# Patient Record
Sex: Female | Born: 1945
Health system: Southern US, Community
[De-identification: ages and names within clinical notes are randomized; demographics above are authoritative.]

## PROBLEM LIST (undated history)

## (undated) DIAGNOSIS — E119 Type 2 diabetes mellitus without complications: Secondary | ICD-10-CM

## (undated) DIAGNOSIS — R32 Unspecified urinary incontinence: Secondary | ICD-10-CM

## (undated) DIAGNOSIS — F79 Unspecified intellectual disabilities: Secondary | ICD-10-CM

## (undated) DIAGNOSIS — K5900 Constipation, unspecified: Secondary | ICD-10-CM

## (undated) DIAGNOSIS — R296 Repeated falls: Secondary | ICD-10-CM

## (undated) DIAGNOSIS — M8580 Other specified disorders of bone density and structure, unspecified site: Secondary | ICD-10-CM

## (undated) DIAGNOSIS — R159 Full incontinence of feces: Secondary | ICD-10-CM

## (undated) DIAGNOSIS — R569 Unspecified convulsions: Secondary | ICD-10-CM

## (undated) DIAGNOSIS — R269 Unspecified abnormalities of gait and mobility: Secondary | ICD-10-CM

## (undated) DIAGNOSIS — M1990 Unspecified osteoarthritis, unspecified site: Secondary | ICD-10-CM

## (undated) DIAGNOSIS — C801 Malignant (primary) neoplasm, unspecified: Secondary | ICD-10-CM

## (undated) HISTORY — DX: Unspecified convulsions: R56.9

## (undated) HISTORY — PX: ABDOMINAL HYSTERECTOMY: SHX81

## (undated) HISTORY — DX: Unspecified osteoarthritis, unspecified site: M19.90

## (undated) HISTORY — DX: Type 2 diabetes mellitus without complications: E11.9

## (undated) HISTORY — DX: Repeated falls: R29.6

## (undated) HISTORY — DX: Malignant (primary) neoplasm, unspecified: C80.1

---

## 1997-07-06 ENCOUNTER — Ambulatory Visit (HOSPITAL_BASED_OUTPATIENT_CLINIC_OR_DEPARTMENT_OTHER): Admission: RE | Admit: 1997-07-06 | Discharge: 1997-07-06 | Payer: Self-pay | Admitting: General Surgery

## 1999-07-22 ENCOUNTER — Encounter: Admission: RE | Admit: 1999-07-22 | Discharge: 1999-07-22 | Payer: Self-pay | Admitting: Gynecology

## 1999-08-27 ENCOUNTER — Encounter: Payer: Self-pay | Admitting: Family Medicine

## 1999-08-27 ENCOUNTER — Encounter: Admission: RE | Admit: 1999-08-27 | Discharge: 1999-08-27 | Payer: Self-pay | Admitting: Family Medicine

## 2012-04-16 ENCOUNTER — Other Ambulatory Visit: Payer: Self-pay | Admitting: Family Medicine

## 2012-04-16 DIAGNOSIS — Z1231 Encounter for screening mammogram for malignant neoplasm of breast: Secondary | ICD-10-CM

## 2012-05-21 ENCOUNTER — Ambulatory Visit: Payer: Self-pay

## 2012-06-21 ENCOUNTER — Ambulatory Visit: Payer: Self-pay

## 2012-07-12 ENCOUNTER — Ambulatory Visit: Payer: Self-pay

## 2013-01-07 ENCOUNTER — Encounter (INDEPENDENT_AMBULATORY_CARE_PROVIDER_SITE_OTHER): Payer: Self-pay

## 2013-01-07 ENCOUNTER — Encounter (INDEPENDENT_AMBULATORY_CARE_PROVIDER_SITE_OTHER): Payer: Self-pay | Admitting: General Surgery

## 2013-01-07 ENCOUNTER — Ambulatory Visit (INDEPENDENT_AMBULATORY_CARE_PROVIDER_SITE_OTHER): Payer: Medicare Other | Admitting: General Surgery

## 2013-01-07 VITALS — BP 108/77 | Temp 98.7°F | Ht <= 58 in | Wt 113.6 lb

## 2013-01-07 DIAGNOSIS — N6320 Unspecified lump in the left breast, unspecified quadrant: Secondary | ICD-10-CM

## 2013-01-07 DIAGNOSIS — N63 Unspecified lump in unspecified breast: Secondary | ICD-10-CM

## 2013-01-07 NOTE — Patient Instructions (Signed)
Will need to talk to the guardian and decide how to proceed further.

## 2013-01-07 NOTE — Progress Notes (Addendum)
Patient ID: Paula GUIRGUIS, female   DOB: 1945-10-27, 67 y.o.   MRN: [id number]  Chief Complaint  Patient presents with  . New Evaluation    eval lt br    HPI Paula Cantu is a 67 y.o. female.   HPI   She is referred by Dr. Katina Dung for further evaluation of a new left breast mass. She has severe profound mental retardation and lives in [facility]. She is here with 2 aides. A report that she had a physical in August and this mass was not noted. She was getting a bath and this was noticed at that time. They report that she will not lie down for an examination. She would not allow herself to be stuck within the needles. She will not be able to have a mammogram. She has a guardian who lives in [place]. She has a history of endometrial cancer. She has been on tamoxifen and Megace in the past but is is not clear to me why.  Given her medical condition, and no other history is obtainable.  Past Medical History  Diagnosis Date  . Arthritis   . Diabetes mellitus without complication   . Cancer     endometrial  . Seizures      Intermittent explosive disorder   Severe/profound mental retardation   Chronic constipation   Osteoarthritis  Past Surgical History  Procedure Laterality Date  . Abdominal hysterectomy      History reviewed. No pertinent family history.  Social History History  Substance Use Topics  . Smoking status: Never Smoker   . Smokeless tobacco: Never Used  . Alcohol Use: No    Not on File  Current Outpatient Prescriptions  Medication Sig Dispense Refill  . calcium carbonate (OS-CAL - DOSED IN MG OF ELEMENTAL CALCIUM) 1250 MG tablet Take 1 tablet by mouth.      . loratadine (CLARITIN) 10 MG tablet Take 10 mg by mouth daily.      . metFORMIN (GLUCOPHAGE) 850 MG tablet Take 850 mg by mouth daily with breakfast.      . senna (SENEXON) 8.6 MG tablet Take 1 tablet by mouth daily.       No current facility-administered medications for this visit.     Review of Systems Review of Systems  Unable to perform ROS   Blood pressure 108/77, temperature 98.7 F (37.1 C), temperature source Temporal, height 4' 8.5" (1.435 m), weight 113 lb 9.6 oz (51.529 kg).  Physical Exam Physical Exam  Constitutional: She appears well-developed and well-nourished.  Very agitated but pleasant.  HENT:  Head: Normocephalic and atraumatic.  Neck: Neck supple.  Pulmonary/Chest:  Left breast demonstrates a 5 cm x 5 cm firm mass in the superior aspect. It is relatively fixed. No erythema over it. A limited ultrasound suggest a solid mass. No nipple discharge.  Right breast is without masses.  Musculoskeletal:  No supraclavicular or axillary adenopathy.  Lymphadenopathy:    She has no cervical adenopathy.    Data Reviewed One note from a physician in March of this year.  Assessment    New onset large fixed left breast mass clinically suspicious for malignancy. The patient will not allow blood draws, would not allow a needle biopsy, and we'll not allow a mammogram. This is due to her mental status. This lead to a very difficult situation. Ideally, she would have a mammogram with ultrasound image guided biopsy. Further treatment will be based on that.     Plan  I am going to speak with her guardian in [place] and discussed the situation with her. We will try to obtain records from her oncologist in [place]. Also try to have a discussion with the person as well. Further evaluation and treatment based on above discussions.        Justeen Hehr J 01/07/2013, 4:32 PM

## 2013-01-20 ENCOUNTER — Encounter (INDEPENDENT_AMBULATORY_CARE_PROVIDER_SITE_OTHER): Payer: Self-pay | Admitting: General Surgery

## 2013-01-20 NOTE — Progress Notes (Signed)
Patient ID: Paula Cantu, female   DOB: 26-Oct-1945, 67 y.o.   MRN: [id number] Either I. Nor the step of the care center have been able to hold of the guardian of Ms. Newville. A certified letter has been sent to her address but no response has been received as of yet. If we cannot find the guardian, I will speak with the social worker of [place] to see if we need to get a guardian locally so that we can proceed with treatment.

## 2013-01-25 ENCOUNTER — Encounter (INDEPENDENT_AMBULATORY_CARE_PROVIDER_SITE_OTHER): Payer: Self-pay | Admitting: General Surgery

## 2013-01-25 ENCOUNTER — Telehealth (INDEPENDENT_AMBULATORY_CARE_PROVIDER_SITE_OTHER): Payer: Self-pay

## 2013-01-25 NOTE — Progress Notes (Signed)
Patient ID: Paula Cantu, female   DOB: 04/12/1945, 67 y.o.   MRN: [id number] I was able to get a hold of Evangeline Dakin, her guardian. I discussed the situation with her in detail. She gave me some background on [name]. She feels that she will not tolerate [place] doing a whole lot to her. I recommended  a lumpectomy and then possibly endocrine therapy if this was breast cancer that was ER PR positive. I discussed the procedure and the risks with her. Risks include but limited to bleeding, infection, wound healing problems from anesthesia, possibility of positive margins. She lives in  [place] and is taking care of her son who has lung cancer. She has not seen [name] in 2 years. I told her I let her think about it and then I will call her back to see if she elects to proceed. If so we will need to arrange for a phone/verbal consent to be done.

## 2013-01-25 NOTE — Telephone Encounter (Signed)
Rhonda at care facility called to give updated guardian info to Dr Abbey Chatters. Pt's guardian is Evangeline Dakin and can be reached at [phone]. She states Ms Dan Humphreys is aware MD will be calling her. I advised Bjorn Loser this info will be sent to Dr Abbey Chatters and his assistant.

## 2013-01-27 ENCOUNTER — Telehealth (INDEPENDENT_AMBULATORY_CARE_PROVIDER_SITE_OTHER): Payer: Self-pay

## 2013-01-27 NOTE — Telephone Encounter (Signed)
Per Warren Lacy, Risk Management at [facility], verbal consent can be obtained from pt's guardian in [place] by telephone for pt's surgery.  Dr. Abbey Chatters and one witness listening in is required.  Telephone call to be documented in Epic.

## 2013-01-28 ENCOUNTER — Telehealth (INDEPENDENT_AMBULATORY_CARE_PROVIDER_SITE_OTHER): Payer: Self-pay

## 2013-01-28 NOTE — Telephone Encounter (Signed)
Made pt's guardian, Evangeline Dakin, aware of pt's appt 02/16/13 with Dr. Abbey Chatters.  She will be available by phone from 11:00am until 12:00pm our time [place]) to give verbal consent for pt's breast surgery.  All her questions will be answered at that time.

## 2013-02-16 ENCOUNTER — Encounter (INDEPENDENT_AMBULATORY_CARE_PROVIDER_SITE_OTHER): Payer: Self-pay | Admitting: General Surgery

## 2013-02-16 ENCOUNTER — Ambulatory Visit (INDEPENDENT_AMBULATORY_CARE_PROVIDER_SITE_OTHER): Payer: Medicare Other | Admitting: General Surgery

## 2013-02-16 ENCOUNTER — Telehealth (INDEPENDENT_AMBULATORY_CARE_PROVIDER_SITE_OTHER): Payer: Self-pay | Admitting: General Surgery

## 2013-02-16 ENCOUNTER — Encounter (INDEPENDENT_AMBULATORY_CARE_PROVIDER_SITE_OTHER): Payer: Self-pay

## 2013-02-16 VITALS — HR 60 | Temp 97.8°F | Resp 18 | Ht 59.0 in | Wt 116.2 lb

## 2013-02-16 DIAGNOSIS — N63 Unspecified lump in unspecified breast: Secondary | ICD-10-CM | POA: Insufficient documentation

## 2013-02-16 NOTE — Patient Instructions (Signed)
We will call to schedule the operation.

## 2013-02-16 NOTE — Progress Notes (Signed)
Patient ID: Paula Cantu, female   DOB: [birth year], 67 y.o.   MRN: [id number]  Chief Complaint  Patient presents with  . Breast Problem    HPI Paula Cantu is a 67 y.o. female.   HPI  She is here for followup of her left breast mass. She is with a caretaker from the [facility] facility.  Past Medical History  Diagnosis Date  . Arthritis   . Diabetes mellitus without complication   . Cancer     endometrial  . Seizures     Past Surgical History  Procedure Laterality Date  . Abdominal hysterectomy      History reviewed. No pertinent family history.  Social History History  Substance Use Topics  . Smoking status: Never Smoker   . Smokeless tobacco: Never Used  . Alcohol Use: No    Allergies  Allergen Reactions  . Bacillus   . Lactose Intolerance (Gi)   . Ppd [Tuberculin Purified Protein Derivative]     Current Outpatient Prescriptions  Medication Sig Dispense Refill  . calcium carbonate (OS-CAL - DOSED IN MG OF ELEMENTAL CALCIUM) 1250 MG tablet Take 1 tablet by mouth.      . loratadine (CLARITIN) 10 MG tablet Take 10 mg by mouth daily.      . metFORMIN (GLUCOPHAGE) 850 MG tablet Take 850 mg by mouth daily with breakfast.      . senna (SENEXON) 8.6 MG tablet Take 1 tablet by mouth daily.       No current facility-administered medications for this visit.    Review of Systems Review of Systems  Unable to perform ROS   Pulse 60, temperature 97.8 F (36.6 C), temperature source Temporal, resp. rate 18, height 4\' 11"  (1.499 m), weight 116 lb 3.2 oz (52.708 kg).  Physical Exam Physical Exam  Constitutional:  She appears very agitated.  Cardiovascular: Regular rhythm.   Increased rate  Pulmonary/Chest:  5 cm slightly mobile superior left breast mass.  Musculoskeletal:  No left axillary adenopathy, no left supraclavicular adenopathy    Data Reviewed Old  Assessment    Left breast mass. I've recommended removal of this.  I spoke with her guardian,  Evangeline Dakin, while we were in the room and the presence of the caretaker and Britt Bottom, [place] student.  We discussed the procedure and risks once again and she gave her consent over the phone.  This was witnessed by the other 2 parties in the room.    Plan    Schedule outpatient left breast lumpectomy. This may be a challenge from an anesthesia standpoint.        Agapito Hanway J 02/16/2013, 12:43 PM

## 2013-02-16 NOTE — Telephone Encounter (Signed)
Received call from Judeen Hammans, former case Production designer, theatre/television/film and nurse for this pt.  She has researched pt's records for surgery report from her hysterectomy in 2006.  This nurse accompanied her pt to the preop holding area, because of the pt's degree of anxiety.  The record shows she was given chloral hydrate then, and soon later 10 mg Valium, followed finally by a "ketamine cocktail."  Pt finally became totally sedated and surgery with endotrachial general anesthesia proceeded.  Bjorn Loser will try to scan and email this report, but the condition of it is too poor to FAX.

## 2013-02-17 ENCOUNTER — Encounter (HOSPITAL_COMMUNITY): Payer: Self-pay | Admitting: Pharmacy Technician

## 2013-02-24 ENCOUNTER — Encounter (HOSPITAL_COMMUNITY): Payer: Self-pay | Admitting: Vascular Surgery

## 2013-02-24 NOTE — Progress Notes (Signed)
Anesthesia Chart Review:  Patient is a 67 year old female scheduled for left breast lumpectomy on 03/04/13 by Dr. Abbey Chatters.  Breast lump is worrisome for cancer, although it has yet to be proven.  Patient is scheduled to be a same day work-up.  Her history includes severe intellectual disability, non-verbal, DM2, endometrial cancer s/p hysterectomy with BSO 2008 [facility]), arthritis, remote history of seizure (possible childhood febrile seizure; not requiring long-term anti-seizure medication). BMI is listed as 23.46, 52.7 kg as of 02/16/13. Patient is fearful and uncooperative of procedures.  Notes indicate that she was not willing to lie down for an exam, undergo a mammogram, and is particularly uncooperative with needle sticks.  She is a resident at [facility] in [place].  PCP is Dr. Katina Dung.  Her legal guardian is Evangeline Dakin [phone]) who is a cousin that lives in [place].  (Copy of [place] guardianship letter of appointment received from [facility].) Today I spoke with one of patient's nurses Bjorn Loser, who has been involved in her care for the past ten years (first as a CSW and now as a Engineer, civil (consulting)).  Bjorn Loser reports that patient's glucose is checked twice weekly and typically runs 100-150.  There is no known cardiac issues.  No seizures since she has known patient.    I am requesting her 2008 anesthesia record, but according to the operative note, she was given chloral hydrate then Valium 10 mg followed by a "ketamine cocktail." Once she was sedated enough an IV was started, appropriate labs drawn, and CXR, and EKG were done. She then was taken to the operating room for procedure under general anesthesia.    Anesthesiologist Dr. Jacklynn Bue spoke with Evangeline Dakin over the phone this afternoon for anesthesia consent.  Two nurses were also able to verify surgical consent.  Evangeline Dakin stated to please feel free to contact her at any time for questions.  Velna Ochs [facility]/Anesthesiology Phone [phone] 02/24/2013 3:31 PM

## 2013-03-03 ENCOUNTER — Encounter (HOSPITAL_COMMUNITY): Payer: Self-pay | Admitting: *Deleted

## 2013-03-03 MED ORDER — CEFAZOLIN SODIUM-DEXTROSE 2-3 GM-% IV SOLR
2.0000 g | INTRAVENOUS | Status: AC
  Administered 2013-03-04: 2 g via INTRAVENOUS
  Filled 2013-03-03: qty 50

## 2013-03-03 NOTE — Progress Notes (Signed)
Spoke with Leandro Reasoner, nurse for Ms. Capetillo. Pt is mentally challenged and unable to speak for herself. Roxanne verified health hx, allergies and meds. She states that pt is usually not cooperative with medical procedures and the group home will be sending several people to help with her. OR consent has been signed previously by guardian, it is on pt's chart. Informed Roxanne that pt needed to be here at 5:30 am tomorrow (12/19) and that she is to be NPO after midnight tonight, but may have her Claritin with a little sip of water in the am before surgery. Pt not to take any other medications in the am.

## 2013-03-04 ENCOUNTER — Encounter (HOSPITAL_COMMUNITY): Payer: Self-pay | Admitting: Anesthesiology

## 2013-03-04 ENCOUNTER — Encounter (HOSPITAL_COMMUNITY)
Admission: RE | Disposition: A | Discharge status: Home / Self Care | Payer: Self-pay | Source: Ambulatory Visit | Attending: General Surgery

## 2013-03-04 ENCOUNTER — Ambulatory Visit (HOSPITAL_COMMUNITY): Payer: Medicare Other | Admitting: Certified Registered Nurse Anesthetist

## 2013-03-04 ENCOUNTER — Ambulatory Visit (HOSPITAL_COMMUNITY)
Admission: RE | Admit: 2013-03-04 | Discharge: 2013-03-04 | Disposition: A | Discharge status: Home / Self Care | Payer: Medicare Other | Source: Ambulatory Visit | Attending: General Surgery | Admitting: General Surgery

## 2013-03-04 ENCOUNTER — Encounter (HOSPITAL_COMMUNITY): Payer: Medicare Other | Admitting: Certified Registered Nurse Anesthetist

## 2013-03-04 DIAGNOSIS — C50919 Malignant neoplasm of unspecified site of unspecified female breast: Secondary | ICD-10-CM

## 2013-03-04 DIAGNOSIS — E119 Type 2 diabetes mellitus without complications: Secondary | ICD-10-CM | POA: Insufficient documentation

## 2013-03-04 DIAGNOSIS — Z8542 Personal history of malignant neoplasm of other parts of uterus: Secondary | ICD-10-CM | POA: Insufficient documentation

## 2013-03-04 DIAGNOSIS — Z9079 Acquired absence of other genital organ(s): Secondary | ICD-10-CM | POA: Insufficient documentation

## 2013-03-04 DIAGNOSIS — F72 Severe intellectual disabilities: Secondary | ICD-10-CM | POA: Insufficient documentation

## 2013-03-04 HISTORY — PX: BREAST LUMPECTOMY: SHX2

## 2013-03-04 HISTORY — DX: Unspecified intellectual disabilities: F79

## 2013-03-04 HISTORY — DX: Other specified disorders of bone density and structure, unspecified site: M85.80

## 2013-03-04 LAB — GLUCOSE, CAPILLARY: Glucose-Capillary: 115 mg/dL — ABNORMAL HIGH (ref 70–99)

## 2013-03-04 SURGERY — BREAST LUMPECTOMY
Anesthesia: General | Laterality: Left

## 2013-03-04 MED ORDER — 0.9 % SODIUM CHLORIDE (POUR BTL) OPTIME
TOPICAL | Status: DC | PRN
  Administered 2013-03-04: 1000 mL

## 2013-03-04 MED ORDER — HYDROCODONE-ACETAMINOPHEN 5-325 MG PO TABS: 1.0000 | ORAL_TABLET | ORAL | Status: DC | PRN

## 2013-03-04 MED ORDER — LIDOCAINE HCL (CARDIAC) 20 MG/ML IV SOLN
INTRAVENOUS | Status: DC | PRN
  Administered 2013-03-04: 70 mg via INTRAVENOUS

## 2013-03-04 MED ORDER — KETAMINE HCL 100 MG/ML IJ SOLN
INTRAMUSCULAR | Status: AC
  Filled 2013-03-04: qty 1

## 2013-03-04 MED ORDER — HYDROMORPHONE HCL PF 1 MG/ML IJ SOLN: 0.2500 mg | INTRAMUSCULAR | Status: DC | PRN

## 2013-03-04 MED ORDER — PHENYLEPHRINE HCL 10 MG/ML IJ SOLN
INTRAMUSCULAR | Status: DC | PRN
  Administered 2013-03-04: 120 ug via INTRAVENOUS

## 2013-03-04 MED ORDER — BUPIVACAINE HCL (PF) 0.25 % IJ SOLN
INTRAMUSCULAR | Status: AC
  Filled 2013-03-04: qty 30

## 2013-03-04 MED ORDER — ONDANSETRON HCL 4 MG/2ML IJ SOLN
INTRAMUSCULAR | Status: DC | PRN
  Administered 2013-03-04: 4 mg via INTRAVENOUS

## 2013-03-04 MED ORDER — ONDANSETRON HCL 4 MG/2ML IJ SOLN
4.0000 mg | Freq: Once | INTRAMUSCULAR | Status: DC | PRN
Start: 2013-03-04 — End: 2013-03-04

## 2013-03-04 MED ORDER — OXYCODONE HCL 5 MG/5ML PO SOLN: 5.0000 mg | Freq: Once | ORAL | Status: DC | PRN

## 2013-03-04 MED ORDER — OXYCODONE HCL 5 MG PO TABS: 5.0000 mg | ORAL_TABLET | Freq: Once | ORAL | Status: DC | PRN

## 2013-03-04 MED ORDER — PROPOFOL 10 MG/ML IV BOLUS
INTRAVENOUS | Status: DC | PRN
  Administered 2013-03-04: 130 mg via INTRAVENOUS

## 2013-03-04 MED ORDER — FENTANYL CITRATE 0.05 MG/ML IJ SOLN
INTRAMUSCULAR | Status: DC | PRN
  Administered 2013-03-04: 100 ug via INTRAVENOUS

## 2013-03-04 MED ORDER — BUPIVACAINE HCL (PF) 0.25 % IJ SOLN
INTRAMUSCULAR | Status: DC | PRN
  Administered 2013-03-04: 20 mL

## 2013-03-04 MED ORDER — KETAMINE HCL 100 MG/ML IJ SOLN
INTRAMUSCULAR | Status: DC | PRN
  Administered 2013-03-04: 250 mg via INTRAMUSCULAR

## 2013-03-04 MED ORDER — HYDROMORPHONE HCL PF 1 MG/ML IJ SOLN
INTRAMUSCULAR | Status: AC
  Filled 2013-03-04: qty 1

## 2013-03-04 MED ORDER — SUCCINYLCHOLINE CHLORIDE 20 MG/ML IJ SOLN
INTRAMUSCULAR | Status: DC | PRN
  Administered 2013-03-04: 120 mg via INTRAVENOUS

## 2013-03-04 MED ORDER — LACTATED RINGERS IV SOLN
INTRAVENOUS | Status: DC | PRN
  Administered 2013-03-04 (×2): via INTRAVENOUS

## 2013-03-04 MED ORDER — ONDANSETRON HCL 4 MG/2ML IJ SOLN: 4.0000 mg | Freq: Once | INTRAMUSCULAR | Status: DC | PRN

## 2013-03-04 SURGICAL SUPPLY — 44 items
BENZOIN TINCTURE PRP APPL 2/3 (GAUZE/BANDAGES/DRESSINGS) ×2 IMPLANT
BINDER BREAST LRG (GAUZE/BANDAGES/DRESSINGS) IMPLANT
BINDER BREAST XLRG (GAUZE/BANDAGES/DRESSINGS) IMPLANT
BLADE SURG 10 STRL SS (BLADE) ×2 IMPLANT
BLADE SURG 15 STRL LF DISP TIS (BLADE) ×1 IMPLANT
BLADE SURG 15 STRL SS (BLADE) ×1
CANISTER SUCTION 2500CC (MISCELLANEOUS) ×2 IMPLANT
CHLORAPREP W/TINT 26ML (MISCELLANEOUS) ×2 IMPLANT
CONT SPEC 4OZ CLIKSEAL STRL BL (MISCELLANEOUS) ×2 IMPLANT
COVER SURGICAL LIGHT HANDLE (MISCELLANEOUS) ×2 IMPLANT
DECANTER SPIKE VIAL GLASS SM (MISCELLANEOUS) ×2 IMPLANT
DRAPE PED LAPAROTOMY (DRAPES) ×2 IMPLANT
DRAPE UTILITY 15X26 W/TAPE STR (DRAPE) ×4 IMPLANT
DRSG OPSITE 4X5.5 SM (GAUZE/BANDAGES/DRESSINGS) ×2 IMPLANT
ELECT CAUTERY BLADE 6.4 (BLADE) ×2 IMPLANT
ELECT REM PT RETURN 9FT ADLT (ELECTROSURGICAL) ×2
ELECTRODE REM PT RTRN 9FT ADLT (ELECTROSURGICAL) ×1 IMPLANT
GLOVE BIOGEL PI IND STRL 8 (GLOVE) ×1 IMPLANT
GLOVE BIOGEL PI INDICATOR 8 (GLOVE) ×1
GLOVE ECLIPSE 8.0 STRL XLNG CF (GLOVE) ×2 IMPLANT
GLOVE SURG SS PI 7.0 STRL IVOR (GLOVE) ×4 IMPLANT
GOWN STRL NON-REIN LRG LVL3 (GOWN DISPOSABLE) ×4 IMPLANT
KIT BASIN OR (CUSTOM PROCEDURE TRAY) ×2 IMPLANT
KIT ROOM TURNOVER OR (KITS) ×2 IMPLANT
NEEDLE 18GX1X1/2 (RX/OR ONLY) (NEEDLE) IMPLANT
NEEDLE HYPO 25GX1X1/2 BEV (NEEDLE) ×2 IMPLANT
NS IRRIG 1000ML POUR BTL (IV SOLUTION) ×2 IMPLANT
PACK SURGICAL SETUP 50X90 (CUSTOM PROCEDURE TRAY) ×2 IMPLANT
PAD ARMBOARD 7.5X6 YLW CONV (MISCELLANEOUS) ×4 IMPLANT
PENCIL BUTTON HOLSTER BLD 10FT (ELECTRODE) ×2 IMPLANT
SPONGE GAUZE 4X4 12PLY (GAUZE/BANDAGES/DRESSINGS) ×2 IMPLANT
SPONGE LAP 18X18 X RAY DECT (DISPOSABLE) ×2 IMPLANT
SPONGE LAP 4X18 X RAY DECT (DISPOSABLE) ×2 IMPLANT
STRIP CLOSURE SKIN 1/2X4 (GAUZE/BANDAGES/DRESSINGS) ×2 IMPLANT
SUT MNCRL AB 4-0 PS2 18 (SUTURE) ×2 IMPLANT
SUT SILK 3 0 SH 30 (SUTURE) ×2 IMPLANT
SUT VIC AB 3-0 SH 18 (SUTURE) ×2 IMPLANT
SYR CONTROL 10ML LL (SYRINGE) ×2 IMPLANT
TAPE CLOTH SURG 4X10 WHT LF (GAUZE/BANDAGES/DRESSINGS) ×2 IMPLANT
TOWEL OR 17X24 6PK STRL BLUE (TOWEL DISPOSABLE) ×2 IMPLANT
TOWEL OR 17X26 10 PK STRL BLUE (TOWEL DISPOSABLE) ×2 IMPLANT
TUBE CONNECTING 12X1/4 (SUCTIONS) ×2 IMPLANT
WATER STERILE IRR 1000ML POUR (IV SOLUTION) IMPLANT
YANKAUER SUCT BULB TIP NO VENT (SUCTIONS) ×2 IMPLANT

## 2013-03-04 NOTE — H&P (View-Only) (Signed)
Patient ID: Paula Cantu, female   DOB: 11/16/1945, 67 y.o.   MRN: [id number]  Chief Complaint  Patient presents with  . Breast Problem    HPI Paula Cantu is a 67 y.o. female.   HPI  She is here for followup of her left breast mass. She is with a caretaker from the [facility] facility.  Past Medical History  Diagnosis Date  . Arthritis   . Diabetes mellitus without complication   . Cancer     endometrial  . Seizures     Past Surgical History  Procedure Laterality Date  . Abdominal hysterectomy      History reviewed. No pertinent family history.  Social History History  Substance Use Topics  . Smoking status: Never Smoker   . Smokeless tobacco: Never Used  . Alcohol Use: No    Allergies  Allergen Reactions  . Bacillus   . Lactose Intolerance (Gi)   . Ppd [Tuberculin Purified Protein Derivative]     Current Outpatient Prescriptions  Medication Sig Dispense Refill  . calcium carbonate (OS-CAL - DOSED IN MG OF ELEMENTAL CALCIUM) 1250 MG tablet Take 1 tablet by mouth.      . loratadine (CLARITIN) 10 MG tablet Take 10 mg by mouth daily.      . metFORMIN (GLUCOPHAGE) 850 MG tablet Take 850 mg by mouth daily with breakfast.      . senna (SENEXON) 8.6 MG tablet Take 1 tablet by mouth daily.       No current facility-administered medications for this visit.    Review of Systems Review of Systems  Unable to perform ROS   Pulse 60, temperature 97.8 F (36.6 C), temperature source Temporal, resp. rate 18, height 4' 11" (1.499 m), weight 116 lb 3.2 oz (52.708 kg).  Physical Exam Physical Exam  Constitutional:  She appears very agitated.  Cardiovascular: Regular rhythm.   Increased rate  Pulmonary/Chest:  5 cm slightly mobile superior left breast mass.  Musculoskeletal:  No left axillary adenopathy, no left supraclavicular adenopathy    Data Reviewed Old  Assessment    Left breast mass. I've recommended removal of this.  I spoke with her guardian,  Sandra Walker, while we were in the room and the presence of the caretaker and Jamie Seals, [employer] student.  We discussed the procedure and risks once again and she gave her consent over the phone.  This was witnessed by the other 2 parties in the room.    Plan    Schedule outpatient left breast lumpectomy. This may be a challenge from an anesthesia standpoint.        Remonia Otte J 02/16/2013, 12:43 PM    

## 2013-03-04 NOTE — Interval H&P Note (Signed)
History and Physical Interval Note:  03/04/2013 7:34 AM  Paula Cantu  has presented today for surgery, with the diagnosis of left breast mass  The various methods of treatment have been discussed with the patient and family. After consideration of risks, benefits and other options for treatment, the patient has consented to  Procedure(s): LEFT BREAST LUMPECTOMY (Left) as a surgical intervention .  The patient's history has been reviewed, patient examined, no change in status, stable for surgery.  I have reviewed the patient's chart and labs.  Questions were answered to the patient's satisfaction.     Darik Massing Shela Commons

## 2013-03-04 NOTE — Anesthesia Procedure Notes (Signed)
Procedure Name: Intubation Date/Time: 03/04/2013 7:53 AM Performed by: Whitman Hero Pre-anesthesia Checklist: Patient identified, Emergency Drugs available, Suction available and Patient being monitored Patient Re-evaluated:Patient Re-evaluated prior to inductionOxygen Delivery Method: Circle system utilized Preoxygenation: Pre-oxygenation with 100% oxygen Intubation Type: IV induction, Inhalational induction and Rapid sequence Ventilation: Mask ventilation without difficulty Laryngoscope Size: Mac and 3 Grade View: Grade I Tube type: Oral Tube size: 7.5 mm Number of attempts: 1 Airway Equipment and Method: Stylet Placement Confirmation: ETT inserted through vocal cords under direct vision,  breath sounds checked- equal and bilateral and positive ETCO2 Secured at: 20 cm Tube secured with: Tape Dental Injury: Teeth and Oropharynx as per pre-operative assessment  Future Recommendations: Recommend- induction with short-acting agent, and alternative techniques readily available

## 2013-03-04 NOTE — Anesthesia Postprocedure Evaluation (Signed)
  Anesthesia Post-op Note  Patient: Paula Cantu  Procedure(s) Performed: Procedure(s): LEFT BREAST LUMPECTOMY (Left)  Patient Location: PACU  Anesthesia Type:General  Level of Consciousness: awake, alert , patient cooperative and confused  Airway and Oxygen Therapy: Patient Spontanous Breathing and Patient connected to face mask oxygen  Post-op Pain: mild  Post-op Assessment: Post-op Vital signs reviewed  Post-op Vital Signs: Reviewed  Complications: No apparent anesthesia complications

## 2013-03-04 NOTE — Op Note (Signed)
Operative Note  Paula Cantu female 67 y.o. 03/04/2013  PREOPERATIVE DX:  Left breast mass  POSTOPERATIVE DX:  Same  PROCEDURE:  Excision of left breast mass         Surgeon: Adolph Pollack   Assistants: None  Anesthesia: General endotracheal anesthesia  Indications: This is a 67 year old female with severe mental retardation who was referred to me because of a large left breast mass noted on a physical examination. Because of her mental condition, imaging studies and needle biopsies are not able to be performed. The palpable mass is concerning for malignancy. After discussion with her guardian and caretakers we decided to proceed with the above operation.    Procedure Detail:  She was seen in the holding area in the left breast marked with my initial. She was brought to the operating room after being sedated, placed supine on the operating table, and a general anesthetic was given. The left breast was widely sterilely prepped and draped. A timeout was performed.  In the superior aspect of the left breast where the lesion was in incision was made in a transverse fashion. Subcutaneous flaps were raised all around the mass. The mass was very close to the skin thus an ellipse of skin was removed which would be the final anterior margin. I then used electrocautery to excise the mass and grossly get negative margins. This went down to the chest wall. Once the mass was removed it was oriented with silk sutures with a single suture being superior and a double suture being medial. The final anterior margin was sent for pathology. An ink mark was placed on the anterior tissue margin. The specimen was then sent for pathology.  Bleeding was controlled with electrocautery. The wound is then anesthetized with the Marcaine for local anesthetic effect. Irrigation was performed.  Second inspection of the wound demonstrated adequate hemostasis. The subcutaneous tissues and approximated with  interrupted 3-0 Vicryl sutures. The skin was closed with a running 4-0 Monocryl subcuticular stitch. Steri-Strips and sterile dressings were applied.  She tolerated the procedure well without any apparent complications and was taken to the recovery room in satisfactory condition.  Estimated Blood Loss:  100 ml         Drains: none  Blood Given: none          Specimens: Left breast tissue and skin for margin.        Complications:  * No complications entered in OR log *         Disposition: PACU - hemodynamically stable.         Condition: stable

## 2013-03-04 NOTE — Preoperative (Signed)
Beta Blockers   Reason not to administer Beta Blockers:Not Applicable 

## 2013-03-04 NOTE — Transfer of Care (Signed)
Immediate Anesthesia Transfer of Care Note  Patient: Paula Cantu  Procedure(s) Performed: Procedure(s): LEFT BREAST LUMPECTOMY (Left)  Patient Location: PACU  Anesthesia Type:General  Level of Consciousness: sedated and patient cooperative  Airway & Oxygen Therapy: Patient Spontanous Breathing  Post-op Assessment: Report given to PACU RN and Post -op Vital signs reviewed and stable  Post vital signs: Reviewed and stable  Complications: No apparent anesthesia complications

## 2013-03-04 NOTE — Anesthesia Preprocedure Evaluation (Signed)
Anesthesia Evaluation  Patient identified by MRN, date of birth, ID band Patient awake and Patient confused    Reviewed: Allergy & Precautions, H&P , NPO status , Patient's Chart, lab work & pertinent test results  Airway       Dental   Pulmonary          Cardiovascular     Neuro/Psych Seizures -,     GI/Hepatic   Endo/Other  diabetes, Type 2, Oral Hypoglycemic Agents  Renal/GU      Musculoskeletal   Abdominal   Peds  Hematology   Anesthesia Other Findings Severe mental retardation.  ? Hx of MR  Cannot examine pt due to her inability to cooperate.  Reproductive/Obstetrics                           Anesthesia Physical Anesthesia Plan  ASA: IV  Anesthesia Plan: General   Post-op Pain Management:    Induction: Intravenous and Inhalational  Airway Management Planned: LMA  Additional Equipment:   Intra-op Plan:   Post-operative Plan: Extubation in OR  Informed Consent: I have reviewed the patients History and Physical, chart, labs and discussed the procedure including the risks, benefits and alternatives for the proposed anesthesia with the patient or authorized representative who has indicated his/her understanding and acceptance.   Dental advisory given and Consent reviewed with POA  Plan Discussed with: CRNA, Anesthesiologist and Surgeon  Anesthesia Plan Comments: (Dr. Jacklynn Bue previously called for phone consent.)        Anesthesia Quick Evaluation

## 2013-03-07 ENCOUNTER — Telehealth (INDEPENDENT_AMBULATORY_CARE_PROVIDER_SITE_OTHER): Payer: Self-pay | Admitting: General Surgery

## 2013-03-07 NOTE — Telephone Encounter (Signed)
Spoke with her about Paula Cantu's operation.

## 2013-03-08 ENCOUNTER — Telehealth (INDEPENDENT_AMBULATORY_CARE_PROVIDER_SITE_OTHER): Payer: Self-pay

## 2013-03-08 ENCOUNTER — Encounter (HOSPITAL_COMMUNITY): Payer: Self-pay | Admitting: General Surgery

## 2013-03-08 NOTE — Telephone Encounter (Signed)
Patient Niece Dois Davenport) returning Dr. Arne Cleveland phone call regarding pathology results.  Dois Davenport was made aware that patients margins are clear.

## 2013-03-14 ENCOUNTER — Telehealth (INDEPENDENT_AMBULATORY_CARE_PROVIDER_SITE_OTHER): Payer: Self-pay | Admitting: General Surgery

## 2013-03-14 NOTE — Telephone Encounter (Signed)
Joyce Gross, at [employer], calling for this pt's pathology report.  Stated that Dr. Abbey Chatters told them he would be on vacation this week, but the report would be back for review.  She asked for the report to be FAXd to Attn:  Dr. Dayna Barker at [phone]; done.

## 2013-03-24 ENCOUNTER — Encounter (INDEPENDENT_AMBULATORY_CARE_PROVIDER_SITE_OTHER): Payer: Self-pay | Admitting: General Surgery

## 2013-03-24 ENCOUNTER — Ambulatory Visit (INDEPENDENT_AMBULATORY_CARE_PROVIDER_SITE_OTHER): Payer: Medicare Other | Admitting: General Surgery

## 2013-03-24 ENCOUNTER — Other Ambulatory Visit (INDEPENDENT_AMBULATORY_CARE_PROVIDER_SITE_OTHER): Payer: Self-pay

## 2013-03-24 VITALS — Wt 111.6 lb

## 2013-03-24 DIAGNOSIS — C50912 Malignant neoplasm of unspecified site of left female breast: Secondary | ICD-10-CM

## 2013-03-24 DIAGNOSIS — C50919 Malignant neoplasm of unspecified site of unspecified female breast: Secondary | ICD-10-CM

## 2013-03-24 NOTE — Patient Instructions (Signed)
Follow up visit in 3 months. 

## 2013-03-24 NOTE — Progress Notes (Signed)
Procedure:  Left partial mastectomy  Date:  03/04/2013  Pathology:  Invasive breast cancer, margins clear, ER/PR positive, proliferation rate 16%  History:  She is here for her first postoperative visit. I tried to contact her guardian, Ms. Walker, to give her the final results. I have not been able to personally speak with her.  Exam: General- Is in NAD. Left breast-incision is clean and intact. Steri-Strips are present there  Assessment:  Invasive left breast cancer. Given her other situations I do not think she needs any further surgery. I do feel she could benefit from a medical oncology consult and discussion of hormonal therapy.  Plan:  Referral to medical oncology. Return visit 3 months.

## 2013-03-25 ENCOUNTER — Encounter: Payer: Self-pay | Admitting: *Deleted

## 2013-03-25 NOTE — Progress Notes (Signed)
Received referral in EPIC for pt to see Med Onc.  Took paperwork to Dr. Jana Hakim for an appt.  Emailed Arts development officer at [employer] to make her aware.

## 2013-03-28 ENCOUNTER — Telehealth: Payer: Self-pay | Admitting: *Deleted

## 2013-03-28 NOTE — Telephone Encounter (Signed)
Received date and time from Dr. Jana Hakim.  Called and left a message for pt or caregiver to call me back so I can get her scheduled.

## 2013-03-29 ENCOUNTER — Other Ambulatory Visit: Payer: Self-pay | Admitting: Oncology

## 2013-03-29 ENCOUNTER — Telehealth: Payer: Self-pay | Admitting: *Deleted

## 2013-03-29 NOTE — Telephone Encounter (Signed)
Spoke with Zigmund Daniel pts caregiver that the group home pts lives at and confirmed 04/13/13 appt.  Unable to schedule the appt from Dr. Jana Hakim - emailed Audie Clear to enter it for me.  Zigmund Daniel made me aware that this pt will require some special assistance during this visit and she will have 2 nurses with her.  Pt is not going to be able to have her labs drawn here, so requested for me to fax an order to their facility and they will take care of the labs and bring results the day of the visit.  Pt has White Coat Syndrome and cannot be in an exam room as well.  I have placed a note in EPIC to make registration or FA aware of situation and will make everyone in [facility] aware as well.  I will request to see if Dr. Jana Hakim will be willing to see the patient in our family room and bring her back to it as soon as she gets checked in.  I am faxing the before appt letter, welcome packet, intake form to nurse at group home.  I will get with Dr. Jana Hakim for the lab order and then fax it.

## 2013-03-30 ENCOUNTER — Encounter: Payer: Self-pay | Admitting: *Deleted

## 2013-03-30 NOTE — Progress Notes (Signed)
Emailed Michelle at [employer] to make her aware of the appt.  Got paperwork ready to fax.  Gave paperwork to Dr. Jana Hakim for an order for the labs and I will fax all paperwork to the nurses when I get that back.  Taking other paperwork to [facility] for chart.

## 2013-03-31 ENCOUNTER — Encounter: Payer: Self-pay | Admitting: *Deleted

## 2013-03-31 NOTE — Progress Notes (Signed)
Faxed Lab order, before appt letter, welcome packet & intake form to nursing for pt.  Took paperwork to [facility] for chart.

## 2013-04-02 ENCOUNTER — Encounter (HOSPITAL_COMMUNITY): Payer: Self-pay | Admitting: Emergency Medicine

## 2013-04-02 ENCOUNTER — Emergency Department (HOSPITAL_COMMUNITY)
Admission: EM | Admit: 2013-04-02 | Discharge: 2013-04-02 | Disposition: A | Discharge status: Home Health | Payer: Medicare Other | Attending: Emergency Medicine | Admitting: Emergency Medicine

## 2013-04-02 DIAGNOSIS — T819XXA Unspecified complication of procedure, initial encounter: Secondary | ICD-10-CM

## 2013-04-02 DIAGNOSIS — F79 Unspecified intellectual disabilities: Secondary | ICD-10-CM | POA: Insufficient documentation

## 2013-04-02 DIAGNOSIS — E119 Type 2 diabetes mellitus without complications: Secondary | ICD-10-CM | POA: Insufficient documentation

## 2013-04-02 DIAGNOSIS — M129 Arthropathy, unspecified: Secondary | ICD-10-CM | POA: Insufficient documentation

## 2013-04-02 DIAGNOSIS — M899 Disorder of bone, unspecified: Secondary | ICD-10-CM | POA: Insufficient documentation

## 2013-04-02 DIAGNOSIS — R569 Unspecified convulsions: Secondary | ICD-10-CM | POA: Insufficient documentation

## 2013-04-02 DIAGNOSIS — Z79899 Other long term (current) drug therapy: Secondary | ICD-10-CM | POA: Insufficient documentation

## 2013-04-02 DIAGNOSIS — M949 Disorder of cartilage, unspecified: Secondary | ICD-10-CM

## 2013-04-02 DIAGNOSIS — S2000XA Contusion of breast, unspecified breast, initial encounter: Secondary | ICD-10-CM | POA: Insufficient documentation

## 2013-04-02 DIAGNOSIS — C801 Malignant (primary) neoplasm, unspecified: Secondary | ICD-10-CM | POA: Insufficient documentation

## 2013-04-02 DIAGNOSIS — Y838 Other surgical procedures as the cause of abnormal reaction of the patient, or of later complication, without mention of misadventure at the time of the procedure: Secondary | ICD-10-CM | POA: Insufficient documentation

## 2013-04-02 NOTE — ED Notes (Signed)
Unable to obtain pt temp. Pt hitting and screaming when atttempted. Pt skin warm and dry. Care giver also atttempted to obtain temp without success

## 2013-04-02 NOTE — ED Notes (Signed)
Very agitated now, hx mental retardation, companion sitter at bedside with her. Pt will not allow staff to touch her, refused putting on her clothing.she will allow staff to take her vital sign. Noted left breat discoloration and edema.  Will allow staff to assess her.

## 2013-04-02 NOTE — ED Notes (Signed)
Pt would not cooperate long enough to get vital signs

## 2013-04-02 NOTE — ED Notes (Signed)
Caregiver states pt is acting normally. Understands to bring pt back if worsening

## 2013-04-02 NOTE — ED Notes (Signed)
Provider at bedside.  Pt difficult to assess. Sitter at bedside. Pt has steri strips to left breast that are peeling. Bruising noted to left lower breast, no drainage noted.  Incision line slightly red, does not show s/s of tenderness when palpated

## 2013-04-02 NOTE — ED Notes (Signed)
Unable to obtain accurate vitals due to non-cooperative patient.

## 2013-04-02 NOTE — ED Provider Notes (Signed)
CSN: [id number]     Arrival date & time 04/02/13  1251 History   First MD Initiated Contact with Patient 04/02/13 1503     Chief Complaint  Patient presents with  . Breast Problem   (Consider location/radiation/quality/duration/timing/severity/associated sxs/prior Treatment) HPI Comments: Patient with history of MR, recent excision of breast mass 03/02/13 by Dr. Zella Richer -- presents from her facility with acute change in surgical site. Level V caveat due to MR. Area noted to be discolored and more firm today. No drainage, fever, N/V per caregiver.    The history is provided by a caregiver and medical records.    Past Medical History  Diagnosis Date  . Arthritis   . Cancer     endometrial  . Intellectual disability     severe intellectual disability, non-verbal  . Seizures     remote history; possible febrile childhood seizure  . Diabetes mellitus without complication     takes metformin  . Osteopenia    Past Surgical History  Procedure Laterality Date  . Abdominal hysterectomy    . Breast lumpectomy Left 03/04/2013    Procedure: LEFT BREAST LUMPECTOMY;  Surgeon: [name], MD;  Location: [facility];  Service: General;  Laterality: Left;   No family history on file. History  Substance Use Topics  . Smoking status: Never Smoker   . Smokeless tobacco: Never Used  . Alcohol Use: No   OB History   Grav Para Term Preterm Abortions TAB SAB Ect Mult Living                 Review of Systems  Unable to perform ROS: Patient nonverbal    Allergies  Bacillus; Lactose intolerance (gi); and Ppd  Home Medications   Current Outpatient Rx  Name  Route  Sig  Dispense  Refill  . calcium-vitamin D (OSCAL 500/200 D-3) 500-200 MG-UNIT per tablet   Oral   Take 1 tablet by mouth 2 (two) times daily.         Marland Kitchen HYDROcodone-acetaminophen (NORCO/VICODIN) 5-325 MG per tablet   Oral   Take 1-2 tablets by mouth every 4 (four) hours as needed.   30 tablet   0   . loratadine  (CLARITIN) 10 MG tablet   Oral   Take 10 mg by mouth daily.         . metFORMIN (GLUCOPHAGE) 850 MG tablet   Oral   Take 850 mg by mouth daily with breakfast.         . sennosides-docusate sodium (SENOKOT-S) 8.6-50 MG tablet   Oral   Take 2 tablets by mouth daily.          BP 120/74  Pulse 84  Resp 18  SpO2 99% Physical Exam  Nursing note and vitals reviewed. Constitutional: She appears well-developed and well-nourished.  She does not feel warm or febrile. Patient is not cooperative with exam.   HENT:  Head: Normocephalic and atraumatic.  Eyes: Conjunctivae are normal.  Neck: Normal range of motion. Neck supple.  Cardiovascular: Normal rate.   HR approx 100 on exam.   Pulmonary/Chest: No respiratory distress. She exhibits no tenderness.  Surgical site L upper breast with steristrips intact, no active drainage, erythema or warmth. Area is firm to touch surrounding the site. Does not appear infected on exam.   Neurological: She is alert.  Skin: Skin is warm and dry.  Psychiatric: She has a normal mood and affect.    ED Course  Procedures (including critical care time)  Labs Review Labs Reviewed - No data to display Imaging Review No results found.  EKG Interpretation   None      3:04 PM Patient seen and examined. Work-up initiated. Medications ordered.   Vital signs unable to be performed because patient swings her arms and bats away anyone who tries to examine her.   Patient discussed with Dr. Dorna Mai. Will call surgery for reccs.   3:52 PM I spoke with Dr. Redmond Pulling of general surgery. Explained that site does not appear erythematous, is not warm, does not exhibit and drainage. Also patient shows no systemic symptoms of illness including feeling feverish. Asked if he wants to see patient in ED versus close follow-up in clinic next week. He states patient can follow-up in clinic.   Plan: patient to f/u in surgery clinic Monday. Caregiver asked to call office Monday  morning.   Caregiver urged to return with worsening pain, worsening swelling, expanding area of redness or streaking up extremity, fever, or any other concerns. Caregiver verbalizes understanding and agrees with plan.    MDM   1. Post-operative complication    Patient with firmness and discoloration from postop site (surgery on 03/02/13). Discoloration does not appear erythematous or feel warm. It appears more like ecchymosis. Do not suspect cellulitis. No definite abscess felt. No purulent or serous drainage currently coming from the site. Patient does not have any systemic symptoms of illness. I've spoken with surgery who recommends followup early next week. Caregiver agrees with this plan.    Carlisle Cater, PA-C 04/02/13 [phone]

## 2013-04-02 NOTE — ED Notes (Signed)
House manager reports that pt had breast surgery in December. Today when pt woke up noticed left breast with redness and hardness. Surgical area noted with redness and hardened area. Steri strips in place. Unable to obtain BP due to pt fighting.

## 2013-04-02 NOTE — Discharge Instructions (Signed)
Please read and follow all provided instructions.  Your diagnoses today include:  1. Post-operative complication     Tests performed today include:  Vital signs. See below for your results today.   Medications prescribed:   None  Take any prescribed medications only as directed.   Home care instructions:  Wash area gently twice a day with warm soapy water. Do not apply alcohol or hydrogen peroxide.   Follow-up instructions: See your surgeon for follow-up in 48 hours for a recheck. I spoke with Dr. Redmond Pulling on the phone today during your ED visit.   Please follow-up with your primary care provider in the next 1 week for further evaluation of your symptoms. If you do not have a primary care doctor -- see below for referral information.   Return instructions:  Return to the Emergency Department if you have:  Fever  Worsening symptoms  Worsening pain  Worsening swelling  Redness of the skin that moves away from the affected area, especially if it streaks away from the affected area   Any other emergent concerns  Your vital signs today were: BP 120/74   Pulse 84   Resp 18   SpO2 99% If your blood pressure (BP) was elevated above 135/85 this visit, please have this repeated by your doctor within one month. --------------

## 2013-04-03 NOTE — ED Provider Notes (Signed)
Medical screening examination/treatment/procedure(s) were performed by non-physician practitioner and as supervising physician I was immediately available for consultation/collaboration.  EKG Interpretation   None         Saddie Benders. Dorna Mai, MD 04/03/13 5883

## 2013-04-04 ENCOUNTER — Telehealth (INDEPENDENT_AMBULATORY_CARE_PROVIDER_SITE_OTHER): Payer: Self-pay

## 2013-04-04 NOTE — Telephone Encounter (Signed)
The nurse Suanne Marker called to report that the pt had to go to the ER this weekend to have her lumpectomy incision checked.  It was pink and red, hard and swollen.  The ER provider told her it is not infected but it is a complication from surgery.  Their note is in the computer and she called to schedule an appointment.  I spoke to [facility] and she let me put the appointment in for tomorrow on Dr Bertrum Sol urgent clinic.

## 2013-04-05 ENCOUNTER — Encounter (INDEPENDENT_AMBULATORY_CARE_PROVIDER_SITE_OTHER): Payer: Self-pay | Admitting: General Surgery

## 2013-04-05 ENCOUNTER — Ambulatory Visit (INDEPENDENT_AMBULATORY_CARE_PROVIDER_SITE_OTHER): Payer: Medicare Other | Admitting: General Surgery

## 2013-04-05 VITALS — BP 134/82 | HR 76 | Temp 97.6°F | Resp 18 | Ht <= 58 in | Wt 113.2 lb

## 2013-04-05 DIAGNOSIS — Z4889 Encounter for other specified surgical aftercare: Secondary | ICD-10-CM

## 2013-04-05 NOTE — Progress Notes (Signed)
Procedure:  Left partial mastectomy  Date:  03/04/2013  Pathology:  Invasive breast cancer, margins clear, ER/PR positive, proliferation rate 16%  History:  She comes in today because she's had increased swelling and some discoloration of the left breast. She was seen in the emergency department over the weekend. No evidence of infection was seen at that time.  Exam: General- Is in NAD. Left breast-incision is clean and intact.  There is significant swelling and some ecchymosis noted. No erythema or drainage.  Assessment:  Invasive left breast cancer Status post lumpectomy. She has findings consistent with a seroma.  Plan:  Will let the seruma resolve on its own. Return visit 2-3 months.

## 2013-04-05 NOTE — Patient Instructions (Signed)
Wear a support bra as much as possible.

## 2013-04-07 ENCOUNTER — Telehealth: Payer: Self-pay | Admitting: *Deleted

## 2013-04-07 ENCOUNTER — Encounter: Payer: Self-pay | Admitting: *Deleted

## 2013-04-07 NOTE — Telephone Encounter (Signed)
Called and left message for caregiver to call me back about the location of patients reports. (mammo, [place], mri, etc)

## 2013-04-07 NOTE — Telephone Encounter (Signed)
The pt's nurse Zigmund Daniel) called me back and stated that the pt is not cooperative and no mammo &/or [place] has been completed.  She also stated that the pt's guardian gives the consent for pt to have oral pill for therapy and Dr. Jana Hakim would just need to call so she could give him the consent [place].  I told her that I would let Dr. Jana Hakim know.  Placed a note for Dr. Jana Hakim about the consent and about no radiology reports in the chart.

## 2013-04-11 ENCOUNTER — Encounter: Payer: Self-pay | Admitting: *Deleted

## 2013-04-11 NOTE — Progress Notes (Signed)
Finished completing chart.  Added to the spreadsheet.  Placed chart in Dr. Virgie Dad box.

## 2013-04-13 ENCOUNTER — Telehealth: Payer: Self-pay | Admitting: *Deleted

## 2013-04-13 ENCOUNTER — Ambulatory Visit: Payer: Medicare Other

## 2013-04-13 ENCOUNTER — Ambulatory Visit (HOSPITAL_BASED_OUTPATIENT_CLINIC_OR_DEPARTMENT_OTHER): Payer: Medicare Other | Admitting: Oncology

## 2013-04-13 VITALS — Ht <= 58 in | Wt 118.6 lb

## 2013-04-13 DIAGNOSIS — M949 Disorder of cartilage, unspecified: Secondary | ICD-10-CM

## 2013-04-13 DIAGNOSIS — F73 Profound intellectual disabilities: Secondary | ICD-10-CM | POA: Insufficient documentation

## 2013-04-13 DIAGNOSIS — F79 Unspecified intellectual disabilities: Secondary | ICD-10-CM

## 2013-04-13 DIAGNOSIS — M899 Disorder of bone, unspecified: Secondary | ICD-10-CM

## 2013-04-13 DIAGNOSIS — Z170 Estrogen receptor positive status [ER+]: Secondary | ICD-10-CM

## 2013-04-13 DIAGNOSIS — F489 Nonpsychotic mental disorder, unspecified: Secondary | ICD-10-CM

## 2013-04-13 DIAGNOSIS — C50919 Malignant neoplasm of unspecified site of unspecified female breast: Secondary | ICD-10-CM

## 2013-04-13 NOTE — Telephone Encounter (Signed)
appts made and printed...td 

## 2013-04-13 NOTE — Progress Notes (Signed)
[facility]  [phone] [phone]     ID: Paula Cantu OB: 1946-01-18  MR#: [id number]  XYB#:[id number]  PCP: Paula Cantu GYN:   SU: Paula Cantu OTHER MD: Paula Cantu  CHIEF COMPLAINT: Patient is nonverbal  HISTORY OF PRESENT ILLNESS: According to nodes in the staff, a left breast mass was noted during a mass and this was brought to her primary physician's attention, who referred the patient to Dr. Lynwood Cantu. He saw the patient's on 01/07/2013 and noted a 5 cm mass in the superior aspect of the left breast, which seemed relatively fixed. There was no erythema. Very limited ultrasound was obtained and showed this to be solid. There was no evidence of supraclavicular adenopathy.  Needle biopsy was suggested but was not allowed by the patient. Mammography could not be obtained. Dr. Clarise Cantu spoke with the patient's guardian, Paula Cantu, who lives in [place], and discuss the situation with her. Approval was obtained for surgery and this was performed 03/04/2013. The final pathology ([id number]) showed an invasive ductal carcinoma, grade 3, measuring 4 cm, with negative margins (closest 2 mm) estrogen receptor positive at 91%, progesterone receptor positive at 12%, both with strong staining intensity, and a proliferation marker of 19%.  There is a question whether the patient may have received tamoxifen and/or Megace in the past, but I do not have any record regarding that.  The patient's subsequent history is as detailed below  INTERVAL HISTORY: Paula Cantu was seen in the breast clinic on 04/13/2013 accompanied by her caregiver Paula Cantu and her transportation/ caregiver, Paula Cantu.  REVIEW OF SYSTEMS: Unable to obtain from the patient. According to the staff, the patient apparently tolerated the surgery without major complications. However on 04/02/2013, the patient was evaluated in the emergency room because of a change in her  surgical site. According to the record his exam, the site was not erythematous warm, or exhibited any drainage. There were also no systemic symptoms. This was felt possibly to be due to ecchymosis.  PAST MEDICAL HISTORY: Past Medical History  Diagnosis Date  . Arthritis   . Cancer     endometrial  . Intellectual disability     severe intellectual disability, non-verbal  . Seizures     remote history; possible febrile childhood seizure  . Diabetes mellitus without complication     takes metformin  . Osteopenia     PAST SURGICAL HISTORY: Past Surgical History  Procedure Laterality Date  . Abdominal hysterectomy    . Breast lumpectomy Left 03/04/2013    Procedure: LEFT BREAST LUMPECTOMY;  Surgeon: Paula Hollingshead, MD;  Location: [facility];  Service: General;  Laterality: Left;    FAMILY HISTORY No family history on file.  GYNECOLOGIC HISTORY:  Status post hysterectomy  SOCIAL HISTORY:  The patient lives in a group home. Her caregiver, Paula Cantu, lives in [place]. She can be reached at [phone]. She has agreed to oral treatment for this patient.    ADVANCED DIRECTIVES:    HEALTH MAINTENANCE: History  Substance Use Topics  . Smoking status: Never Smoker   . Smokeless tobacco: Never Used  . Alcohol Use: No     Colonoscopy:  PAP:  Bone density:  Lipid panel:  Allergies  Allergen Reactions  . Bacillus Other (See Comments)    unknown  . Lactose Intolerance (Gi) Other (See Comments)    On nursing home records  . Ppd [Tuberculin Purified Protein Derivative] Other (See Comments)  On nursing home records    Current Outpatient Prescriptions  Medication Sig Dispense Refill  . calcium-vitamin D (OSCAL 500/200 D-3) 500-200 MG-UNIT per tablet Take 1 tablet by mouth 2 (two) times daily.      Marland Kitchen HYDROcodone-acetaminophen (NORCO/VICODIN) 5-325 MG per tablet Take 1-2 tablets by mouth every 4 (four) hours as needed.  30 tablet  0  . loratadine (CLARITIN) 10 MG tablet  Take 10 mg by mouth daily.      . metFORMIN (GLUCOPHAGE) 850 MG tablet Take 850 mg by mouth daily with breakfast.      . sennosides-docusate sodium (SENOKOT-S) 8.6-50 MG tablet Take 2 tablets by mouth daily.       No current facility-administered medications for this visit.    OBJECTIVE: Middle-aged white woman examined standing up Filed Vitals:     Body mass index is 24.79 kg/(m^2).    ECOG FS:0 - Asymptomatic  Physical exam was limited by patient's cooperation, but she did allow me to feel her neck and supraclavicular area. She lifted her shirt so that I could examine her breast. The left breast is status post lumpectomy. The incision is healing well. There is no dehiscence, erythema, or unusual swelling or tenderness. Mental status: The patient is nonverbal. She was very pleasant and  I felt was cooperating to the best of her understanding.   LAB RESULTS:  CMP  No results found for this basename: na,  k,  cl,  co2,  glucose,  bun,  creatinine,  calcium,  prot,  albumin,  ast,  alt,  alkphos,  bilitot,  gfrnonaa,  gfraa    I No results found for this basename: SPEP,  UPEP,   kappa and lambda light chains    No results found for this basename: WBC,  NEUTROABS,  HGB,  HCT,  MCV,  PLT      Chemistry   No results found for this basename: NA,  K,  CL,  CO2,  BUN,  CREATININE,  GLU   No results found for this basename: CALCIUM,  ALKPHOS,  AST,  ALT,  BILITOT       No results found for this basename: LABCA2    No components found with this basename: LABCA125    No results found for this basename: INR,  in the last 168 hours  Urinalysis No results found for this basename: colorurine,  appearanceur,  labspec,  phurine,  glucoseu,  hgbur,  bilirubinur,  ketonesur,  proteinur,  urobilinogen,  nitrite,  leukocytesur    STUDIES: No results available do to patient's mental status  ASSESSMENT: 68 y.o[facility] residents status post left lumpectomy with no sentinel lymph  node sampling 03/04/2013 for a pT2 pNX, stage II invasive ductal carcinoma, grade 3, estrogen receptor 91% positive, progesterone receptor 12% positive, with an MIB-1 of 19%.  (1) tamoxifen started 04/13/2013  PLAN: I spent approximately 45 minutes with the patient and her caregivers going over the situation. He is is at risk of both local and systemic recurrence. Given the overall situation, chemotherapy and HER-2 therapy would not be an option (which is the reason HER-2 was not obtained). However her caregiver has agreed to oral treatment and given the fact that the patient has osteopenia and is status post hysterectomy, I think tamoxifen would be a better choice for her than an aromatase inhibitor.  I did write down some of the possible toxicities, side effects and complications of tamoxifen. I suggested they call us if any problems develop.  We will see if again in 4 months, since it takes that long to have attain a steady blood level of tamoxifen. If she tolerates the tamoxifen well the plan will be to follow her every 6 months with physical exam alone.  I am hopeful was a simple intervention [place] will be spared any difficulties from her breast cancer diagnosis. I asked the staff to call us with any questions or concerns that may develop before the next visit.    Chauncey Cruel, MD   04/13/2013 4:51 PM

## 2013-06-17 ENCOUNTER — Telehealth: Payer: Self-pay | Admitting: *Deleted

## 2013-06-17 NOTE — Telephone Encounter (Signed)
Mailed after appt letter to pt. 

## 2013-07-29 ENCOUNTER — Encounter (INDEPENDENT_AMBULATORY_CARE_PROVIDER_SITE_OTHER): Payer: Self-pay | Admitting: General Surgery

## 2013-07-29 ENCOUNTER — Ambulatory Visit (INDEPENDENT_AMBULATORY_CARE_PROVIDER_SITE_OTHER): Payer: Medicare Other | Admitting: General Surgery

## 2013-07-29 VITALS — Ht <= 58 in | Wt 114.0 lb

## 2013-07-29 DIAGNOSIS — C50919 Malignant neoplasm of unspecified site of unspecified female breast: Secondary | ICD-10-CM

## 2013-07-29 NOTE — Progress Notes (Signed)
Procedure:  Left partial mastectomy  Date:  03/04/2013  Pathology:  Invasive breast cancer, margins clear, ER/PR positive, proliferation rate 16%  History:  She is here today for followup of her left breast cancer. She is taking tamoxifen. Her assistance are here with her.  Exam: General- Is in NAD. Left breast-Firm upper outer quadrant scars present. No new masses.  Right breast demonstrates no new masses. Assessment:  Invasive left breast cancer Status post lumpectomy-No clinical evidence of recurrence  Plan: Return visit 6 months.

## 2013-07-29 NOTE — Patient Instructions (Signed)
Call if you find any new lumps in your breasts or chest wall. 

## 2013-08-11 ENCOUNTER — Ambulatory Visit (HOSPITAL_BASED_OUTPATIENT_CLINIC_OR_DEPARTMENT_OTHER): Payer: Medicare Other | Admitting: Oncology

## 2013-08-11 VITALS — Ht <= 58 in | Wt 114.8 lb

## 2013-08-11 DIAGNOSIS — F79 Unspecified intellectual disabilities: Secondary | ICD-10-CM

## 2013-08-11 DIAGNOSIS — C50919 Malignant neoplasm of unspecified site of unspecified female breast: Secondary | ICD-10-CM

## 2013-08-11 NOTE — Progress Notes (Signed)
[place]  [phone] Fax:([phone]     ID: Paula Cantu OB: [birth year]  MR#: [id number]  HWE#:[id number]  PCP: Paula Cantu [phone]) GYN:   SU: Paula Cantu OTHER MD: Paula Cantu  CHIEF COMPLAINT:   BREAST CANCER HISTORY: From the original intake note:  Patient is nonverbal. According to nodes in the staff, a left breast mass was noted during a mass and this was brought to her primary physician's attention, who referred the patient to Dr. Lynwood Cantu. He saw the patient's on 01/07/2013 and noted a 5 cm mass in the superior aspect of the left breast, which seemed relatively fixed. There was no erythema. Very limited ultrasound was obtained and showed this to be solid. There was no evidence of supraclavicular adenopathy.  Needle biopsy was suggested but was not allowed by the patient. Mammography could not be obtained. Dr. Clarise Cantu spoke with the patient's guardian, Paula Cantu, who lives in [place], and discuss the situation with her. Approval was obtained for surgery and this was performed 03/04/2013. The final pathology ([id number]) showed an invasive ductal carcinoma, grade 3, measuring 4 cm, with negative margins (closest 2 mm) estrogen receptor positive at 91%, progesterone receptor positive at 12%, both with strong staining intensity, and a proliferation marker of 19%.  There is a question whether the patient may have received tamoxifen and/or Megace in the past, but I do not have any record regarding that.  The patient's subsequent history is as detailed below  INTERVAL HISTORY: Paula Cantu returns today for followup of 4 breast cancer accompanied by a caregiver and her transportation/ caregiver, Paula Cantu.  REVIEW OF SYSTEMS: [name] appears comfortable with me today. According to the staff present she is tolerating the tamoxifen without hot flashes, vaginal stickiness, or other apparent side effects or complications. A  detailed review of systems today was negative as far as the staff is aware.  PAST MEDICAL HISTORY: Past Medical History  Diagnosis Date  . Arthritis   . Cancer     endometrial  . Intellectual disability     severe intellectual disability, non-verbal  . Seizures     remote history; possible febrile childhood seizure  . Diabetes mellitus without complication     takes metformin  . Osteopenia     PAST SURGICAL HISTORY: Past Surgical History  Procedure Laterality Date  . Abdominal hysterectomy    . Breast lumpectomy Left 03/04/2013    Procedure: LEFT BREAST LUMPECTOMY;  Surgeon: Paula Hollingshead, MD;  Location: [facility];  Service: General;  Laterality: Left;    FAMILY HISTORY No family history on file.  GYNECOLOGIC HISTORY:  Status post hysterectomy  SOCIAL HISTORY:  The patient lives in a group home. Her caregiver, Paula Cantu, lives in [place]. She can be reached at [phone]. She has agreed to oral  antiestrogen treatment for this patient.    ADVANCED DIRECTIVES:    HEALTH MAINTENANCE: History  Substance Use Topics  . Smoking status: Never Smoker   . Smokeless tobacco: Never Used  . Alcohol Use: No     Colonoscopy:  RWE:RXVQMG post hysterectomy  Bone density:  Lipid panel:  Allergies  Allergen Reactions  . Bacillus Other (See Comments)    unknown  . Lactose Intolerance (Gi) Other (See Comments)    On nursing home records  . Ppd [Tuberculin Purified Protein Derivative] Other (See Comments)    On nursing home records    Current Outpatient Prescriptions  Medication Sig Dispense Refill  .  calcium-vitamin D (OSCAL 500/200 D-3) 500-200 MG-UNIT per tablet Take 1 tablet by mouth 2 (two) times daily.      . cholecalciferol (VITAMIN D) 1000 UNITS tablet Take 2,000 Units by mouth daily.      Marland Kitchen loratadine (CLARITIN) 10 MG tablet Take 10 mg by mouth daily.      . metFORMIN (GLUCOPHAGE) 850 MG tablet Take 850 mg by mouth daily with breakfast.      .  sennosides-docusate sodium (SENOKOT-S) 8.6-50 MG tablet Take 2 tablets by mouth daily.      . tamoxifen (NOLVADEX) 20 MG tablet Take 20 mg by mouth daily.       No current facility-administered medications for this visit.    OBJECTIVE: Middle-aged white woman examined standing up Filed Vitals:     Body mass index is 25.29 kg/(m^2).    ECOG FS:0 - Asymptomatic  Patient would not allow vitals signs checked, but did allow me to examine her. I do not palpate any cervical supraclavicular or axillary adenopathy. Lungs were clear to auscultation and percussion. Heart showed a regular rate and rhythm. The right breast was unremarkable. The left breast status post lumpectomy. There is no evidence of local recurrence.  LAB RESULTS:  CMP  No results found for this basename: na,  k,  cl,  co2,  glucose,  bun,  creatinine,  calcium,  prot,  albumin,  ast,  alt,  alkphos,  bilitot,  gfrnonaa,  gfraa    I No results found for this basename: SPEP,  UPEP,   kappa and lambda light chains    No results found for this basename: WBC,  NEUTROABS,  HGB,  HCT,  MCV,  PLT      Chemistry   No results found for this basename: NA,  K,  CL,  CO2,  BUN,  CREATININE,  GLU   No results found for this basename: CALCIUM,  ALKPHOS,  AST,  ALT,  BILITOT       No results found for this basename: LABCA2    No components found with this basename: LABCA125    No results found for this basename: INR,  in the last 168 hours  Urinalysis No results found for this basename: colorurine,  appearanceur,  labspec,  phurine,  glucoseu,  hgbur,  bilirubinur,  ketonesur,  proteinur,  urobilinogen,  nitrite,  leukocytesur    STUDIES: No results found.  ASSESSMENT: 68 y.o. [facility] resident status post left lumpectomy with no sentinel lymph node sampling 03/04/2013 for a pT2 pNX, stage II invasive ductal carcinoma, grade 3, estrogen receptor 91% positive, progesterone receptor 12% positive, with an MIB-1 of  19%.  (1) tamoxifen started 04/13/2013  PLAN: To the best of our ability to tell, [name] is tolerating the tamoxifen without any obvious side effects or complications. The plan is going to be to continue this drug for 10 years.  The patient does not tolerate mammograms. We are going to follow her by physical exam and we will do that on a yearly basis.  I am making no changes in her other medications at this point.  The patient's caregiver has a good understanding of the overall plan. Of course I will be glad to see Rinoa at any point before her next scheduled visit if any issues related to her breast cancer were to develop.    Chauncey Cruel, MD   08/13/2013 8:08 AM

## 2013-08-15 ENCOUNTER — Telehealth: Payer: Self-pay | Admitting: Oncology

## 2013-08-15 NOTE — Telephone Encounter (Signed)
Mailed the pt her June 2016 appt calendar

## 2014-01-25 ENCOUNTER — Encounter (INDEPENDENT_AMBULATORY_CARE_PROVIDER_SITE_OTHER): Payer: Self-pay | Admitting: General Surgery

## 2014-01-25 NOTE — Progress Notes (Signed)
Patient ID: Paula Cantu, female   DOB: [birth year], 68 y.o.   MRN: [id number]  Paula Cantu 01/25/2014 10:06 AM Location: [facility] Patient #: [id number] DOB: Nov 26, 1945 Single / Language: Cleophus Molt / Race: White Female History of Present Illness [name] MD; 01/25/2014 10:44 AM) Patient words: l breast.  The patient is a 68 year old female    Note:Procedure: Left partial mastectomy  Date: 03/04/2013  Pathology: Invasive breast cancer, margins clear, ER/PR positive, proliferation rate 16%  History: She is here today for followup of her left breast cancer. She is taking tamoxifen. Her assistant is here with her.  Exam:  Right breast demonstrates no new masses.  Other Problems Erasmo Leventhal, RN, BSN; 01/25/2014 10:06 AM) Breast Cancer  Past Surgical History Erasmo Leventhal, RN, BSN; 01/25/2014 10:06 AM) Breast Mass; Local Excision Left.  Medication History (Erasmo Leventhal, RN, BSN; 01/25/2014 10:09 AM) Claritin (10MG  Tablet, Oral as needed) Active. MetFORMIN HCl (850MG  Tablet, Oral daily) Active. Oscal 500/200 D-3 (500-200MG -UNIT Tablet, Oral daily) Active. Cholecalciferol (daily) Active. Senokot S (8.6-50MG  Tablet, Oral daily) Active. Tamoxifen Citrate (20MG  Tablet, Oral daily) Active. Medications Reconciled  Social History Multimedia programmer, Therapist, sports, BSN; 01/25/2014 10:06 AM) No alcohol use No caffeine use No drug use Tobacco use Never smoker.     Review of Systems Occupational hygienist, BSN; 01/25/2014 10:06 AM) General Not Present- Appetite Loss, Chills, Fatigue, Fever, Night Sweats, Weight Gain and Weight Loss. Skin Not Present- Change in Wart/Mole, Dryness, Hives, Jaundice, New Lesions, Non-Healing Wounds, Rash and Ulcer. HEENT Not Present- Earache, Hearing Loss, Hoarseness, Nose Bleed, Oral Ulcers, Ringing in the Ears, Seasonal Allergies, Sinus Pain, Sore Throat, Visual Disturbances, Wears glasses/contact lenses and  Yellow Eyes. Respiratory Not Present- Bloody sputum, Chronic Cough, Difficulty Breathing, Snoring and Wheezing. Breast Not Present- Breast Mass, Breast Pain, Nipple Discharge and Skin Changes. Cardiovascular Not Present- Chest Pain, Difficulty Breathing Lying Down, Leg Cramps, Palpitations, Rapid Heart Rate, Shortness of Breath and Swelling of Extremities. Gastrointestinal Not Present- Abdominal Pain, Bloating, Bloody Stool, Change in Bowel Habits, Chronic diarrhea, Constipation, Difficulty Swallowing, Excessive gas, Gets full quickly at meals, Hemorrhoids, Indigestion, Nausea, Rectal Pain and Vomiting. Female Genitourinary Not Present- Frequency, Nocturia, Painful Urination, Pelvic Pain and Urgency. Musculoskeletal Not Present- Back Pain, Joint Pain, Joint Stiffness, Muscle Pain, Muscle Weakness and Swelling of Extremities. Neurological Not Present- Decreased Memory, Fainting, Headaches, Numbness, Seizures, Tingling, Tremor, Trouble walking and Weakness. Psychiatric Not Present- Anxiety, Bipolar, Change in Sleep Pattern, Depression, Fearful and Frequent crying. Endocrine Not Present- Cold Intolerance, Excessive Hunger, Hair Changes, Heat Intolerance, Hot flashes and New Diabetes. Hematology Not Present- Easy Bruising, Excessive bleeding, Gland problems, HIV and Persistent Infections.   Physical Exam [name] MD; 01/25/2014 10:44 AM)  The physical exam findings are as follows: Note:General- Is in NAD. Exam done in standing position as she will not lie down  Left breast-soft upper outer quadrant scars present. No new masses. No axillary adenopathy.  Right breast-no palpable masses.    Assessment & Plan [name] MD; 01/25/2014 10:44 AM)  HISTORY OF BREAST CANCER (V10.3  Z85.3) Impression: Assessment: Invasive left breast cancer Status post lumpectomy-No clinical evidence of recurrence  Plan: Return visit 6 months.

## 2014-02-24 ENCOUNTER — Inpatient Hospital Stay: Admission: RE | Admit: 2014-02-24 | Payer: Medicare Other | Source: Ambulatory Visit

## 2014-02-24 ENCOUNTER — Other Ambulatory Visit: Payer: Self-pay | Admitting: Family Medicine

## 2014-02-24 DIAGNOSIS — R062 Wheezing: Secondary | ICD-10-CM

## 2014-02-24 DIAGNOSIS — R05 Cough: Secondary | ICD-10-CM

## 2014-02-24 DIAGNOSIS — R059 Cough, unspecified: Secondary | ICD-10-CM

## 2014-08-07 ENCOUNTER — Encounter: Payer: Self-pay | Admitting: *Deleted

## 2014-08-17 ENCOUNTER — Ambulatory Visit: Payer: Medicare Other | Admitting: Oncology

## 2014-09-05 ENCOUNTER — Ambulatory Visit: Payer: Medicare Other | Admitting: Oncology

## 2014-09-05 NOTE — Progress Notes (Signed)
no show

## 2014-09-06 ENCOUNTER — Telehealth: Payer: Self-pay | Admitting: Oncology

## 2014-09-06 NOTE — Telephone Encounter (Signed)
s.w. pt and advised on Aug appt....ok and aware °

## 2014-10-13 ENCOUNTER — Telehealth: Payer: Self-pay | Admitting: Oncology

## 2014-10-13 NOTE — Telephone Encounter (Signed)
returned call at [phone]. i could not make out the per that left the message

## 2014-10-18 ENCOUNTER — Telehealth: Payer: Self-pay | Admitting: Oncology

## 2014-10-18 ENCOUNTER — Ambulatory Visit (HOSPITAL_BASED_OUTPATIENT_CLINIC_OR_DEPARTMENT_OTHER): Payer: Medicare Other | Admitting: Oncology

## 2014-10-18 DIAGNOSIS — Z853 Personal history of malignant neoplasm of breast: Secondary | ICD-10-CM

## 2014-10-18 DIAGNOSIS — C50912 Malignant neoplasm of unspecified site of left female breast: Secondary | ICD-10-CM

## 2014-10-18 DIAGNOSIS — Z79810 Long term (current) use of selective estrogen receptor modulators (SERMs): Secondary | ICD-10-CM | POA: Diagnosis not present

## 2014-10-18 NOTE — Telephone Encounter (Signed)
Appointments made and avs printed for patient °

## 2014-10-18 NOTE — Progress Notes (Signed)
[place]  Telephone:(336) [phone] Fax:(336) [phone]     ID: Paula Cantu OB: [birth year]  MR#: [id number]  MPN#:[id number]  PCP: Trinidad Curet [phone]) GYN:   SU: Paula Cantu OTHER MD: Cephas Darby  CHIEF COMPLAINT: Estrogen receptor positive breast cancer  CURRENT TREATMENT: Tamoxifen   BREAST CANCER HISTORY: From the original intake note:  Patient is nonverbal. According to nodes in the staff, a left breast mass was noted during a mass and this was brought to her primary physician's attention, who referred the patient to Dr. Lynwood Dawley. He saw the patient's on 01/07/2013 and noted a 5 cm mass in the superior aspect of the left breast, which seemed relatively fixed. There was no erythema. Very limited ultrasound was obtained and showed this to be solid. There was no evidence of supraclavicular adenopathy.  Needle biopsy was suggested but was not allowed by the patient. Mammography could not be obtained. Dr. [name] spoke with the patient's guardian, [name], who lives in [place], and discuss the situation with her. Approval was obtained for surgery and this was performed 03/04/2013. The final pathology ([id number] [id number]) showed an invasive ductal carcinoma, grade 3, measuring 4 cm, with negative margins (closest 2 mm) estrogen receptor positive at 91%, progesterone receptor positive at 12%, both with strong staining intensity, and a proliferation marker of 19%.  There is a question whether the patient may have received tamoxifen and/or Megace in the past, but I do not have any record regarding that.  The patient's subsequent history is as detailed below  INTERVAL HISTORY: Paula Cantu returns today for follow-up of her left-sided breast cancer, accompanied by a caregiver and a transportation caregiver. They were unable to give me any details on the patient's current status and the patient herself is nonverbal.  REVIEW OF SYSTEMS: Unable to  obtain. The patient had urinated in her close and we had to provide them with some paper closing so she could return to the group home appropriately.  PAST MEDICAL HISTORY: Past Medical History  Diagnosis Date  . Arthritis   . Cancer     endometrial  . Intellectual disability     severe intellectual disability, non-verbal  . Seizures     remote history; possible febrile childhood seizure  . Diabetes mellitus without complication     takes metformin  . Osteopenia     PAST SURGICAL HISTORY: Past Surgical History  Procedure Laterality Date  . Abdominal hysterectomy    . Breast lumpectomy Left 03/04/2013    Procedure: LEFT BREAST LUMPECTOMY;  Surgeon: [name], MD;  Location: [facility];  Service: General;  Laterality: Left;    FAMILY HISTORY No family history on file.  GYNECOLOGIC HISTORY:  Status post hysterectomy  SOCIAL HISTORY:  The patient lives in a group home. Her caregiver, [name], lives in [place]. She can be reached at [phone]. She has agreed to oral  antiestrogen treatment for this patient.  Thank you(  ADVANCED DIRECTIVES:    HEALTH MAINTENANCE: History  Substance Use Topics  . Smoking status: Never Smoker   . Smokeless tobacco: Never Used  . Alcohol Use: No       Allergies  Allergen Reactions  . Bacillus Other (See Comments)    unknown  . Lactose Intolerance (Gi) Other (See Comments)    On nursing home records  . Ppd [Tuberculin Purified Protein Derivative] Other (See Comments)    On nursing home records    Current Outpatient Prescriptions  Medication Sig Dispense Refill  . calcium-vitamin D (OSCAL 500/200 D-3) 500-200 MG-UNIT per tablet Take 1 tablet by mouth 2 (two) times daily.    . cholecalciferol (VITAMIN D) 1000 UNITS tablet Take 2,000 Units by mouth daily.    Marland Kitchen loratadine (CLARITIN) 10 MG tablet Take 10 mg by mouth daily.    . metFORMIN (GLUCOPHAGE) 850 MG tablet Take 850 mg by mouth daily with breakfast.    .  sennosides-docusate sodium (SENOKOT-S) 8.6-50 MG tablet Take 2 tablets by mouth daily.    . tamoxifen (NOLVADEX) 20 MG tablet Take 20 mg by mouth daily.    . vitamin C (ASCORBIC ACID) 500 MG tablet Take 500 mg by mouth daily.     No current facility-administered medications for this visit.    OBJECTIVE: Middle-aged white woman examined standing up There were no vitals filed for this visit.   There is no weight on file to calculate BMI.     The patient was examined standing up. The left breast shows scarring from the prior surgery and this appears to be stable to the best of my ability to recall. I went ahead and photographed the breast for future reference. There is certainly no evidence of active disease at present in either breast.  Photo 10/18/2014    LAB RESULTS:  CMP  No results found for: NA  I No results found for: SPEP  No results found for: WBC    Chemistry   No results found for: NA No results found for: CALCIUM     No results found for: LABCA2  No components found for: [id number]  No results for input(s): INR in the last 168 hours.  Urinalysis No results found for: COLORURINE  STUDIES: No results found.  ASSESSMENT: 69 y.o. [facility] resident status post left lumpectomy with no sentinel lymph node sampling 03/04/2013 for a pT2 pNX, stage II invasive ductal carcinoma, grade 3, estrogen receptor 91% positive, progesterone receptor 12% positive, with an MIB-1 of 19%.  (1) tamoxifen started 04/13/2013  PLAN: Paula Cantu is now a year and a half out from her definitive surgery with no evidence of obvious disease recurrence. The plan is to continue tamoxifen indefinitely. I photographed her left breast today for future reference. She will see [place] again in one year.  We will be glad to see Paula Cantu at any point if and when the need arises before the next scheduled visit.    Chauncey Cruel, MD   10/18/2014 3:49 PM

## 2014-10-20 ENCOUNTER — Telehealth: Payer: Self-pay | Admitting: *Deleted

## 2014-10-20 NOTE — Telephone Encounter (Signed)
Called to follow up concerning pt's appt on 11/01/2014. I was able to be connected via VM to her caregiver at the group home where the pt lives, left a detailed message for caregiver to call this nurse back at [phone]. Message to be fwd to Gentry Fitz, NP.

## 2014-10-23 ENCOUNTER — Telehealth: Payer: Self-pay | Admitting: *Deleted

## 2014-10-23 NOTE — Telephone Encounter (Signed)
Called this am and spoke to pt's nurse, Lonia Farber, at [facility] about upcoming appt. Pt was seen here on Aug 3rd and will see [place] again in one year. Nurse told me that the appt on the 17th was a mistake on their end , the person who used to take care of their scheduling no longer works there and it had been a mix-up in the schedule but they know that the pt should come back in a year and not the 17th of Aug. Message to be fwd to Gentry Fitz, NP.

## 2014-11-01 ENCOUNTER — Ambulatory Visit: Payer: Medicare Other | Admitting: Nurse Practitioner

## 2014-11-01 ENCOUNTER — Telehealth: Payer: Self-pay | Admitting: Nurse Practitioner

## 2014-11-01 NOTE — Telephone Encounter (Signed)
Per heather b this patient is not coming today for her appointment,please cancel,done

## 2015-07-24 ENCOUNTER — Other Ambulatory Visit: Payer: Self-pay | Admitting: Nurse Practitioner

## 2015-10-18 ENCOUNTER — Ambulatory Visit (HOSPITAL_BASED_OUTPATIENT_CLINIC_OR_DEPARTMENT_OTHER): Payer: Medicare Other | Admitting: Adult Health

## 2015-10-18 DIAGNOSIS — Z853 Personal history of malignant neoplasm of breast: Secondary | ICD-10-CM | POA: Diagnosis present

## 2015-10-18 DIAGNOSIS — C50912 Malignant neoplasm of unspecified site of left female breast: Secondary | ICD-10-CM

## 2015-10-19 NOTE — Progress Notes (Signed)
CLINIC:  Survivorship   REASON FOR VISIT:  Routine follow-up for history of breast cancer.   BRIEF ONCOLOGIC HISTORY:    Breast cancer, left breast (Paula Cantu)   03/04/2013 Surgery    (L) lumpectomy ([facility]): IDC, grade 3, spanning 4 cm, LVI (+), margins negative. No lymph node sampling.   ER+ (91%), PR+ (12%), Ki67  19%. HER2 neg (ratio 1.43).          pT2, pNX     03/24/2013 Initial Diagnosis    Breast cancer, left breast (Paula Cantu)     04/10/2013 -  Anti-estrogen oral therapy    Tamoxifen 20 mg daily.       INTERVAL HISTORY:  Paula Cantu presents to the Survivorship Clinic today with her group home caregiver, Paula Cantu, for routine follow-up for her history of breast cancer.  The patient herself is non-verbal.  Per Paula Cantu, Paula Cantu has not exhibited any concerning signs for pain (grimacing or other non-verbal cues of discomfort). Her appetite is good; her bowels are regular.  She takes the Tamoxifen every day.       REVIEW OF SYSTEMS:  Unable to complete fully given the patient's mental state.     PAST MEDICAL/SURGICAL HISTORY:  Past Medical History:  Diagnosis Date  . Arthritis   . Cancer    endometrial  . Diabetes mellitus without complication    takes metformin  . Intellectual disability    severe intellectual disability, non-verbal  . Osteopenia   . Seizures    remote history; possible febrile childhood seizure   Past Surgical History:  Procedure Laterality Date  . ABDOMINAL HYSTERECTOMY    . BREAST LUMPECTOMY Left 03/04/2013   Procedure: LEFT BREAST LUMPECTOMY;  Surgeon: Paula Hollingshead, MD;  Location: [facility];  Service: General;  Laterality: Left;     ALLERGIES:  Allergies  Allergen Reactions  . Bacillus Other (See Comments)    unknown  . Lactose Intolerance (Gi) Other (See Comments)    On nursing home records  . Ppd [Tuberculin Purified Protein Derivative] Other (See Comments)    On nursing home records     CURRENT MEDICATIONS:  Outpatient Encounter  Prescriptions as of 10/18/2015  Medication Sig  . calcium-vitamin D (OSCAL 500/200 D-3) 500-200 MG-UNIT per tablet Take 1 tablet by mouth 2 (two) times daily.  . cholecalciferol (VITAMIN D) 1000 UNITS tablet Take 2,000 Units by mouth daily.  Marland Kitchen loratadine (CLARITIN) 10 MG tablet Take 10 mg by mouth daily.  . metFORMIN (GLUCOPHAGE) 850 MG tablet Take 850 mg by mouth daily with breakfast.  . sennosides-docusate sodium (SENOKOT-S) 8.6-50 MG tablet Take 2 tablets by mouth daily.  . tamoxifen (NOLVADEX) 20 MG tablet Take 20 mg by mouth daily.  . vitamin C (ASCORBIC ACID) 500 MG tablet Take 500 mg by mouth daily.   No facility-administered encounter medications on file as of 10/18/2015.      ONCOLOGIC FAMILY HISTORY:  No family history on file.    SOCIAL HISTORY:  Paula Cantu lives in a group home in [place], [place].  Her caregiver, Paula Cantu, is here with her today.  She really likes to color/draw. She also really likes coffee; this is helpful when trying to examine the patient during clinic visits.    PHYSICAL EXAMINATION:  Vital Signs: Unable to get vital signs or weight today given patient's inability to cooperate.  She allow me to briefly examine her today.   General: Well-appearing female in mild distress due to severe mental disability; patient is  non-verbal.  She is accompanied by her caregiver, Paula Cantu, today.  HEENT: Head is normocephalic.  Conjunctivae clear without exudate.  Sclerae anicteric.   Cardiovascular: Regular rate and rhythm.Marland Kitchen Respiratory: Clear to auscultation bilaterally. Chest expansion symmetric; breathing non-labored.  Breast Exam: (breast exam done with patient seated in chair and bra on; pt unable to get undressed due to inability for patient to cooperate) -Right breast: No appreciable masses on palpation.  -Left breast: No appreciable masses on palpation. Fibrosis at lumpectomy scar.  -Axilla: Unable to perform axillary exam due to inability of patient to  cooperate.  GI: Abdomen soft and round; non-tender. Bowel sounds normoactive.  GU: Deferred.  Neuro:   Extremities: No edema. Skin: Warm and dry.  LABORATORY DATA:  None for this visit.   DIAGNOSTIC IMAGING:  Most recent mammogram: Patient unable to tolerate mammograms.    ASSESSMENT AND PLAN:  Ms.. Cantu is a pleasant 70 y.o. female with history of Stage II left breast invasive ductal carcinoma, ER+/PR+/HER2-, diagnosed in 02/2013, treated with lumpectomy and anti-estrogen therapy with tamoxifen beginning in 03/2013.  She presents to the Survivorship Clinic for surveillance and routine follow-up.   1. History of breast cancer:  Paula Cantu is currently clinically without evidence of disease or recurrence of breast cancer. The patient is unable to tolerate mammography.  She will follow-up with her medical oncologist, Dr. Jana Cantu in 1 year (10/2016). She will continue her endocrine therapy with Tamoxifen indefinitely, as prescribed by Dr. Jana Cantu.     Dispo:  -Return to cancer center to see Dr. Jana Cantu in 10/2016.    A total of 20 minutes of face-to-face time was spent with this patient with greater than 50% of that time in counseling and care-coordination with the patient's caregiver given the patient's limited mental capacity/being non-verbal.    Mike Craze, NP [facility] [place] [phone]   Note: Paula Cantu [phone]

## 2016-04-07 DIAGNOSIS — R6889 Other general symptoms and signs: Secondary | ICD-10-CM | POA: Diagnosis not present

## 2016-04-08 DIAGNOSIS — R2689 Other abnormalities of gait and mobility: Secondary | ICD-10-CM | POA: Diagnosis not present

## 2016-04-08 DIAGNOSIS — G40919 Epilepsy, unspecified, intractable, without status epilepticus: Secondary | ICD-10-CM | POA: Diagnosis not present

## 2016-04-08 DIAGNOSIS — E789 Disorder of lipoprotein metabolism, unspecified: Secondary | ICD-10-CM | POA: Diagnosis not present

## 2016-04-08 DIAGNOSIS — R7309 Other abnormal glucose: Secondary | ICD-10-CM | POA: Diagnosis not present

## 2016-04-08 DIAGNOSIS — E119 Type 2 diabetes mellitus without complications: Secondary | ICD-10-CM | POA: Diagnosis not present

## 2016-04-08 DIAGNOSIS — M47899 Other spondylosis, site unspecified: Secondary | ICD-10-CM | POA: Diagnosis not present

## 2016-04-10 DIAGNOSIS — R2689 Other abnormalities of gait and mobility: Secondary | ICD-10-CM | POA: Diagnosis not present

## 2016-04-10 DIAGNOSIS — R5383 Other fatigue: Secondary | ICD-10-CM | POA: Diagnosis not present

## 2016-04-22 DIAGNOSIS — N390 Urinary tract infection, site not specified: Secondary | ICD-10-CM | POA: Diagnosis not present

## 2016-04-23 ENCOUNTER — Telehealth: Payer: Self-pay

## 2016-04-23 NOTE — Telephone Encounter (Signed)
LMOVM - returning call to Sonia Baller at [facility] re: concern for progression.  Sonia Baller to return call - provided with direct number.

## 2016-04-29 ENCOUNTER — Other Ambulatory Visit: Payer: Self-pay

## 2016-04-29 DIAGNOSIS — C50912 Malignant neoplasm of unspecified site of left female breast: Secondary | ICD-10-CM

## 2016-04-29 DIAGNOSIS — Z170 Estrogen receptor positive status [ER+]: Principal | ICD-10-CM

## 2016-05-06 ENCOUNTER — Encounter: Payer: Self-pay | Admitting: Oncology

## 2016-05-06 ENCOUNTER — Ambulatory Visit: Payer: Medicare Other | Admitting: Oncology

## 2016-05-14 ENCOUNTER — Other Ambulatory Visit: Payer: Self-pay | Admitting: Emergency Medicine

## 2016-05-14 DIAGNOSIS — C50912 Malignant neoplasm of unspecified site of left female breast: Secondary | ICD-10-CM

## 2016-05-20 ENCOUNTER — Ambulatory Visit (HOSPITAL_COMMUNITY): Admission: RE | Admit: 2016-05-20 | Payer: Medicare Other | Source: Ambulatory Visit

## 2016-05-20 ENCOUNTER — Other Ambulatory Visit: Payer: Medicare Other

## 2016-06-20 ENCOUNTER — Encounter (HOSPITAL_COMMUNITY): Payer: Self-pay

## 2016-06-20 ENCOUNTER — Emergency Department (HOSPITAL_COMMUNITY)
Admission: EM | Admit: 2016-06-20 | Discharge: 2016-06-20 | Disposition: A | Discharge status: Home / Self Care | Payer: Medicare Other | Attending: Emergency Medicine | Admitting: Emergency Medicine

## 2016-06-20 DIAGNOSIS — Z79899 Other long term (current) drug therapy: Secondary | ICD-10-CM | POA: Insufficient documentation

## 2016-06-20 DIAGNOSIS — Y929 Unspecified place or not applicable: Secondary | ICD-10-CM | POA: Insufficient documentation

## 2016-06-20 DIAGNOSIS — W01198A Fall on same level from slipping, tripping and stumbling with subsequent striking against other object, initial encounter: Secondary | ICD-10-CM | POA: Diagnosis not present

## 2016-06-20 DIAGNOSIS — S0083XA Contusion of other part of head, initial encounter: Secondary | ICD-10-CM | POA: Diagnosis not present

## 2016-06-20 DIAGNOSIS — Y999 Unspecified external cause status: Secondary | ICD-10-CM | POA: Insufficient documentation

## 2016-06-20 DIAGNOSIS — S0990XA Unspecified injury of head, initial encounter: Secondary | ICD-10-CM | POA: Diagnosis present

## 2016-06-20 DIAGNOSIS — Z8542 Personal history of malignant neoplasm of other parts of uterus: Secondary | ICD-10-CM | POA: Insufficient documentation

## 2016-06-20 DIAGNOSIS — Z853 Personal history of malignant neoplasm of breast: Secondary | ICD-10-CM | POA: Diagnosis not present

## 2016-06-20 DIAGNOSIS — E119 Type 2 diabetes mellitus without complications: Secondary | ICD-10-CM | POA: Diagnosis not present

## 2016-06-20 DIAGNOSIS — Y939 Activity, unspecified: Secondary | ICD-10-CM | POA: Insufficient documentation

## 2016-06-20 DIAGNOSIS — Z7984 Long term (current) use of oral hypoglycemic drugs: Secondary | ICD-10-CM | POA: Insufficient documentation

## 2016-06-20 MED ORDER — ACETAMINOPHEN 500 MG PO TABS
1000.0000 mg | ORAL_TABLET | Freq: Once | ORAL | Status: AC
  Administered 2016-06-20: 1000 mg via ORAL
  Filled 2016-06-20: qty 2

## 2016-06-20 NOTE — ED Notes (Signed)
Pt is extremely combative and not allowing staff to take vitals.

## 2016-06-20 NOTE — ED Triage Notes (Signed)
Patient resides in a group home. Patient was walking along and fell forward hitting her head on concrete. Patient has a small hematoma to the left forehead. Staff reports no LOC. Patient got up from fall and got onto a bus. Patient very combative. Patient is nonverbal.

## 2016-06-20 NOTE — ED Provider Notes (Signed)
[facility] DEPT Provider Note   CSN: [id number] Arrival date & time: 06/20/16  1127     History   Chief Complaint Chief Complaint  Patient presents with  . Fall  . Head Injury    HPI Paula Cantu is a 71 y.o. female.  The history is provided by the nursing home.  Fall  This is a new problem. The current episode started 1 to 2 hours ago. The problem occurs constantly. The problem has not changed since onset.Pertinent negatives include no chest pain, no abdominal pain and no headaches. Nothing aggravates the symptoms. Nothing relieves the symptoms. She has tried nothing for the symptoms.  Head Injury   The incident occurred 1 to 2 hours ago. She came to the ER via EMS. The injury mechanism was a fall. There was no loss of consciousness. There was no blood loss. The pain is mild. The pain has been constant since the injury.    Past Medical History:  Diagnosis Date  . Arthritis   . Cancer ([place])    endometrial  . Diabetes mellitus without complication ([place])    takes metformin  . Intellectual disability    severe intellectual disability, non-verbal  . Osteopenia   . Seizures ([place])    remote history; possible febrile childhood seizure    Patient Active Problem List   Diagnosis Date Noted  . Idiopathic mental retardation 04/13/2013  . Breast cancer, left breast ([place]) 03/24/2013    Past Surgical History:  Procedure Laterality Date  . ABDOMINAL HYSTERECTOMY    . BREAST LUMPECTOMY Left 03/04/2013   Procedure: LEFT BREAST LUMPECTOMY;  Surgeon: [name], MD;  Location: [facility];  Service: General;  Laterality: Left;    OB History    No data available       Home Medications    Prior to Admission medications   Medication Sig Start Date End Date Taking? Authorizing Provider  calcium-vitamin D (OSCAL 500/200 D-3) 500-200 MG-UNIT per tablet Take 1 tablet by mouth 2 (two) times daily.    Historical Provider, MD  cholecalciferol (VITAMIN D) 1000 UNITS tablet  Take 2,000 Units by mouth daily.    Historical Provider, MD  loratadine (CLARITIN) 10 MG tablet Take 10 mg by mouth daily.    Historical Provider, MD  metFORMIN (GLUCOPHAGE) 850 MG tablet Take 850 mg by mouth daily with breakfast.    Historical Provider, MD  sennosides-docusate sodium (SENOKOT-S) 8.6-50 MG tablet Take 2 tablets by mouth daily.    Historical Provider, MD  tamoxifen (NOLVADEX) 20 MG tablet Take 20 mg by mouth daily.    Historical Provider, MD  vitamin C (ASCORBIC ACID) 500 MG tablet Take 500 mg by mouth daily.    Historical Provider, MD    Family History No family history on file.  Social History Social History  Substance Use Topics  . Smoking status: Never Smoker  . Smokeless tobacco: Never Used  . Alcohol use No     Allergies   Bacillus; Lactose intolerance (gi); and Ppd [tuberculin purified protein derivative]   Review of Systems Review of Systems  Cardiovascular: Negative for chest pain.  Gastrointestinal: Negative for abdominal pain.  Neurological: Negative for headaches.  All other systems reviewed and are negative.    Physical Exam Updated Vital Signs Ht 5\' 1"  (1.549 m)   Wt 150 lb (68 kg)   BMI 28.34 kg/m   Physical Exam  Constitutional: She appears well-developed and well-nourished. No distress.  HENT:  Head: Normocephalic.  Nose:  Nose normal.  Mouth/Throat: Oropharynx is clear and moist.  2 cm left forehead contusion, no laceration, small abrasion at the lateral aspect  Eyes: Conjunctivae and EOM are normal. Pupils are equal, round, and reactive to light.  Neck: Neck supple. No tracheal deviation present.  Cardiovascular: Normal rate and regular rhythm.   Pulmonary/Chest: Effort normal. No respiratory distress.  Abdominal: Soft. She exhibits no distension.  Neurological: She is alert.  Skin: Skin is warm and dry.  Psychiatric: She has a normal mood and affect.     ED Treatments / Results  Labs (all labs ordered are listed, but only  abnormal results are displayed) Labs Reviewed - No data to display  EKG  EKG Interpretation None       Radiology No results found.  Procedures Procedures (including critical care time)  Medications Ordered in ED Medications  acetaminophen (TYLENOL) tablet 1,000 mg (not administered)     Initial Impression / Assessment and Plan / ED Course  I have reviewed the triage vital signs and the nursing notes.  Pertinent labs & imaging results that were available during my care of the patient were reviewed by me and considered in my medical decision making (see chart for details).     71 year old female with history of nonverbal severe intellectual disability presents with trip and fall forward while entering activity than sustaining a hematoma and contusion to her left forehead. No overlying laceration or abrasion. She had no loss of consciousness, has had no behavior changes, and has had no vomiting or other concerning signs since the incident. She was monitored in the emergency department for 2 hours without change in symptoms. Caregivers are available who state that patient is at her baseline and she would likely require sedation for imaging. I do not feel that emergent imaging is indicated currently as patient is not anticoagulated and has no high-risk features or signs of ICH. Plan to follow up with PCP as needed and return precautions discussed for worsening or new concerning symptoms.   Final Clinical Impressions(s) / ED Diagnoses   Final diagnoses:  Contusion of forehead, initial encounter    New Prescriptions New Prescriptions   No medications on file     Leo Grosser, MD 06/20/16 1308

## 2016-06-24 DIAGNOSIS — S0083XD Contusion of other part of head, subsequent encounter: Secondary | ICD-10-CM | POA: Diagnosis not present

## 2016-06-24 DIAGNOSIS — G40919 Epilepsy, unspecified, intractable, without status epilepticus: Secondary | ICD-10-CM | POA: Diagnosis not present

## 2016-06-24 DIAGNOSIS — E119 Type 2 diabetes mellitus without complications: Secondary | ICD-10-CM | POA: Diagnosis not present

## 2016-07-22 DIAGNOSIS — G40919 Epilepsy, unspecified, intractable, without status epilepticus: Secondary | ICD-10-CM | POA: Diagnosis not present

## 2016-07-22 DIAGNOSIS — E119 Type 2 diabetes mellitus without complications: Secondary | ICD-10-CM | POA: Diagnosis not present

## 2016-08-05 ENCOUNTER — Telehealth: Payer: Self-pay | Admitting: *Deleted

## 2016-08-05 NOTE — Telephone Encounter (Signed)
Call received from Dr Koren Shiver at pt's residential facility [facility] in [place].  He states pt having increased falls concerning for possible metastatic spread of known history of breast cancer s/p lumpectomy and currently receiving tamoxifen.  Note pt has known intellectual and behavioral defencies which have interfered with her health care due to pt becoming combative.  Pt was seen in ER in April for fall but further work up could not be obtained at that time.  Dr Bethel Born is wanting to inquire how we can assist with work up per above.  This note will be reviewed with MD per concerns for recommendations.  Contact number for Dr Bethel Born is [phone]  Fax number is [phone].

## 2016-08-06 ENCOUNTER — Other Ambulatory Visit: Payer: Self-pay | Admitting: *Deleted

## 2016-08-06 DIAGNOSIS — C50912 Malignant neoplasm of unspecified site of left female breast: Secondary | ICD-10-CM

## 2016-08-06 DIAGNOSIS — R296 Repeated falls: Secondary | ICD-10-CM

## 2016-08-06 DIAGNOSIS — R404 Transient alteration of awareness: Secondary | ICD-10-CM

## 2016-08-06 DIAGNOSIS — R2689 Other abnormalities of gait and mobility: Secondary | ICD-10-CM

## 2016-08-25 DIAGNOSIS — G40919 Epilepsy, unspecified, intractable, without status epilepticus: Secondary | ICD-10-CM | POA: Diagnosis not present

## 2016-08-25 DIAGNOSIS — E119 Type 2 diabetes mellitus without complications: Secondary | ICD-10-CM | POA: Diagnosis not present

## 2016-08-25 DIAGNOSIS — R269 Unspecified abnormalities of gait and mobility: Secondary | ICD-10-CM | POA: Diagnosis not present

## 2016-08-26 DIAGNOSIS — C50312 Malignant neoplasm of lower-inner quadrant of left female breast: Secondary | ICD-10-CM | POA: Diagnosis not present

## 2016-08-26 DIAGNOSIS — E119 Type 2 diabetes mellitus without complications: Secondary | ICD-10-CM | POA: Diagnosis not present

## 2016-08-26 DIAGNOSIS — R269 Unspecified abnormalities of gait and mobility: Secondary | ICD-10-CM | POA: Diagnosis not present

## 2016-08-28 ENCOUNTER — Ambulatory Visit (HOSPITAL_COMMUNITY): Payer: Medicare Other

## 2016-09-09 DIAGNOSIS — E119 Type 2 diabetes mellitus without complications: Secondary | ICD-10-CM | POA: Diagnosis not present

## 2016-09-09 DIAGNOSIS — C50312 Malignant neoplasm of lower-inner quadrant of left female breast: Secondary | ICD-10-CM | POA: Diagnosis not present

## 2016-09-09 DIAGNOSIS — F79 Unspecified intellectual disabilities: Secondary | ICD-10-CM | POA: Diagnosis not present

## 2016-09-15 ENCOUNTER — Encounter (HOSPITAL_COMMUNITY): Payer: Self-pay | Admitting: *Deleted

## 2016-09-15 NOTE — Anesthesia Preprocedure Evaluation (Addendum)
Anesthesia Evaluation  Patient identified by MRN, date of birth, ID band Patient confused    Reviewed: Allergy & Precautions, NPO status , Patient's Chart, lab work & pertinent test results  History of Anesthesia Complications Negative for: history of anesthetic complications  Airway Mallampati: I  TM Distance: >3 FB Neck ROM: Full    Dental  (+) Edentulous Upper, Edentulous Lower, Dental Advisory Given   Pulmonary neg pulmonary ROS,    Pulmonary exam normal        Cardiovascular negative cardio ROS Normal cardiovascular exam     Neuro/Psych Seizures -, Well Controlled,  Intellectual impairment from birth negative neurological ROS  negative psych ROS   GI/Hepatic negative GI ROS, Neg liver ROS,   Endo/Other  diabetes  Renal/GU negative Renal ROS     Musculoskeletal negative musculoskeletal ROS (+)   Abdominal   Peds  Hematology negative hematology ROS (+)   Anesthesia Other Findings Day of surgery medications reviewed with the patient.  Reproductive/Obstetrics                            Anesthesia Physical Anesthesia Plan  ASA: III  Anesthesia Plan: General   Post-op Pain Management:    Induction: Intravenous  PONV Risk Score and Plan: Ondansetron and Dexamethasone  Airway Management Planned: Oral ETT  Additional Equipment:   Intra-op Plan:   Post-operative Plan: Extubation in OR  Informed Consent: I have reviewed the patients History and Physical, chart, labs and discussed the procedure including the risks, benefits and alternatives for the proposed anesthesia with the patient or authorized representative who has indicated his/her understanding and acceptance.   Dental advisory given  Plan Discussed with: CRNA and Anesthesiologist  Anesthesia Plan Comments:        Anesthesia Quick Evaluation

## 2016-09-15 NOTE — Progress Notes (Signed)
Anesthesia note: SAME DAY WORK-UP.  Patient is a 71 year old female for brain MRI under anesthesia on 09/16/16. She has a history of intellectual disability and lives in a group home. Other history includes never smoker, endometrial cancer s/p hysterectomy, left breast cancer s/p left lumpectomy 03/04/13. Staff at that group home report that they are sending two individuals with her on the day of the procedure in hopes to help keep her "calm." It sound like historically, she has not always been able to be cooperative with evaluations largely due to her intellectual disability (ie, was extremely combative and not allowing staff to take vitals during 06/20/16 ED visit.) Brain MRI is being ordered to evaluate for frequent falls. 08/26/16 H&P by Dr. Koren Shiver is scanned under the Media tab.  Anesthesia steam to evaluate on the day of surgery to determine definitive anesthesia plan.  [facility] Short Stay Center/Anesthesiology Phone [phone] 09/15/2016 12:34 PM

## 2016-09-15 NOTE — Progress Notes (Signed)
[name] is a resident of [facility] facility- group home.  I spoke to a facility nurse , Paula Cantu.  I updated history and [name] is sending H/P and labs.  Patient is a type II DM.  I instructed [name] to hold Metformin in am and to check CBG. I instructed staff to check CBG in am and if it is less than 70 to have patient drink 1/2 cup of clear juice and recheck CBG in 1 minutes and call pre- op desk. I asked that medication record be sent in am with any administered medications documented.  I also asked for POA papers. I was informed that patient has white coat syndrome and will at out with it, 2 care givers that patient knows will be with [patient. I called Paula Cantu patient's cousin and POA to let her know that we will be calling her prior to procedure for her permission. ( [name] lives in [place] and knows it may be [phone])

## 2016-09-16 ENCOUNTER — Ambulatory Visit (HOSPITAL_COMMUNITY): Payer: Medicare Other | Admitting: Vascular Surgery

## 2016-09-16 ENCOUNTER — Ambulatory Visit (HOSPITAL_COMMUNITY)
Admission: RE | Admit: 2016-09-16 | Discharge: 2016-09-16 | Disposition: A | Discharge status: Home / Self Care | Payer: Medicare Other | Source: Ambulatory Visit | Attending: Oncology | Admitting: Oncology

## 2016-09-16 ENCOUNTER — Ambulatory Visit (HOSPITAL_COMMUNITY)
Admission: AD | Admit: 2016-09-16 | Discharge: 2016-09-16 | Disposition: A | Discharge status: Home / Self Care | Payer: Medicare Other | Source: Ambulatory Visit | Attending: Oncology | Admitting: Oncology

## 2016-09-16 ENCOUNTER — Encounter (HOSPITAL_COMMUNITY)
Admission: AD | Disposition: A | Discharge status: Home / Self Care | Payer: Self-pay | Source: Ambulatory Visit | Attending: Oncology

## 2016-09-16 DIAGNOSIS — C50912 Malignant neoplasm of unspecified site of left female breast: Secondary | ICD-10-CM | POA: Diagnosis not present

## 2016-09-16 DIAGNOSIS — R296 Repeated falls: Secondary | ICD-10-CM

## 2016-09-16 DIAGNOSIS — R2689 Other abnormalities of gait and mobility: Secondary | ICD-10-CM

## 2016-09-16 DIAGNOSIS — Z8542 Personal history of malignant neoplasm of other parts of uterus: Secondary | ICD-10-CM | POA: Diagnosis not present

## 2016-09-16 DIAGNOSIS — H748X2 Other specified disorders of left middle ear and mastoid: Secondary | ICD-10-CM | POA: Diagnosis not present

## 2016-09-16 DIAGNOSIS — F79 Unspecified intellectual disabilities: Secondary | ICD-10-CM | POA: Diagnosis not present

## 2016-09-16 DIAGNOSIS — R404 Transient alteration of awareness: Secondary | ICD-10-CM

## 2016-09-16 DIAGNOSIS — R569 Unspecified convulsions: Secondary | ICD-10-CM | POA: Diagnosis not present

## 2016-09-16 DIAGNOSIS — Z853 Personal history of malignant neoplasm of breast: Secondary | ICD-10-CM | POA: Insufficient documentation

## 2016-09-16 DIAGNOSIS — R4182 Altered mental status, unspecified: Secondary | ICD-10-CM | POA: Diagnosis not present

## 2016-09-16 DIAGNOSIS — E119 Type 2 diabetes mellitus without complications: Secondary | ICD-10-CM | POA: Diagnosis not present

## 2016-09-16 HISTORY — DX: Full incontinence of feces: R15.9

## 2016-09-16 HISTORY — PX: RADIOLOGY WITH ANESTHESIA: SHX6223

## 2016-09-16 HISTORY — DX: Constipation, unspecified: K59.00

## 2016-09-16 HISTORY — DX: Unspecified urinary incontinence: R32

## 2016-09-16 HISTORY — DX: Unspecified abnormalities of gait and mobility: R26.9

## 2016-09-16 LAB — POCT I-STAT CREATININE: CREATININE: 0.5 mg/dL (ref 0.44–1.00)

## 2016-09-16 LAB — GLUCOSE, CAPILLARY: Glucose-Capillary: 103 mg/dL — ABNORMAL HIGH (ref 65–99)

## 2016-09-16 SURGERY — RADIOLOGY WITH ANESTHESIA
Anesthesia: General

## 2016-09-16 MED ORDER — GADOBENATE DIMEGLUMINE 529 MG/ML IV SOLN
15.0000 mL | Freq: Once | INTRAVENOUS | Status: AC
  Administered 2016-09-16: 15 mL via INTRAVENOUS

## 2016-09-16 NOTE — Transfer of Care (Signed)
Immediate Anesthesia Transfer of Care Note  Patient: Paula Cantu  Procedure(s) Performed: Procedure(s): RADIOLOGY WITH ANESTHESIA MRI OF THE BRAIN WITH AND WITHOUT (N/A)  Patient Location: PACU  Anesthesia Type:General  Level of Consciousness: awake, alert  and pateint uncooperative  Airway & Oxygen Therapy: Patient Spontanous Breathing  Post-op Assessment: Report given to RN, Post -op Vital signs reviewed and stable, Patient moving all extremities and Patient moving all extremities X 4  Post vital signs: Reviewed and stable  Last Vitals:  Vitals:   09/16/16 0924  BP: (!) 136/96  Pulse: 100  Resp: 15  Temp: 36.2 C    Last Pain: There were no vitals filed for this visit.       Complications: No apparent anesthesia complications

## 2016-09-16 NOTE — Progress Notes (Signed)
Patient arrives to short stay non- cooperative, will not allow staff to place patient in bed, or remove from wheelchair. Dr. Tobias Alexander and Lelon Perla aware. Per group home staff members at bedside patient had all medications last night and has been NPO past midnight. Izora Gala, CRNA aware that patient is DM. Telephone consent obtain from legal guardian in [place] and H&P on chart.

## 2016-09-16 NOTE — Anesthesia Procedure Notes (Signed)
Procedure Name: Intubation Date/Time: 09/16/2016 8:00 AM Performed by: Izora Gala Pre-anesthesia Checklist: Patient identified Patient Re-evaluated:Patient Re-evaluated prior to inductionOxygen Delivery Method: Circle system utilized Preoxygenation: Pre-oxygenation with 100% oxygen Intubation Type: IV induction Ventilation: Mask ventilation without difficulty Laryngoscope Size: Mac and 3 Grade View: Grade I Tube type: Oral Tube size: 7.0 mm Number of attempts: 1 Airway Equipment and Method: Stylet and LTA kit utilized Placement Confirmation: ETT inserted through vocal cords under direct vision,  positive ETCO2 and breath sounds checked- equal and bilateral Secured at: 21 cm Tube secured with: Tape Dental Injury: Teeth and Oropharynx as per pre-operative assessment

## 2016-09-17 ENCOUNTER — Encounter (HOSPITAL_COMMUNITY): Payer: Self-pay | Admitting: Radiology

## 2016-09-30 DIAGNOSIS — K5901 Slow transit constipation: Secondary | ICD-10-CM | POA: Diagnosis not present

## 2016-09-30 DIAGNOSIS — E663 Overweight: Secondary | ICD-10-CM | POA: Diagnosis not present

## 2016-09-30 DIAGNOSIS — F72 Severe intellectual disabilities: Secondary | ICD-10-CM | POA: Diagnosis not present

## 2016-09-30 DIAGNOSIS — E119 Type 2 diabetes mellitus without complications: Secondary | ICD-10-CM | POA: Diagnosis not present

## 2016-09-30 DIAGNOSIS — E559 Vitamin D deficiency, unspecified: Secondary | ICD-10-CM | POA: Diagnosis not present

## 2016-10-07 DIAGNOSIS — C50312 Malignant neoplasm of lower-inner quadrant of left female breast: Secondary | ICD-10-CM | POA: Diagnosis not present

## 2016-10-07 DIAGNOSIS — R269 Unspecified abnormalities of gait and mobility: Secondary | ICD-10-CM | POA: Diagnosis not present

## 2016-10-07 DIAGNOSIS — J309 Allergic rhinitis, unspecified: Secondary | ICD-10-CM | POA: Diagnosis not present

## 2016-10-07 DIAGNOSIS — K5901 Slow transit constipation: Secondary | ICD-10-CM | POA: Diagnosis not present

## 2016-10-07 DIAGNOSIS — F72 Severe intellectual disabilities: Secondary | ICD-10-CM | POA: Diagnosis not present

## 2016-10-22 ENCOUNTER — Ambulatory Visit: Payer: Medicare Other | Admitting: Oncology

## 2016-10-28 DIAGNOSIS — E119 Type 2 diabetes mellitus without complications: Secondary | ICD-10-CM | POA: Diagnosis not present

## 2016-10-28 DIAGNOSIS — G40919 Epilepsy, unspecified, intractable, without status epilepticus: Secondary | ICD-10-CM | POA: Diagnosis not present

## 2016-11-18 DIAGNOSIS — E119 Type 2 diabetes mellitus without complications: Secondary | ICD-10-CM | POA: Diagnosis not present

## 2016-11-18 DIAGNOSIS — E559 Vitamin D deficiency, unspecified: Secondary | ICD-10-CM | POA: Diagnosis not present

## 2016-11-18 DIAGNOSIS — F73 Profound intellectual disabilities: Secondary | ICD-10-CM | POA: Diagnosis not present

## 2016-11-18 DIAGNOSIS — K5900 Constipation, unspecified: Secondary | ICD-10-CM | POA: Diagnosis not present

## 2016-11-25 DIAGNOSIS — C50312 Malignant neoplasm of lower-inner quadrant of left female breast: Secondary | ICD-10-CM | POA: Diagnosis not present

## 2016-11-25 DIAGNOSIS — E119 Type 2 diabetes mellitus without complications: Secondary | ICD-10-CM | POA: Diagnosis not present

## 2016-12-31 DIAGNOSIS — Z23 Encounter for immunization: Secondary | ICD-10-CM | POA: Diagnosis not present

## 2017-01-13 DIAGNOSIS — E119 Type 2 diabetes mellitus without complications: Secondary | ICD-10-CM | POA: Diagnosis not present

## 2017-01-13 DIAGNOSIS — F72 Severe intellectual disabilities: Secondary | ICD-10-CM | POA: Diagnosis not present

## 2017-01-13 DIAGNOSIS — K5900 Constipation, unspecified: Secondary | ICD-10-CM | POA: Diagnosis not present

## 2017-01-13 DIAGNOSIS — Q02 Microcephaly: Secondary | ICD-10-CM | POA: Diagnosis not present

## 2017-01-13 DIAGNOSIS — C50912 Malignant neoplasm of unspecified site of left female breast: Secondary | ICD-10-CM | POA: Diagnosis not present

## 2017-01-13 DIAGNOSIS — M1990 Unspecified osteoarthritis, unspecified site: Secondary | ICD-10-CM | POA: Diagnosis not present

## 2017-01-13 DIAGNOSIS — E559 Vitamin D deficiency, unspecified: Secondary | ICD-10-CM | POA: Diagnosis not present

## 2017-01-13 DIAGNOSIS — Z8542 Personal history of malignant neoplasm of other parts of uterus: Secondary | ICD-10-CM | POA: Diagnosis not present

## 2017-01-27 DIAGNOSIS — G40919 Epilepsy, unspecified, intractable, without status epilepticus: Secondary | ICD-10-CM | POA: Diagnosis not present

## 2017-01-27 DIAGNOSIS — Z6828 Body mass index (BMI) 28.0-28.9, adult: Secondary | ICD-10-CM | POA: Diagnosis not present

## 2017-01-27 DIAGNOSIS — E119 Type 2 diabetes mellitus without complications: Secondary | ICD-10-CM | POA: Diagnosis not present

## 2017-02-03 DIAGNOSIS — Z853 Personal history of malignant neoplasm of breast: Secondary | ICD-10-CM | POA: Diagnosis not present

## 2017-02-03 DIAGNOSIS — E559 Vitamin D deficiency, unspecified: Secondary | ICD-10-CM | POA: Diagnosis not present

## 2017-02-03 DIAGNOSIS — F72 Severe intellectual disabilities: Secondary | ICD-10-CM | POA: Diagnosis not present

## 2017-02-03 DIAGNOSIS — E663 Overweight: Secondary | ICD-10-CM | POA: Diagnosis not present

## 2017-02-03 DIAGNOSIS — E119 Type 2 diabetes mellitus without complications: Secondary | ICD-10-CM | POA: Diagnosis not present

## 2017-02-03 DIAGNOSIS — K5900 Constipation, unspecified: Secondary | ICD-10-CM | POA: Diagnosis not present

## 2017-03-09 DIAGNOSIS — E119 Type 2 diabetes mellitus without complications: Secondary | ICD-10-CM | POA: Diagnosis not present

## 2017-03-09 DIAGNOSIS — F79 Unspecified intellectual disabilities: Secondary | ICD-10-CM | POA: Diagnosis not present

## 2017-03-09 DIAGNOSIS — Z6828 Body mass index (BMI) 28.0-28.9, adult: Secondary | ICD-10-CM | POA: Diagnosis not present

## 2017-03-09 DIAGNOSIS — C50312 Malignant neoplasm of lower-inner quadrant of left female breast: Secondary | ICD-10-CM | POA: Diagnosis not present

## 2017-03-24 DIAGNOSIS — E119 Type 2 diabetes mellitus without complications: Secondary | ICD-10-CM | POA: Diagnosis not present

## 2017-03-24 DIAGNOSIS — G40919 Epilepsy, unspecified, intractable, without status epilepticus: Secondary | ICD-10-CM | POA: Diagnosis not present

## 2017-03-24 DIAGNOSIS — F79 Unspecified intellectual disabilities: Secondary | ICD-10-CM | POA: Diagnosis not present

## 2017-03-24 DIAGNOSIS — Z6828 Body mass index (BMI) 28.0-28.9, adult: Secondary | ICD-10-CM | POA: Diagnosis not present

## 2017-03-31 DIAGNOSIS — G40909 Epilepsy, unspecified, not intractable, without status epilepticus: Secondary | ICD-10-CM | POA: Diagnosis not present

## 2017-03-31 DIAGNOSIS — F73 Profound intellectual disabilities: Secondary | ICD-10-CM | POA: Diagnosis not present

## 2017-03-31 DIAGNOSIS — K5900 Constipation, unspecified: Secondary | ICD-10-CM | POA: Diagnosis not present

## 2017-04-28 DIAGNOSIS — K5900 Constipation, unspecified: Secondary | ICD-10-CM | POA: Diagnosis not present

## 2017-04-28 DIAGNOSIS — E119 Type 2 diabetes mellitus without complications: Secondary | ICD-10-CM | POA: Diagnosis not present

## 2017-04-28 DIAGNOSIS — F79 Unspecified intellectual disabilities: Secondary | ICD-10-CM | POA: Diagnosis not present

## 2017-04-28 DIAGNOSIS — M1990 Unspecified osteoarthritis, unspecified site: Secondary | ICD-10-CM | POA: Diagnosis not present

## 2017-04-28 DIAGNOSIS — J309 Allergic rhinitis, unspecified: Secondary | ICD-10-CM | POA: Diagnosis not present

## 2017-05-05 DIAGNOSIS — F79 Unspecified intellectual disabilities: Secondary | ICD-10-CM | POA: Diagnosis not present

## 2017-05-05 DIAGNOSIS — C50312 Malignant neoplasm of lower-inner quadrant of left female breast: Secondary | ICD-10-CM | POA: Diagnosis not present

## 2017-05-05 DIAGNOSIS — E119 Type 2 diabetes mellitus without complications: Secondary | ICD-10-CM | POA: Diagnosis not present

## 2017-05-12 DIAGNOSIS — C50912 Malignant neoplasm of unspecified site of left female breast: Secondary | ICD-10-CM | POA: Diagnosis not present

## 2017-05-12 DIAGNOSIS — Q02 Microcephaly: Secondary | ICD-10-CM | POA: Diagnosis not present

## 2017-05-12 DIAGNOSIS — C541 Malignant neoplasm of endometrium: Secondary | ICD-10-CM | POA: Diagnosis not present

## 2017-05-12 DIAGNOSIS — E119 Type 2 diabetes mellitus without complications: Secondary | ICD-10-CM | POA: Diagnosis not present

## 2017-05-12 DIAGNOSIS — E663 Overweight: Secondary | ICD-10-CM | POA: Diagnosis not present

## 2017-05-12 DIAGNOSIS — H6123 Impacted cerumen, bilateral: Secondary | ICD-10-CM | POA: Diagnosis not present

## 2017-05-12 DIAGNOSIS — F72 Severe intellectual disabilities: Secondary | ICD-10-CM | POA: Diagnosis not present

## 2017-05-19 DIAGNOSIS — C50312 Malignant neoplasm of lower-inner quadrant of left female breast: Secondary | ICD-10-CM | POA: Diagnosis not present

## 2017-05-19 DIAGNOSIS — F79 Unspecified intellectual disabilities: Secondary | ICD-10-CM | POA: Diagnosis not present

## 2017-05-19 DIAGNOSIS — E119 Type 2 diabetes mellitus without complications: Secondary | ICD-10-CM | POA: Diagnosis not present

## 2017-06-23 DIAGNOSIS — E119 Type 2 diabetes mellitus without complications: Secondary | ICD-10-CM | POA: Diagnosis not present

## 2017-06-23 DIAGNOSIS — K5900 Constipation, unspecified: Secondary | ICD-10-CM | POA: Diagnosis not present

## 2017-06-23 DIAGNOSIS — F79 Unspecified intellectual disabilities: Secondary | ICD-10-CM | POA: Diagnosis not present

## 2017-06-23 DIAGNOSIS — G40909 Epilepsy, unspecified, not intractable, without status epilepticus: Secondary | ICD-10-CM | POA: Diagnosis not present

## 2017-06-30 DIAGNOSIS — Z6829 Body mass index (BMI) 29.0-29.9, adult: Secondary | ICD-10-CM | POA: Diagnosis not present

## 2017-06-30 DIAGNOSIS — G40919 Epilepsy, unspecified, intractable, without status epilepticus: Secondary | ICD-10-CM | POA: Diagnosis not present

## 2017-06-30 DIAGNOSIS — E119 Type 2 diabetes mellitus without complications: Secondary | ICD-10-CM | POA: Diagnosis not present

## 2017-06-30 DIAGNOSIS — R269 Unspecified abnormalities of gait and mobility: Secondary | ICD-10-CM | POA: Diagnosis not present

## 2017-07-21 DIAGNOSIS — M8580 Other specified disorders of bone density and structure, unspecified site: Secondary | ICD-10-CM | POA: Diagnosis not present

## 2017-07-21 DIAGNOSIS — F79 Unspecified intellectual disabilities: Secondary | ICD-10-CM | POA: Diagnosis not present

## 2017-07-21 DIAGNOSIS — E119 Type 2 diabetes mellitus without complications: Secondary | ICD-10-CM | POA: Diagnosis not present

## 2017-07-21 DIAGNOSIS — E559 Vitamin D deficiency, unspecified: Secondary | ICD-10-CM | POA: Diagnosis not present

## 2017-07-28 DIAGNOSIS — E119 Type 2 diabetes mellitus without complications: Secondary | ICD-10-CM | POA: Diagnosis not present

## 2017-07-28 DIAGNOSIS — F79 Unspecified intellectual disabilities: Secondary | ICD-10-CM | POA: Diagnosis not present

## 2017-07-28 DIAGNOSIS — G40919 Epilepsy, unspecified, intractable, without status epilepticus: Secondary | ICD-10-CM | POA: Diagnosis not present

## 2017-07-29 DIAGNOSIS — F79 Unspecified intellectual disabilities: Secondary | ICD-10-CM | POA: Diagnosis not present

## 2017-07-29 DIAGNOSIS — C50312 Malignant neoplasm of lower-inner quadrant of left female breast: Secondary | ICD-10-CM | POA: Diagnosis not present

## 2017-07-29 DIAGNOSIS — E119 Type 2 diabetes mellitus without complications: Secondary | ICD-10-CM | POA: Diagnosis not present

## 2017-08-12 ENCOUNTER — Encounter: Payer: Self-pay | Admitting: Neurology

## 2017-08-12 ENCOUNTER — Telehealth: Payer: Self-pay | Admitting: Neurology

## 2017-08-12 ENCOUNTER — Ambulatory Visit (INDEPENDENT_AMBULATORY_CARE_PROVIDER_SITE_OTHER): Payer: Medicare Other | Admitting: Neurology

## 2017-08-12 ENCOUNTER — Encounter

## 2017-08-12 DIAGNOSIS — G3281 Cerebellar ataxia in diseases classified elsewhere: Secondary | ICD-10-CM | POA: Diagnosis not present

## 2017-08-12 DIAGNOSIS — R296 Repeated falls: Secondary | ICD-10-CM | POA: Diagnosis not present

## 2017-08-12 MED ORDER — ALPRAZOLAM 1 MG PO TABS: 1.0000 mg | ORAL_TABLET | Freq: Every evening | ORAL | 0 refills | Status: DC | PRN

## 2017-08-12 NOTE — Telephone Encounter (Signed)
Medicare/medicaid order sent to [facility]. No auth they will reach out to the pt to schedule.  °

## 2017-08-12 NOTE — Telephone Encounter (Signed)
Error

## 2017-08-12 NOTE — Progress Notes (Signed)
PATIENT: Paula Cantu DOB: 1946/01/09  Chief Complaint  Patient presents with  . Frequent falls    She resides at [facility]. She is here with two aids from the facility.  She is here to be evaluated for frequent falls.    Marland Kitchen PCP    [facility], [place]     HISTORICAL  Paula Cantu is a 72 year old female, seen in refer by her primary care doctor [facility], Melanee Left for evaluation normality, initial evaluation was on Aug 12, 2017  She has been a resident of group home for a long time, there was no family member involving her care, she currently resident at [facility] health service, is accompanied by 2 aides Jetta Lout from facility at the visit, they have known her  for more than 3 years.  She has past medical history of diabetes, mentally retarded, mute, follows simple commands, such as using bathroom, washing her hands, she eats well, sleeps well, able to ambulate without assistance.  There was seizure documented in her past medical history, but staff has never witnessed any seizure activity, there is no antiepileptics at her current medication list.  Since March 2019, she had episode of sudden onset falling, without clear reasons, she was standing, and suddenly fell to the ground, then she is able to get up by herself again, there was no seizure-like activity, no loss of consciousness, no self injury, it happened intermittently, once every 1 or 2 weeks.  Reviewed the record, MRI of the brain with without contrast in July 2018 for evaluation of fall, showed mild generalized atrophy, supratentorium small vessel disease, no acute findings, extensive left mastoid effusion,  History of breast cancer  REVIEW OF SYSTEMS: Full 14 system review of systems performed and notable only for as above.  ALLERGIES: Allergies  Allergen Reactions  . Lactose Intolerance (Gi) Other (See Comments)    On nursing home records  . Bacillus Other (See Comments)    UNSPECIFIED REACTION   . Ppd [Tuberculin  Purified Protein Derivative] Other (See Comments)    UNSPECIFIED REACTION  On nursing home records    HOME MEDICATIONS: Current Outpatient Medications  Medication Sig Dispense Refill  . Cholecalciferol (VITAMIN D) 2000 units tablet Take 2,000 Units by mouth daily.    Marland Kitchen menthol-zinc oxide (GOLD BOND) powder Apply 1 application topically 2 (two) times daily. Apply under breasts    . metFORMIN (GLUCOPHAGE) 850 MG tablet Take 850 mg by mouth daily with breakfast.    . sennosides-docusate sodium (SENOKOT-S) 8.6-50 MG tablet Take 2 tablets by mouth daily.    . tamoxifen (NOLVADEX) 20 MG tablet Take 20 mg by mouth daily.    . vitamin C (ASCORBIC ACID) 500 MG tablet Take 500 mg by mouth 2 (two) times daily.     No current facility-administered medications for this visit.     PAST MEDICAL HISTORY: Past Medical History:  Diagnosis Date  . Arthritis   . Cancer ([facility])    endometrial  . Constipation   . Diabetes mellitus without complication ([place])    takes metformin  . Frequent falls   . Gait disorder   . Incontinence of bowel   . Incontinence of urine   . Intellectual disability    severe intellectual disability, non-verbal  . Osteopenia   . Seizures ([place])    remote history; possible febrile childhood seizure    PAST SURGICAL HISTORY: Past Surgical History:  Procedure Laterality Date  . ABDOMINAL HYSTERECTOMY    . BREAST LUMPECTOMY  Left 03/04/2013   Procedure: LEFT BREAST LUMPECTOMY;  Surgeon: [name], MD;  Location: [facility];  Service: General;  Laterality: Left;  . RADIOLOGY WITH ANESTHESIA N/A 09/16/2016   Procedure: RADIOLOGY WITH ANESTHESIA MRI OF THE BRAIN WITH AND WITHOUT;  Surgeon: Radiologist, Medication, MD;  Location: [facility];  Service: Radiology;  Laterality: N/A;    FAMILY HISTORY: History reviewed. No pertinent family history.  SOCIAL HISTORY:  Social History   Socioeconomic History  . Marital status: Single    Spouse name: Not on file  . Number of  children: 0  . Years of education: Not on file  . Highest education level: Not on file  Occupational History  . Occupation: Disabled  Social Needs  . Financial resource strain: Not on file  . Food insecurity:    Worry: Not on file    Inability: Not on file  . Transportation needs:    Medical: Not on file    Non-medical: Not on file  Tobacco Use  . Smoking status: Never Smoker  . Smokeless tobacco: Never Used  Substance and Sexual Activity  . Alcohol use: No  . Drug use: No  . Sexual activity: Not on file  Lifestyle  . Physical activity:    Days per week: Not on file    Minutes per session: Not on file  . Stress: Not on file  Relationships  . Social connections:    Talks on phone: Not on file    Gets together: Not on file    Attends religious service: Not on file    Active member of club or organization: Not on file    Attends meetings of clubs or organizations: Not on file    Relationship status: Not on file  . Intimate partner violence:    Fear of current or ex partner: Not on file    Emotionally abused: Not on file    Physically abused: Not on file    Forced sexual activity: Not on file  Other Topics Concern  . Not on file  Social History Narrative   Lives at [facility].   She is ambidextrous.   Occasional caffeine use.     PHYSICAL EXAM   Vitals:   08/12/17 0943  BP: 138/70    Not recorded      There is no height or weight on file to calculate BMI.  PHYSICAL EXAMNIATION:  Gen: NAD, conversant, well nourised, obese, well groomed                     Cardiovascular: Regular rate rhythm, no peripheral edema, warm, nontender. Eyes: Conjunctivae clear without exudates or hemorrhage Neck: Supple, no carotid bruits. Pulmonary: Clear to auscultation bilaterally   NEUROLOGICAL EXAM:  MENTAL STATUS: Speech/Cognition: Awake, restless, wants to leave exam room, follow up simple one step command. She does not talk   CRANIAL NERVES: CN II: Visual fields are full  to confrontation.  Pupils are round equal and briskly reactive to light. CN III, IV, VI: extraocular movement are normal. No ptosis. CN V: Facial sensation is intact to pinprick in all 3 divisions bilaterally. Corneal responses are intact.  CN VII: Face is symmetric with normal eye closure and smile. CN VIII: Hearing is normal to rubbing fingers CN IX, X: Palate elevates symmetrically. Phonation is normal. CN XI: Head turning and shoulder shrug are intact CN XII: Tongue is midline with normal movements and no atrophy.  MOTOR: She move all 4 limbs without difficulty  REFLEXES: Reflexes are 2+ and symmetric at the biceps, triceps, knees, and ankles. Plantar responses are flexor.  SENSORY: Withdraw to pain  COORDINATION: No trunk or limb dysmetria noted.   GAIT/STANCE: Widebased unsteady gait   DIAGNOSTIC DATA (LABS, IMAGING, TESTING) - I reviewed patient records, labs, notes, testing and imaging myself where available.   ASSESSMENT AND PLAN  Paula Cantu is a 72 y.o. female   Mental retardation, falling episode History of breast cancer,  Status post left lobectomy in December 2014, invasion of anterior margin, lymphovascular invasion is identified, ductal carcinoma  Potential etiologies include seizure, need to rule out central nervous system etiology  MRI of cervical, MRI of the brain with without contrast,  She could not hold still for EEG    Marcial Pacas, M.D. Ph.D.  [facility] [address], [place], [place] [address] Ph: [phone] Fax: [phone]  CC: Trinidad Curet

## 2017-08-12 NOTE — Telephone Encounter (Signed)
Correction I sent the MRi's to [facility] and they will reach out to schedule.

## 2017-08-18 DIAGNOSIS — G3281 Cerebellar ataxia in diseases classified elsewhere: Secondary | ICD-10-CM | POA: Diagnosis not present

## 2017-08-18 DIAGNOSIS — Z6829 Body mass index (BMI) 29.0-29.9, adult: Secondary | ICD-10-CM | POA: Diagnosis not present

## 2017-08-18 DIAGNOSIS — G40919 Epilepsy, unspecified, intractable, without status epilepticus: Secondary | ICD-10-CM | POA: Diagnosis not present

## 2017-08-18 DIAGNOSIS — R269 Unspecified abnormalities of gait and mobility: Secondary | ICD-10-CM | POA: Diagnosis not present

## 2017-08-18 DIAGNOSIS — R296 Repeated falls: Secondary | ICD-10-CM | POA: Diagnosis not present

## 2017-08-24 ENCOUNTER — Other Ambulatory Visit: Payer: Self-pay | Admitting: *Deleted

## 2017-08-24 DIAGNOSIS — G3281 Cerebellar ataxia in diseases classified elsewhere: Secondary | ICD-10-CM

## 2017-08-24 NOTE — Telephone Encounter (Signed)
Called [facility] at [phone] (facility where patient resides) - spoke to Ileene Hutchinson, RN who is in charge of patient's care.  States patient has continued to have falling episodes, at least two events per month.   Per vo by Dr .Krista Blue, she would like MRI cervical without and MRI brain with/without completed under sedation at [facility].  New orders have been placed in Epic and signed by MD.

## 2017-08-24 NOTE — Telephone Encounter (Signed)
Paula Cantu with the hospital called me and informed me that patient needs to be sedated. But since she was scheduled at [facility] I will need new orders put in with a new accession number.

## 2017-08-24 NOTE — Telephone Encounter (Signed)
Michelle:  Please call her facility if she still has recurrent, intermittent falling episodes, if things are not improving or getting worse, might worth for her to go through sedation for MRI, if her symptoms are rare and improving, we will watch and see, or change to CT, check with her staff to see if she can hold still for 5-10 minutes at CT.

## 2017-08-25 ENCOUNTER — Other Ambulatory Visit: Payer: Medicare Other

## 2017-08-25 NOTE — Telephone Encounter (Signed)
Left a message for Awilda Metro RN informing her when the patient is having her MRI and that short stay will be giving them a call about exact time for the MRI.

## 2017-08-25 NOTE — Telephone Encounter (Signed)
I called [facility] and asked to speak to Paula Cantu they informed me she was not in and that they did not know when she was going to be in and hung up on me..  Patient is scheduled for sedated at [facility] for 09/15/17. I will try calling Paula Cantu again later.

## 2017-08-30 ENCOUNTER — Encounter (HOSPITAL_COMMUNITY): Payer: Self-pay | Admitting: Nurse Practitioner

## 2017-08-30 ENCOUNTER — Emergency Department (HOSPITAL_COMMUNITY): Payer: Medicare Other

## 2017-08-30 ENCOUNTER — Emergency Department (HOSPITAL_COMMUNITY)
Admission: EM | Admit: 2017-08-30 | Discharge: 2017-08-31 | Disposition: A | Discharge status: Home / Self Care | Payer: Medicare Other | Attending: Emergency Medicine | Admitting: Emergency Medicine

## 2017-08-30 DIAGNOSIS — Z7984 Long term (current) use of oral hypoglycemic drugs: Secondary | ICD-10-CM | POA: Diagnosis not present

## 2017-08-30 DIAGNOSIS — E119 Type 2 diabetes mellitus without complications: Secondary | ICD-10-CM | POA: Diagnosis not present

## 2017-08-30 DIAGNOSIS — Z79899 Other long term (current) drug therapy: Secondary | ICD-10-CM | POA: Diagnosis not present

## 2017-08-30 DIAGNOSIS — Z8542 Personal history of malignant neoplasm of other parts of uterus: Secondary | ICD-10-CM | POA: Insufficient documentation

## 2017-08-30 DIAGNOSIS — R4689 Other symptoms and signs involving appearance and behavior: Secondary | ICD-10-CM | POA: Diagnosis not present

## 2017-08-30 DIAGNOSIS — F72 Severe intellectual disabilities: Secondary | ICD-10-CM | POA: Insufficient documentation

## 2017-08-30 DIAGNOSIS — F918 Other conduct disorders: Secondary | ICD-10-CM | POA: Diagnosis not present

## 2017-08-30 DIAGNOSIS — Z853 Personal history of malignant neoplasm of breast: Secondary | ICD-10-CM | POA: Insufficient documentation

## 2017-08-30 DIAGNOSIS — R7989 Other specified abnormal findings of blood chemistry: Secondary | ICD-10-CM | POA: Diagnosis not present

## 2017-08-30 DIAGNOSIS — R4182 Altered mental status, unspecified: Secondary | ICD-10-CM | POA: Diagnosis not present

## 2017-08-30 LAB — COMPREHENSIVE METABOLIC PANEL
ALBUMIN: 3.6 g/dL (ref 3.5–5.0)
ALK PHOS: 48 U/L (ref 38–126)
ALT: 23 U/L (ref 14–54)
ANION GAP: 7 (ref 5–15)
AST: 24 U/L (ref 15–41)
BUN: 11 mg/dL (ref 6–20)
CALCIUM: 8.7 mg/dL — AB (ref 8.9–10.3)
CO2: 26 mmol/L (ref 22–32)
Chloride: 108 mmol/L (ref 101–111)
Creatinine, Ser: 0.68 mg/dL (ref 0.44–1.00)
GFR calc Af Amer: >60 mL/min (ref >60)
GFR calc non Af Amer: >60 mL/min (ref >60)
GLUCOSE: 149 mg/dL — AB (ref 65–99)
POTASSIUM: 3.7 mmol/L (ref 3.5–5.1)
Sodium: 141 mmol/L (ref 135–145)
Total Bilirubin: 0.4 mg/dL (ref 0.3–1.2)
Total Protein: 6.7 g/dL (ref 6.5–8.1)

## 2017-08-30 LAB — URINALYSIS, ROUTINE W REFLEX MICROSCOPIC
Bilirubin Urine: NEGATIVE
Glucose, UA: NEGATIVE mg/dL
KETONES UR: NEGATIVE mg/dL
LEUKOCYTES UA: NEGATIVE
Nitrite: NEGATIVE
Protein, ur: NEGATIVE mg/dL
Specific Gravity, Urine: 1.006 (ref 1.005–1.030)
pH: 6 (ref 5.0–8.0)

## 2017-08-30 LAB — CBC WITH DIFFERENTIAL/PLATELET
BASOS ABS: 0 10*3/uL (ref 0.0–0.1)
BASOS PCT: 0 %
EOS ABS: 0 10*3/uL (ref 0.0–0.7)
Eosinophils Relative: 0 %
HEMATOCRIT: 41.2 % (ref 36.0–46.0)
HEMOGLOBIN: 13.7 g/dL (ref 12.0–15.0)
Lymphocytes Relative: 9 %
Lymphs Abs: 1.1 10*3/uL (ref 0.7–4.0)
MCH: 29.8 pg (ref 26.0–34.0)
MCHC: 33.3 g/dL (ref 30.0–36.0)
MCV: 89.8 fL (ref 78.0–100.0)
MONOS PCT: 7 %
Monocytes Absolute: 0.9 10*3/uL (ref 0.1–1.0)
NEUTROS ABS: 11.2 10*3/uL — AB (ref 1.7–7.7)
NEUTROS PCT: 84 %
Platelets: 216 10*3/uL (ref 150–400)
RBC: 4.59 MIL/uL (ref 3.87–5.11)
RDW: 14.1 % (ref 11.5–15.5)
WBC: 13.3 10*3/uL — AB (ref 4.0–10.5)

## 2017-08-30 LAB — CBG MONITORING, ED: Glucose-Capillary: 149 mg/dL — ABNORMAL HIGH (ref 65–99)

## 2017-08-30 LAB — LACTIC ACID, PLASMA: LACTIC ACID, VENOUS: 1.6 mmol/L (ref 0.5–1.9)

## 2017-08-30 LAB — D-DIMER, QUANTITATIVE (NOT AT ARMC): D DIMER QUANT: 1.02 ug{FEU}/mL — AB (ref 0.00–0.50)

## 2017-08-30 MED ORDER — LORAZEPAM 2 MG/ML IJ SOLN: 1.0000 mg | Freq: Once | INTRAMUSCULAR | Status: DC

## 2017-08-30 MED ORDER — IOPAMIDOL (ISOVUE-370) INJECTION 76%
100.0000 mL | Freq: Once | INTRAVENOUS | Status: AC | PRN
  Administered 2017-08-30: 100 mL via INTRAVENOUS

## 2017-08-30 MED ORDER — SODIUM CHLORIDE 0.9 % IV BOLUS
500.0000 mL | Freq: Once | INTRAVENOUS | Status: AC
  Administered 2017-08-30: 500 mL via INTRAVENOUS

## 2017-08-30 MED ORDER — IOPAMIDOL (ISOVUE-370) INJECTION 76%
INTRAVENOUS | Status: AC
  Filled 2017-08-30: qty 100

## 2017-08-30 MED ORDER — LORAZEPAM 2 MG/ML IJ SOLN
2.0000 mg | Freq: Once | INTRAMUSCULAR | Status: AC
  Administered 2017-08-30: 2 mg via INTRAMUSCULAR
  Filled 2017-08-30: qty 1

## 2017-08-30 MED ORDER — SODIUM CHLORIDE 0.9 % IV SOLN
INTRAVENOUS | Status: DC
  Administered 2017-08-30: 18:00:00 via INTRAVENOUS

## 2017-08-30 NOTE — ED Notes (Signed)
Pt transported to CT ?

## 2017-08-30 NOTE — ED Notes (Addendum)
Pt starting to calm down but is agitated by tactile stimulation. Will start IV, get labs and hang fluids when pt is completely calm. Dr Rogene Houston aware

## 2017-08-30 NOTE — ED Notes (Signed)
PCXR done

## 2017-08-30 NOTE — ED Triage Notes (Signed)
Pt is present [facility], brought in with c/o change in behavior, declining food and being combative. Pt feels warm to touch and per caregiver is off her norm.

## 2017-08-30 NOTE — ED Provider Notes (Signed)
[facility] DEPT Provider Note   CSN: [id number] Arrival date & time: 08/30/17  1537     History   Chief Complaint Chief Complaint  Patient presents with  . Altered Mental Status  . Aggressive Behavior    HPI Paula Cantu is a 72 y.o. female.  Patient brought in from group home.  Patient has a past medical history significant for intellectual disability diabetes and also according to the group home paper paperwork.  Patient has a history of aggressive outburst.  However normally is pretty cooperative.  Not clear exactly what happened the group home.  Because the caregiver this with her currently did not witness this.  But apparently patient was not eating as much as usual.  Her original port was she was needing at all however she did eat some breakfast.  EMS was called out she got very agitated she does not like the medical personnel at all normally needs extra assistance to go to this to her regular doctor's visits.  So they did not bring her in.  Caregiver came and brought her in but patient was thinking she was going to lunch when she arrived here she got very agitated.  Was somewhat combative.  Patient did require some Ativan to sell her down so we could do the work-up.  Apparently patient was fine yesterday.     Past Medical History:  Diagnosis Date  . Arthritis   . Cancer (HCC)    endometrial  . Constipation   . Diabetes mellitus without complication ([place])    takes metformin  . Frequent falls   . Gait disorder   . Incontinence of bowel   . Incontinence of urine   . Intellectual disability    severe intellectual disability, non-verbal  . Osteopenia   . Seizures ([place])    remote history; possible febrile childhood seizure    Patient Active Problem List   Diagnosis Date Noted  . Falling 08/12/2017  . Idiopathic mental retardation 04/13/2013  . Breast cancer, left breast ([place]) 03/24/2013    Past Surgical History:  Procedure  Laterality Date  . ABDOMINAL HYSTERECTOMY    . BREAST LUMPECTOMY Left 03/04/2013   Procedure: LEFT BREAST LUMPECTOMY;  Surgeon: [name], MD;  Location: [facility];  Service: General;  Laterality: Left;  . RADIOLOGY WITH ANESTHESIA N/A 09/16/2016   Procedure: RADIOLOGY WITH ANESTHESIA MRI OF THE BRAIN WITH AND WITHOUT;  Surgeon: Radiologist, Medication, MD;  Location: [facility];  Service: Radiology;  Laterality: N/A;     OB History   None      Home Medications    Prior to Admission medications   Medication Sig Start Date End Date Taking? Authorizing Provider  amLODipine (NORVASC) 2.5 MG tablet Take 2.5 mg by mouth daily.   Yes [provider]  Cholecalciferol (VITAMIN D) 2000 units tablet Take 2,000 Units by mouth daily.   Yes [provider]  fenofibrate (TRICOR) 145 MG tablet Take 145 mg by mouth daily.   Yes [provider]  magic mouthwash SOLN Take 15 mLs by mouth 2 (two) times daily.   Yes [provider]  magnesium hydroxide (MILK OF MAGNESIA) 400 MG/5ML suspension Take 15 mLs by mouth every other day.   Yes [provider]  memantine (NAMENDA) 10 MG tablet Take 10 mg by mouth 2 (two) times daily.   Yes [provider]  metFORMIN (GLUCOPHAGE) 850 MG tablet Take 850 mg by mouth daily with breakfast.   Yes  [provider]  sennosides-docusate sodium (SENOKOT-S) 8.6-50 MG tablet Take 2 tablets by mouth at bedtime.   Yes [provider]  tamoxifen (NOLVADEX) 20 MG tablet Take 20 mg by mouth daily.   Yes [provider]  vitamin C (ASCORBIC ACID) 500 MG tablet Take 500 mg by mouth 2 (two) times daily.   Yes [provider]  ALPRAZolam Duanne Moron) 1 MG tablet Take 1 tablet (1 mg total) by mouth at bedtime as needed (CT). 08/12/17   Marcial Pacas, MD  menthol-zinc oxide (GOLD BOND) powder Apply 1 application topically 2 (two) times daily. Apply under breasts    [provider]  sennosides-docusate  sodium (SENOKOT-S) 8.6-50 MG tablet Take 2 tablets by mouth daily.    [provider]    Family History History reviewed. No pertinent family history.  Social History Social History   Tobacco Use  . Smoking status: Never Smoker  . Smokeless tobacco: Never Used  Substance Use Topics  . Alcohol use: No  . Drug use: No     Allergies   Lactose intolerance (gi); Bacillus; and Ppd [tuberculin purified protein derivative]   Review of Systems Review of Systems  Unable to perform ROS: Mental status change     Physical Exam Updated Vital Signs BP (!) 161/150 (BP Location: Right Leg)   Pulse (!) 144   Resp (!) 23   SpO2 98%   Physical Exam  Constitutional: She appears well-developed and well-nourished. No distress.  HENT:  Head: Normocephalic and atraumatic.  Mucous membranes slightly dry.  Eyes: Pupils are equal, round, and reactive to light. Conjunctivae and EOM are normal.  Neck: Normal range of motion. Neck supple.  Cardiovascular:  Tachycardic  Pulmonary/Chest: Effort normal and breath sounds normal. No respiratory distress.  Abdominal: Soft. Bowel sounds are normal. There is no tenderness.  Musculoskeletal: Normal range of motion. She exhibits no edema.  Neurological: She is alert.  Skin: Skin is warm. No rash noted.  Nursing note and vitals reviewed.    ED Treatments / Results  Labs (all labs ordered are listed, but only abnormal results are displayed) Labs Reviewed  CBG MONITORING, ED - Abnormal; Notable for the following components:      Result Value   Glucose-Capillary 149 (*)    All other components within normal limits  CBC WITH DIFFERENTIAL/PLATELET  COMPREHENSIVE METABOLIC PANEL  URINALYSIS, ROUTINE W REFLEX MICROSCOPIC  LACTIC ACID, PLASMA    EKG None   ED ECG REPORT   Date: 08/30/2017  Rate: 138  Rhythm: sinus tachycardia  QRS Axis: left  Intervals: normal  ST/T Wave abnormalities: normal  Conduction Disutrbances:none   Narrative Interpretation:   Old EKG Reviewed: none available  I have personally reviewed the EKG tracing and agree with the computerized printout as noted.   Radiology Dg Chest Port 1 View  Result Date: 08/30/2017 CLINICAL DATA:  Altered mental status. EXAM: PORTABLE CHEST 1 VIEW COMPARISON:  None. FINDINGS: Heart size and mediastinal contours are within normal limits. Probable mild atelectasis and/or scarring at the LEFT lung base. Lungs otherwise clear. No pleural effusion. No acute or suspicious osseous finding. IMPRESSION: No active disease. Electronically Signed   By: Franki Cabot M.D.   On: 08/30/2017 16:24    Procedures Procedures (including critical care time)  Medications Ordered in ED Medications  0.9 %  sodium chloride infusion (has no administration in time range)  sodium chloride 0.9 % bolus 500 mL (500 mLs Intravenous New Bag/Given 08/30/17 1737)  LORazepam (  ATIVAN) injection 2 mg (2 mg Intramuscular Given 08/30/17 1622)     Initial Impression / Assessment and Plan / ED Course  I have reviewed the triage vital signs and the nursing notes.  Pertinent labs & imaging results that were available during my care of the patient were reviewed by me and considered in my medical decision making (see chart for details).    Patient with an extensive work-up here.  Initial work-up to include head CT and chest x-ray and basic labs without acute findings.  Clinically thought that perhaps she would have a urinary tract infection.  There was delays in getting some of her urine.  Her urinalysis came back completely normal.  Patient did receive Ativan early on patient then settled down.  But she did have a persistent baseline tachycardia like 100-1 10.  So d-dimer was done on it was elevated at 1 CT angios done some artifact due to movement which is understandable and in this patient but no evidence of any large vessel of pulmonary embolus.  Patient stable for discharge back her caregiver is  willing to take her back to the group home.  Patient may have just become more agitated by being in a medical environment.  Unable to find anything that required admission.   Final Clinical Impressions(s) / ED Diagnoses   Final diagnoses:  None    ED Discharge Orders    None       Fredia Sorrow, MD 08/31/17 [phone]

## 2017-08-30 NOTE — ED Notes (Signed)
Pt very calm at this time, no longer noted to be combative or agiatated at this time. IVF initiated per MD orders, placed on infusion pump at rate as per MD orders, Fluid bolus of 540ml being administered.

## 2017-08-30 NOTE — ED Notes (Signed)
Lighting in room dimmed, pt appears more calm at this time, CMS of upper and lower extremities remain WNL with soft safety devices in place

## 2017-08-30 NOTE — ED Notes (Signed)
ED Provider at bedside. 

## 2017-08-30 NOTE — ED Notes (Signed)
Pt arrived into exam room #17 from triage, caregiver with pt to provide med hx and other info. Pt very combative, does not verbally express herself, attempting to bite and grab at ED staff. Pt safety measures immediately implemented. Placed onto ED stretcher, sr x 2 up, bed in lowest position, EDP at bedside with verbal orders to provide 4 point soft safety devices. Applied x 4, CMS immediately reassessed. Orders from EDP received.

## 2017-08-30 NOTE — ED Notes (Signed)
Pt safety measures remain in place, pt frequently observed by ED staff. Pt is less agiatated, easily arousable since IM med administered.

## 2017-08-30 NOTE — ED Notes (Addendum)
Pt urinated through brief. Cleaned and changed pt. Placed purwick for urine sample collection. Pt ankle restraints removed d/t pt reduced agitation

## 2017-08-30 NOTE — ED Notes (Signed)
Urine collection delayed.  Pt wears a brief

## 2017-08-30 NOTE — ED Notes (Signed)
Upon arrival, pt was combative and non-directable. Pt was placed back into bed by multiple staff members and placed in soft restraints per verbal order.

## 2017-08-30 NOTE — ED Notes (Signed)
Safety measures remain in place, pts personal caregiver at bedside with pt. Safety measures remain in place with sr x 2 up, on cardiac monitor, door of room remains open for very frequent observations. Stretcher in lowest position.

## 2017-08-30 NOTE — ED Notes (Signed)
EKG and labs delayed until RN is able to settle pt.

## 2017-08-31 DIAGNOSIS — R4689 Other symptoms and signs involving appearance and behavior: Secondary | ICD-10-CM | POA: Diagnosis not present

## 2017-08-31 NOTE — Discharge Instructions (Addendum)
Extensive work-up here without any acute findings.  Patient has been calm and cooperative.  No evidence of any infection anywhere CT head negative also did CT of chest to rule out blood clot in the lungs also negative.  Patient stable for return back to group home.  Return for any new or worse symptoms.

## 2017-08-31 NOTE — ED Notes (Signed)
Provider aware of pulse

## 2017-09-01 DIAGNOSIS — R5383 Other fatigue: Secondary | ICD-10-CM | POA: Diagnosis not present

## 2017-09-01 DIAGNOSIS — F79 Unspecified intellectual disabilities: Secondary | ICD-10-CM | POA: Diagnosis not present

## 2017-09-01 DIAGNOSIS — E119 Type 2 diabetes mellitus without complications: Secondary | ICD-10-CM | POA: Diagnosis not present

## 2017-09-01 DIAGNOSIS — G40919 Epilepsy, unspecified, intractable, without status epilepticus: Secondary | ICD-10-CM | POA: Diagnosis not present

## 2017-09-14 ENCOUNTER — Encounter (HOSPITAL_COMMUNITY): Payer: Self-pay | Admitting: *Deleted

## 2017-09-14 NOTE — Pre-Procedure Instructions (Signed)
    Paula Cantu  09/14/2017     Your procedure is scheduled on Tuesday, July 2.  Report to [facility] Admitting at 6:45 AM    Call this number if you have problems the morning of surgery: [phone]    Remember:  Do not eat or drink after midnight.      Take these medicines the morning of surgery with A SIP OF WATER :  Tamoxifen Namenda Amlodipine  DO NOT GIVE Metformin the morning of surgery.  Please bring Medication ListP with the medications given documented. Please bring a copy of POA papers.   Do not wear jewelry, make-up or nail polish.  Do not wear lotions, powders, or perfumes, or deodorant.  Do not shave 48 hours prior to surgery.  Men may shave face and neck.  Do not bring valuables to the hospital.  [facility] is not responsible for any belongings or valuables.

## 2017-09-14 NOTE — Progress Notes (Signed)
I spoke with Richardine Service RN with  [facility]. Doreen and I reviewed patient's medications and Doren said patient can take medications when she returns to facility.  Patient does take some medications in food.  Doren checked with staff, who reported that patient's CBGs run around 110, they have never seen one under 51. I instructed Doren to have CBG check 1st thing in am and everyto check CBG after awaking and if     CBG is less than 70 to drink 1/2 cup of a clear juice. Recheck CBG in 15 minutes then call pre- op desk at [phone] for further instructions. Doren reports that patient can become agitated when her scheduled is changed, so patient;s preferred staff will accompany her.  POA is patient's cousin [name] who resides in [place].  I called Ms Gilford Rile and left a message on voice mail informing her that we will need a phone consent from her for studies to be done.I am awaiting her return call.

## 2017-09-15 ENCOUNTER — Ambulatory Visit (HOSPITAL_COMMUNITY)
Admission: RE | Admit: 2017-09-15 | Discharge: 2017-09-15 | Disposition: A | Discharge status: Home / Self Care | Payer: Medicare Other | Source: Ambulatory Visit | Attending: Neurology | Admitting: Neurology

## 2017-09-15 ENCOUNTER — Encounter (HOSPITAL_COMMUNITY): Admission: RE | Disposition: A | Discharge status: Home / Self Care | Payer: Self-pay | Source: Ambulatory Visit

## 2017-09-15 ENCOUNTER — Ambulatory Visit (HOSPITAL_COMMUNITY): Payer: Medicare Other | Admitting: Certified Registered Nurse Anesthetist

## 2017-09-15 ENCOUNTER — Telehealth: Payer: Self-pay | Admitting: Neurology

## 2017-09-15 ENCOUNTER — Other Ambulatory Visit: Payer: Self-pay

## 2017-09-15 ENCOUNTER — Encounter (HOSPITAL_COMMUNITY): Payer: Self-pay | Admitting: Surgery

## 2017-09-15 DIAGNOSIS — F79 Unspecified intellectual disabilities: Secondary | ICD-10-CM | POA: Insufficient documentation

## 2017-09-15 DIAGNOSIS — G3281 Cerebellar ataxia in diseases classified elsewhere: Secondary | ICD-10-CM

## 2017-09-15 DIAGNOSIS — R270 Ataxia, unspecified: Secondary | ICD-10-CM | POA: Diagnosis not present

## 2017-09-15 DIAGNOSIS — Z7984 Long term (current) use of oral hypoglycemic drugs: Secondary | ICD-10-CM | POA: Insufficient documentation

## 2017-09-15 DIAGNOSIS — M4802 Spinal stenosis, cervical region: Secondary | ICD-10-CM | POA: Diagnosis not present

## 2017-09-15 DIAGNOSIS — J209 Acute bronchitis, unspecified: Secondary | ICD-10-CM | POA: Diagnosis not present

## 2017-09-15 DIAGNOSIS — R05 Cough: Secondary | ICD-10-CM | POA: Diagnosis not present

## 2017-09-15 DIAGNOSIS — Z79899 Other long term (current) drug therapy: Secondary | ICD-10-CM | POA: Insufficient documentation

## 2017-09-15 DIAGNOSIS — E119 Type 2 diabetes mellitus without complications: Secondary | ICD-10-CM | POA: Insufficient documentation

## 2017-09-15 DIAGNOSIS — M50223 Other cervical disc displacement at C6-C7 level: Secondary | ICD-10-CM | POA: Diagnosis not present

## 2017-09-15 DIAGNOSIS — G119 Hereditary ataxia, unspecified: Secondary | ICD-10-CM | POA: Diagnosis not present

## 2017-09-15 DIAGNOSIS — R569 Unspecified convulsions: Secondary | ICD-10-CM | POA: Diagnosis not present

## 2017-09-15 DIAGNOSIS — M50322 Other cervical disc degeneration at C5-C6 level: Secondary | ICD-10-CM | POA: Insufficient documentation

## 2017-09-15 HISTORY — PX: RADIOLOGY WITH ANESTHESIA: SHX6223

## 2017-09-15 LAB — GLUCOSE, CAPILLARY
Glucose-Capillary: 136 mg/dL — ABNORMAL HIGH (ref 70–99)
Glucose-Capillary: 97 mg/dL (ref 70–99)

## 2017-09-15 SURGERY — MRI WITH ANESTHESIA
Anesthesia: General

## 2017-09-15 MED ORDER — MIDAZOLAM HCL 2 MG/2ML IJ SOLN
INTRAMUSCULAR | Status: DC | PRN
  Administered 2017-09-15: 2 mg via INTRAVENOUS

## 2017-09-15 MED ORDER — GADOBENATE DIMEGLUMINE 529 MG/ML IV SOLN
15.0000 mL | Freq: Once | INTRAVENOUS | Status: AC | PRN
  Administered 2017-09-15: 15 mL via INTRAVENOUS

## 2017-09-15 NOTE — Transfer of Care (Signed)
Immediate Anesthesia Transfer of Care Note  Patient: LARRI BREWTON  Procedure(s) Performed: MRI WITH ANESTHESIA OF THE BRAIN WITH AND WITHOUT AND CERVICAL WITHOUT CONTRAST (N/A )  Patient Location: PACU  Anesthesia Type:General  Level of Consciousness: awake, moving all extremities.  Does not follow commands.  Non verbal with mental handicap.  Airway & Oxygen Therapy: Patient Spontanous Breathing and Patient connected to face mask oxygen  Post-op Assessment: Report given to RN and Post -op Vital signs reviewed and stable  Post vital signs: Reviewed and stable  Last Vitals:  Vitals Value Taken Time  BP 140/96 09/15/2017 11:52 AM  Temp 36.5 C 09/15/2017 11:52 AM  Pulse 125 09/15/2017 11:52 AM  Resp 19 09/15/2017 11:52 AM  SpO2 96 % 09/15/2017 11:52 AM    Last Pain:  Vitals:   09/15/17 1152  PainSc: 0-No pain      Patients Stated Pain Goal: 0 (16/60/60 0459)  Complications: No apparent anesthesia complications

## 2017-09-15 NOTE — Anesthesia Preprocedure Evaluation (Signed)
Anesthesia Evaluation  Patient identified by MRN, date of birth, ID band Patient confused    Reviewed: Allergy & Precautions, NPO status , Patient's Chart, lab work & pertinent test results  History of Anesthesia Complications Negative for: history of anesthetic complications  Airway Mallampati: I  TM Distance: >3 FB Neck ROM: Full    Dental  (+) Edentulous Upper, Edentulous Lower, Dental Advisory Given   Pulmonary neg pulmonary ROS,    Pulmonary exam normal        Cardiovascular negative cardio ROS Normal cardiovascular exam     Neuro/Psych Seizures -, Well Controlled,  Intellectual impairment from birth negative psych ROS   GI/Hepatic negative GI ROS, Neg liver ROS,   Endo/Other  diabetes  Renal/GU negative Renal ROS     Musculoskeletal negative musculoskeletal ROS (+)   Abdominal   Peds  Hematology negative hematology ROS (+)   Anesthesia Other Findings Day of surgery medications reviewed with the patient.  Reproductive/Obstetrics                             Anesthesia Physical  Anesthesia Plan  ASA: III  Anesthesia Plan: General   Post-op Pain Management:    Induction: Intravenous  PONV Risk Score and Plan: 3 and Ondansetron, Dexamethasone and Treatment may vary due to age or medical condition  Airway Management Planned: LMA  Additional Equipment:   Intra-op Plan:   Post-operative Plan: Extubation in OR  Informed Consent: I have reviewed the patients History and Physical, chart, labs and discussed the procedure including the risks, benefits and alternatives for the proposed anesthesia with the patient or authorized representative who has indicated his/her understanding and acceptance.   Dental advisory given  Plan Discussed with: CRNA and Anesthesiologist  Anesthesia Plan Comments:         Anesthesia Quick Evaluation

## 2017-09-15 NOTE — Anesthesia Procedure Notes (Signed)
Procedure Name: Intubation Date/Time: 09/15/2017 10:02 AM Performed by: Colin Benton, CRNA Pre-anesthesia Checklist: Patient identified, Emergency Drugs available, Suction available and Patient being monitored Patient Re-evaluated:Patient Re-evaluated prior to induction Oxygen Delivery Method: Circle system utilized Preoxygenation: Pre-oxygenation with 100% oxygen Induction Type: IV induction Ventilation: Mask ventilation without difficulty Laryngoscope Size: Miller and 2 Grade View: Grade I Tube type: Oral Tube size: 7.0 mm Number of attempts: 1 Airway Equipment and Method: Stylet Placement Confirmation: ETT inserted through vocal cords under direct vision,  positive ETCO2 and breath sounds checked- equal and bilateral Secured at: 21 cm Tube secured with: Tape Dental Injury: Teeth and Oropharynx as per pre-operative assessment

## 2017-09-15 NOTE — Anesthesia Postprocedure Evaluation (Signed)
Anesthesia Post Note  Patient: Paula Cantu  Procedure(s) Performed: MRI WITH ANESTHESIA OF THE BRAIN WITH AND WITHOUT AND CERVICAL WITHOUT CONTRAST (N/A )     Patient location during evaluation: PACU Anesthesia Type: General Level of consciousness: awake and alert Pain management: pain level controlled Vital Signs Assessment: post-procedure vital signs reviewed and stable Respiratory status: spontaneous breathing, nonlabored ventilation, respiratory function stable and patient connected to nasal cannula oxygen Cardiovascular status: blood pressure returned to baseline and stable Postop Assessment: no apparent nausea or vomiting Anesthetic complications: no    Last Vitals:  Vitals:   09/15/17 1208 09/15/17 1233  BP: 127/87 131/80  Pulse: (!) 135 (!) 134  Resp: (!) 21 20  Temp: 36.5 C 36.5 C  SpO2: 99% 98%    Last Pain:  Vitals:   09/15/17 1215  PainSc: 0-No pain                 Tiajuana Amass

## 2017-09-16 ENCOUNTER — Telehealth: Payer: Self-pay | Admitting: Neurology

## 2017-09-16 ENCOUNTER — Encounter (HOSPITAL_COMMUNITY): Payer: Self-pay | Admitting: Radiology

## 2017-09-16 DIAGNOSIS — H6690 Otitis media, unspecified, unspecified ear: Secondary | ICD-10-CM

## 2017-09-16 MED FILL — Sugammadex Sodium IV 200 MG/2ML (Base Equivalent): INTRAVENOUS | Qty: 2 | Status: AC

## 2017-09-16 MED FILL — Lactated Ringer's Solution: INTRAVENOUS | Qty: 500 | Status: AC

## 2017-09-16 MED FILL — Ondansetron HCl Inj 4 MG/2ML (2 MG/ML): INTRAMUSCULAR | Qty: 2 | Status: AC

## 2017-09-16 MED FILL — Rocuronium Bromide IV Soln 50 MG/5ML (10 MG/ML): INTRAVENOUS | Qty: 5 | Status: AC

## 2017-09-16 MED FILL — Phenylephrine-NaCl Pref Syr 0.4 MG/10ML-0.9% (40 MCG/ML): INTRAVENOUS | Qty: 10 | Status: AC

## 2017-09-16 MED FILL — Lidocaine HCl(Cardiac) IV PF Soln Pref Syr 100 MG/5ML (2%): INTRAVENOUS | Qty: 5 | Status: AC

## 2017-09-16 MED FILL — Propofol IV Emul 200 MG/20ML (10 MG/ML): INTRAVENOUS | Qty: 20 | Status: AC

## 2017-09-16 NOTE — Telephone Encounter (Signed)
error 

## 2017-09-16 NOTE — Telephone Encounter (Addendum)
Patient resides at [facility].  Left message for the nurse to return my call.

## 2017-09-16 NOTE — Telephone Encounter (Signed)
Please call patient, MRI of the brain showed no acute intracranial abnormality, but there is evidence of large chronic left mastoid and left mid ear effusion, this might explain her dizziness falling spells,I have referred her to see a ENT physician,  MRI of cervical spine showed multilevel degenerative changes, but there was no evidence of spinal cord compression   IMPRESSION: 1. No acute intracranial abnormality. 2. Mild chronic small vessel ischemic disease, unchanged from 2018. 3. Large chronic left mastoid and left middle ear effusions. 4. Diffuse cervical disc degeneration, greatest at C5-6 where there is moderate spinal stenosis and cord flattening. No abnormal cord Signal.  IMPRESSION: 1. No acute intracranial abnormality. 2. Mild chronic small vessel ischemic disease, unchanged from 2018. 3. Large chronic left mastoid and left middle ear effusions. 4. Diffuse cervical disc degeneration, greatest at C5-6 where there is moderate spinal stenosis and cord flattening. No abnormal cord signal.

## 2017-09-16 NOTE — Telephone Encounter (Signed)
I called [facility] [phone]) again this afternoon and spoke to the clinical staff involved in patient's care.  Provided verbal results, for MRI brain and cervical spine, to [facility].  Results were also faxed to her attention at [phone].    She is aware that ENT will be calling to schedule the patient an appointment.

## 2017-09-23 ENCOUNTER — Telehealth: Payer: Self-pay | Admitting: Neurology

## 2017-09-23 NOTE — H&P (Signed)
error 

## 2017-09-23 NOTE — Telephone Encounter (Signed)
Sent to Dr. Ernesto Rutherford Telephone [phone]- fax [phone]

## 2017-09-24 NOTE — Telephone Encounter (Signed)
Wes from Dr Berle Mull office is asking for a call back re: referral sent to the office

## 2017-09-30 DIAGNOSIS — H6521 Chronic serous otitis media, right ear: Secondary | ICD-10-CM | POA: Diagnosis not present

## 2017-09-30 DIAGNOSIS — H6691 Otitis media, unspecified, right ear: Secondary | ICD-10-CM | POA: Diagnosis not present

## 2017-09-30 DIAGNOSIS — H6062 Unspecified chronic otitis externa, left ear: Secondary | ICD-10-CM | POA: Diagnosis not present

## 2017-09-30 NOTE — Telephone Encounter (Signed)
I have talked to Wes A few times now and and I have left messages for numbers we have on file waiting for a call back.

## 2017-10-12 ENCOUNTER — Ambulatory Visit: Payer: Medicare Other | Admitting: Neurology

## 2017-10-14 DIAGNOSIS — H6061 Unspecified chronic otitis externa, right ear: Secondary | ICD-10-CM | POA: Diagnosis not present

## 2017-10-14 DIAGNOSIS — H6691 Otitis media, unspecified, right ear: Secondary | ICD-10-CM | POA: Diagnosis not present

## 2017-10-14 DIAGNOSIS — H6121 Impacted cerumen, right ear: Secondary | ICD-10-CM | POA: Diagnosis not present

## 2017-10-20 DIAGNOSIS — M4802 Spinal stenosis, cervical region: Secondary | ICD-10-CM | POA: Diagnosis not present

## 2017-10-20 DIAGNOSIS — F79 Unspecified intellectual disabilities: Secondary | ICD-10-CM | POA: Diagnosis not present

## 2017-10-20 DIAGNOSIS — H6090 Unspecified otitis externa, unspecified ear: Secondary | ICD-10-CM | POA: Diagnosis not present

## 2017-10-27 DIAGNOSIS — E119 Type 2 diabetes mellitus without complications: Secondary | ICD-10-CM | POA: Diagnosis not present

## 2017-10-27 DIAGNOSIS — G40909 Epilepsy, unspecified, not intractable, without status epilepticus: Secondary | ICD-10-CM | POA: Diagnosis not present

## 2017-10-27 DIAGNOSIS — K5900 Constipation, unspecified: Secondary | ICD-10-CM | POA: Diagnosis not present

## 2017-10-27 DIAGNOSIS — F79 Unspecified intellectual disabilities: Secondary | ICD-10-CM | POA: Diagnosis not present

## 2017-10-28 DIAGNOSIS — E119 Type 2 diabetes mellitus without complications: Secondary | ICD-10-CM | POA: Diagnosis not present

## 2017-10-28 DIAGNOSIS — C50312 Malignant neoplasm of lower-inner quadrant of left female breast: Secondary | ICD-10-CM | POA: Diagnosis not present

## 2017-10-28 DIAGNOSIS — F79 Unspecified intellectual disabilities: Secondary | ICD-10-CM | POA: Diagnosis not present

## 2017-10-28 DIAGNOSIS — H2513 Age-related nuclear cataract, bilateral: Secondary | ICD-10-CM | POA: Diagnosis not present

## 2017-11-24 ENCOUNTER — Ambulatory Visit (INDEPENDENT_AMBULATORY_CARE_PROVIDER_SITE_OTHER): Payer: Medicare Other | Admitting: Neurology

## 2017-11-24 ENCOUNTER — Encounter: Payer: Self-pay | Admitting: Neurology

## 2017-11-24 DIAGNOSIS — E119 Type 2 diabetes mellitus without complications: Secondary | ICD-10-CM | POA: Diagnosis not present

## 2017-11-24 DIAGNOSIS — F79 Unspecified intellectual disabilities: Secondary | ICD-10-CM | POA: Diagnosis not present

## 2017-11-24 DIAGNOSIS — R296 Repeated falls: Secondary | ICD-10-CM | POA: Diagnosis not present

## 2017-11-24 DIAGNOSIS — M8580 Other specified disorders of bone density and structure, unspecified site: Secondary | ICD-10-CM | POA: Diagnosis not present

## 2017-11-24 DIAGNOSIS — E559 Vitamin D deficiency, unspecified: Secondary | ICD-10-CM | POA: Diagnosis not present

## 2017-11-24 DIAGNOSIS — M1990 Unspecified osteoarthritis, unspecified site: Secondary | ICD-10-CM | POA: Diagnosis not present

## 2017-11-24 MED ORDER — DIVALPROEX SODIUM 125 MG PO CSDR: 250.0000 mg | DELAYED_RELEASE_CAPSULE | Freq: Two times a day (BID) | ORAL | 11 refills | Status: DC

## 2017-11-24 NOTE — Progress Notes (Signed)
PATIENT: Paula Cantu DOB: [birth year]  Chief Complaint  Patient presents with  . Falling    She resides at [facility] and is here with one of the staff members, Paula Cantu.  She was able to complete her MRI scans.  She was also seen by ENT.  Per Paula Cantu, nothing significant noted on her ENT exam.      HISTORICAL  Paula Cantu is a 72 year old female, seen in refer by her primary care doctor [place], Melanee Left for evaluation normality, initial evaluation was on Aug 12, 2017  She has been a resident of group home for a long time, there was no family member involving her care, she currently resident at [facility] health service, is accompanied by 2 aides Paula Cantu from facility at the visit, they have known her  for more than 3 years.  She has past medical history of diabetes, mentally retarded, mute, follows simple commands, such as using bathroom, washing her hands, she eats well, sleeps well, able to ambulate without assistance.  There was seizure documented in her past medical history, but staff has never witnessed any seizure activity, there is no antiepileptics at her current medication list.  Since March 2019, she had episode of sudden onset falling, without clear reasons, she was standing, and suddenly fell to the ground, then she is able to get up by herself again, there was no seizure-like activity, no loss of consciousness, no self injury, it happened intermittently, once every 1 or 2 weeks.  Reviewed the record, MRI of the brain with without contrast in July 2018 for evaluation of fall, showed mild generalized atrophy, supratentorium small vessel disease, no acute findings, extensive left mastoid effusion,  History of breast cancer  UPDATE Sept 10 2019: She is accompanied by her group home Paula Cantu at visit.  She continue have spells of sudden falling without loss of consciousness, without seizure activity observed, she is severely mentally retarded, following commands, happened about  once a month.  I have personally reviewed MRI of the brain in July 2019: No acute abnormality, mild small vessel disease, there is a large chronic left mastoid and left mid ear infusion, diffuse cervical degenerative disc degeneration, most severe at C5-6, with moderate spinal canal stenosis, cord flattening, no abnormal cord signal changes.  Laboratory evaluation seen June 2019, normal CMP with glucose of 149, CBC, WBC was elevated 13.3,   REVIEW OF SYSTEMS: Full 14 system review of systems performed and notable only for as above.  ALLERGIES: Allergies  Allergen Reactions  . Lactose Intolerance (Gi) Other (See Comments)    On nursing home records  . Bacillus Other (See Comments)    UNSPECIFIED REACTION   . Ppd [Tuberculin Purified Protein Derivative] Other (See Comments)    UNSPECIFIED REACTION  On nursing home records    HOME MEDICATIONS: Current Outpatient Medications  Medication Sig Dispense Refill  . Cholecalciferol (VITAMIN D) 2000 units tablet Take 2,000 Units by mouth daily.    . fenofibrate (TRICOR) 145 MG tablet Take 145 mg by mouth daily.    . magnesium hydroxide (MILK OF MAGNESIA) 400 MG/5ML suspension Take 15 mLs by mouth every other day.    . menthol-zinc oxide (GOLD BOND) powder Apply 1 application topically 2 (two) times daily. Apply under breasts    . metFORMIN (GLUCOPHAGE) 850 MG tablet Take 850 mg by mouth daily with breakfast.    . sennosides-docusate sodium (SENOKOT-S) 8.6-50 MG tablet Take 2 tablets by mouth at bedtime.    Marland Kitchen  tamoxifen (NOLVADEX) 20 MG tablet Take 20 mg by mouth daily.    . vitamin C (ASCORBIC ACID) 500 MG tablet Take 500 mg by mouth 2 (two) times daily.     No current facility-administered medications for this visit.     PAST MEDICAL HISTORY: Past Medical History:  Diagnosis Date  . Arthritis   . Cancer (HCC)    endometrial  . Constipation   . Diabetes mellitus without complication ([place])    takes metformin  . Frequent falls   . Gait  disorder   . Incontinence of bowel   . Incontinence of urine   . Intellectual disability    severe intellectual disability, non-verbal  . Osteopenia   . Seizures ([place])    remote history; possible febrile childhood seizure    PAST SURGICAL HISTORY: Past Surgical History:  Procedure Laterality Date  . ABDOMINAL HYSTERECTOMY    . BREAST LUMPECTOMY Left 03/04/2013   Procedure: LEFT BREAST LUMPECTOMY;  Surgeon: [name], MD;  Location: [place];  Service: General;  Laterality: Left;  . RADIOLOGY WITH ANESTHESIA N/A 09/16/2016   Procedure: RADIOLOGY WITH ANESTHESIA MRI OF THE BRAIN WITH AND WITHOUT;  Surgeon: Radiologist, Medication, MD;  Location: [place];  Service: Radiology;  Laterality: N/A;  . RADIOLOGY WITH ANESTHESIA N/A 09/15/2017   Procedure: MRI WITH ANESTHESIA OF THE BRAIN WITH AND WITHOUT AND CERVICAL WITHOUT CONTRAST;  Surgeon: Radiologist, Medication, MD;  Location: [place];  Service: Radiology;  Laterality: N/A;    FAMILY HISTORY: History reviewed. No pertinent family history.  SOCIAL HISTORY:  Social History   Socioeconomic History  . Marital status: Single    Spouse name: Not on file  . Number of children: 0  . Years of education: Not on file  . Highest education level: Not on file  Occupational History  . Occupation: Disabled  Social Needs  . Financial resource strain: Not on file  . Food insecurity:    Worry: Not on file    Inability: Not on file  . Transportation needs:    Medical: Not on file    Non-medical: Not on file  Tobacco Use  . Smoking status: Never Smoker  . Smokeless tobacco: Never Used  Substance and Sexual Activity  . Alcohol use: No  . Drug use: No  . Sexual activity: Not on file  Lifestyle  . Physical activity:    Days per week: Not on file    Minutes per session: Not on file  . Stress: Not on file  Relationships  . Social connections:    Talks on phone: Not on file    Gets together: Not on file    Attends religious service: Not  on file    Active member of club or organization: Not on file    Attends meetings of clubs or organizations: Not on file    Relationship status: Not on file  . Intimate partner violence:    Fear of current or ex partner: Not on file    Emotionally abused: Not on file    Physically abused: Not on file    Forced sexual activity: Not on file  Other Topics Concern  . Not on file  Social History Narrative   Lives at [facility].   She is ambidextrous.   Occasional caffeine use.     PHYSICAL EXAM   Vitals:    Not recorded      There is no height or weight on file to calculate BMI.  PHYSICAL EXAMNIATION:  Gen: NAD, conversant, well nourised, obese, well groomed                     Cardiovascular: Regular rate rhythm, no peripheral edema, warm, nontender. Eyes: Conjunctivae clear without exudates or hemorrhage Neck: Supple, no carotid bruits. Pulmonary: Clear to auscultation bilaterally   NEUROLOGICAL EXAM:  MENTAL STATUS: Speech/Cognition: Awake, restless, wants to leave exam room, follow up simple one step command. She does not talk   CRANIAL NERVES: CN II: Visual fields are full to confrontation.  Pupils are round equal and briskly reactive to light. CN III, IV, VI: extraocular movement are normal. No ptosis. CN V: Facial sensation is intact to pinprick in all 3 divisions bilaterally. Corneal responses are intact.  CN VII: Face is symmetric with normal eye closure and smile. CN VIII: Hearing is normal to rubbing fingers CN IX, X: Palate elevates symmetrically. Phonation is normal. CN XI: Head turning and shoulder shrug are intact CN XII: Tongue is midline with normal movements and no atrophy.  MOTOR: She move all 4 limbs without difficulty  REFLEXES: Reflexes are 2+ and symmetric at the biceps, triceps, knees, and ankles. Plantar responses are flexor.  SENSORY: Withdraw to pain  COORDINATION: No trunk or limb dysmetria noted.   GAIT/STANCE: Deferred  DIAGNOSTIC  DATA (LABS, IMAGING, TESTING) - I reviewed patient records, labs, notes, testing and imaging myself where available.   ASSESSMENT AND PLAN  Paula Cantu is a 72 y.o. female   Mental retardation, falling episode History of breast cancer,  Status post left lobectomy in December 2014, invasion of anterior margin, lymphovascular invasion is identified, ductal carcinoma  Potential etiologies include seizure,  Mild abnormality on MRI brain and cervical spine would not explain her sudden falling episode,  Currently treated with Depakote sprinkles 125 mg 2 tablets twice a day   Marcial Pacas, M.D. Ph.D.  [facility] [address] [place], [place] [address] Ph: [phone] Fax: [phone]  CC: Trinidad Curet

## 2017-12-01 DIAGNOSIS — R269 Unspecified abnormalities of gait and mobility: Secondary | ICD-10-CM | POA: Diagnosis not present

## 2017-12-01 DIAGNOSIS — R05 Cough: Secondary | ICD-10-CM | POA: Diagnosis not present

## 2017-12-01 DIAGNOSIS — C50312 Malignant neoplasm of lower-inner quadrant of left female breast: Secondary | ICD-10-CM | POA: Diagnosis not present

## 2017-12-01 DIAGNOSIS — Z6839 Body mass index (BMI) 39.0-39.9, adult: Secondary | ICD-10-CM | POA: Diagnosis not present

## 2017-12-02 ENCOUNTER — Telehealth: Payer: Self-pay

## 2017-12-02 NOTE — Telephone Encounter (Signed)
Received call from [facility] regarding scheduling an appt for yearly follow up on pt tamoxifen. Scheduled pt for October 2019. Confirmed time/date.

## 2017-12-04 ENCOUNTER — Telehealth: Payer: Self-pay | Admitting: *Deleted

## 2017-12-04 NOTE — Telephone Encounter (Signed)
Received a phone call from Caren Griffins (caregiver at [facility] where patient resides).  Patient was seen by Dr. Krista Blue on 11/24/17 and started on Depakote sprinkles 125mg , two capsules BID.    They have observed the patient having increased drooling to the point of needing to wear a bib. With further questioning, it was also determined that the patient is experiencing some upper respiratory issues and has been started on Claritin this week.    Dr. Krista Blue has reviewed this information.  No changes needed to her Depakote dosage.   They will call back with any other concerns.

## 2017-12-14 ENCOUNTER — Telehealth: Payer: Self-pay | Admitting: Adult Health

## 2017-12-14 NOTE — Telephone Encounter (Signed)
Patient called requested a morning and reschedule

## 2017-12-16 DIAGNOSIS — Z79899 Other long term (current) drug therapy: Secondary | ICD-10-CM | POA: Diagnosis not present

## 2017-12-22 DIAGNOSIS — M1990 Unspecified osteoarthritis, unspecified site: Secondary | ICD-10-CM | POA: Diagnosis not present

## 2017-12-22 DIAGNOSIS — F79 Unspecified intellectual disabilities: Secondary | ICD-10-CM | POA: Diagnosis not present

## 2017-12-22 DIAGNOSIS — Z6841 Body Mass Index (BMI) 40.0 and over, adult: Secondary | ICD-10-CM | POA: Diagnosis not present

## 2017-12-22 DIAGNOSIS — G40909 Epilepsy, unspecified, not intractable, without status epilepticus: Secondary | ICD-10-CM | POA: Diagnosis not present

## 2017-12-22 DIAGNOSIS — K5900 Constipation, unspecified: Secondary | ICD-10-CM | POA: Diagnosis not present

## 2017-12-23 DIAGNOSIS — Z23 Encounter for immunization: Secondary | ICD-10-CM | POA: Diagnosis not present

## 2018-01-13 ENCOUNTER — Ambulatory Visit: Payer: Medicare Other | Admitting: Oncology

## 2018-01-14 ENCOUNTER — Inpatient Hospital Stay: Payer: Medicare Other | Attending: Oncology | Admitting: Adult Health

## 2018-01-14 ENCOUNTER — Telehealth: Payer: Self-pay | Admitting: Adult Health

## 2018-01-14 ENCOUNTER — Encounter: Payer: Self-pay | Admitting: Adult Health

## 2018-01-14 DIAGNOSIS — Z170 Estrogen receptor positive status [ER+]: Secondary | ICD-10-CM | POA: Diagnosis not present

## 2018-01-14 DIAGNOSIS — C50912 Malignant neoplasm of unspecified site of left female breast: Secondary | ICD-10-CM

## 2018-01-14 DIAGNOSIS — G40919 Epilepsy, unspecified, intractable, without status epilepticus: Secondary | ICD-10-CM | POA: Diagnosis not present

## 2018-01-14 DIAGNOSIS — F79 Unspecified intellectual disabilities: Secondary | ICD-10-CM | POA: Diagnosis not present

## 2018-01-14 DIAGNOSIS — C50312 Malignant neoplasm of lower-inner quadrant of left female breast: Secondary | ICD-10-CM | POA: Diagnosis not present

## 2018-01-14 DIAGNOSIS — Z79810 Long term (current) use of selective estrogen receptor modulators (SERMs): Secondary | ICD-10-CM | POA: Diagnosis not present

## 2018-01-14 DIAGNOSIS — E119 Type 2 diabetes mellitus without complications: Secondary | ICD-10-CM | POA: Diagnosis not present

## 2018-01-14 NOTE — Progress Notes (Signed)
CLINIC:  Survivorship   REASON FOR VISIT:  Routine follow-up for history of breast cancer.   BRIEF ONCOLOGIC HISTORY:    Breast cancer, left breast ([place])   03/04/2013 Surgery    (L) lumpectomy ([place]): IDC, grade 3, spanning 4 cm, LVI (+), margins negative. No lymph node sampling.   ER+ (91%), PR+ (12%), Ki67  19%. HER2 neg (ratio 1.43).          pT2, pNX    03/24/2013 Initial Diagnosis    Breast cancer, left breast ([place])    04/10/2013 -  Anti-estrogen oral therapy    Tamoxifen 20 mg daily.       INTERVAL HISTORY:  Paula Cantu presents to the Survivorship Clinic today for routine follow-up for her history of breast cancer.  She is accompanied by Paula Cantu, the group home manager for [facility] services.  Paula Cantu was recommended to be evaluated for her history of breast cancer.  She continues on Tamoxifen.  She shook her head no, that she is not having any issues with the Tamoxifen.  She has severe, profound IDD.  We were unable to get any vitals on her today due to her being volatile with the nursing assistant staff.  She is seen weekly by a physician, and gets physical exams if needed.  No breast changes have been noted by the staff who have assisted her with bathing.  There have been no problems for her lately at her group home.      REVIEW OF SYSTEMS:  Unable to obtain due to patient inability to verbalize secondary to cognitive deficits      PAST MEDICAL/SURGICAL HISTORY:  Past Medical History:  Diagnosis Date  . Arthritis   . Cancer (HCC)    endometrial  . Constipation   . Diabetes mellitus without complication ([place])    takes metformin  . Frequent falls   . Gait disorder   . Incontinence of bowel   . Incontinence of urine   . Intellectual disability    severe intellectual disability, non-verbal  . Osteopenia   . Seizures ([place])    remote history; possible febrile childhood seizure   Past Surgical History:  Procedure Laterality Date  . ABDOMINAL  HYSTERECTOMY    . BREAST LUMPECTOMY Left 03/04/2013   Procedure: LEFT BREAST LUMPECTOMY;  Surgeon: [name], MD;  Location: [place];  Service: General;  Laterality: Left;  . RADIOLOGY WITH ANESTHESIA N/A 09/16/2016   Procedure: RADIOLOGY WITH ANESTHESIA MRI OF THE BRAIN WITH AND WITHOUT;  Surgeon: Radiologist, Medication, MD;  Location: [facility];  Service: Radiology;  Laterality: N/A;  . RADIOLOGY WITH ANESTHESIA N/A 09/15/2017   Procedure: MRI WITH ANESTHESIA OF THE BRAIN WITH AND WITHOUT AND CERVICAL WITHOUT CONTRAST;  Surgeon: Radiologist, Medication, MD;  Location: [place];  Service: Radiology;  Laterality: N/A;     ALLERGIES:  Allergies  Allergen Reactions  . Lactose Intolerance (Gi) Other (See Comments)    On nursing home records  . Bacillus Other (See Comments)    UNSPECIFIED REACTION   . Ppd [Tuberculin Purified Protein Derivative] Other (See Comments)    UNSPECIFIED REACTION  On nursing home records     CURRENT MEDICATIONS:  Outpatient Encounter Medications as of 01/14/2018  Medication Sig  . Cholecalciferol (VITAMIN D) 2000 units tablet Take 2,000 Units by mouth daily.  . divalproex (DEPAKOTE SPRINKLES) 125 MG capsule Take 2 capsules (250 mg total) by mouth 2 (two) times daily.  Marland Kitchen loratadine (CLARITIN) 10 MG tablet Take  10 mg by mouth daily.  Marland Kitchen menthol-zinc oxide (GOLD BOND) powder Apply 1 application topically 2 (two) times daily. Apply under breasts  . metFORMIN (GLUCOPHAGE) 850 MG tablet Take 850 mg by mouth daily with breakfast.  . sennosides-docusate sodium (SENOKOT-S) 8.6-50 MG tablet Take 2 tablets by mouth at bedtime.  . tamoxifen (NOLVADEX) 20 MG tablet Take 20 mg by mouth daily.  . vitamin C (ASCORBIC ACID) 500 MG tablet Take 500 mg by mouth 2 (two) times daily.  . [DISCONTINUED] fenofibrate (TRICOR) 145 MG tablet Take 145 mg by mouth daily.  . [DISCONTINUED] magnesium hydroxide (MILK OF MAGNESIA) 400 MG/5ML suspension Take 15 mLs by mouth every other day.   No  facility-administered encounter medications on file as of 01/14/2018.      ONCOLOGIC FAMILY HISTORY:  History reviewed. No pertinent family history.    SOCIAL HISTORY:  Social History   Socioeconomic History  . Marital status: Single    Spouse name: Not on file  . Number of children: 0  . Years of education: Not on file  . Highest education level: Not on file  Occupational History  . Occupation: Disabled  Social Needs  . Financial resource strain: Not on file  . Food insecurity:    Worry: Not on file    Inability: Not on file  . Transportation needs:    Medical: Not on file    Non-medical: Not on file  Tobacco Use  . Smoking status: Never Smoker  . Smokeless tobacco: Never Used  Substance and Sexual Activity  . Alcohol use: No  . Drug use: No  . Sexual activity: Not on file  Lifestyle  . Physical activity:    Days per week: Not on file    Minutes per session: Not on file  . Stress: Not on file  Relationships  . Social connections:    Talks on phone: Not on file    Gets together: Not on file    Attends religious service: Not on file    Active member of club or organization: Not on file    Attends meetings of clubs or organizations: Not on file    Relationship status: Not on file  . Intimate partner violence:    Fear of current or ex partner: Not on file    Emotionally abused: Not on file    Physically abused: Not on file    Forced sexual activity: Not on file  Other Topics Concern  . Not on file  Social History Narrative   Lives at [facility].   She is ambidextrous.   Occasional caffeine use.       PHYSICAL EXAMINATION:  Vital Signs: unable to obtain vital signs due to aggression with nursing staff General: Well-nourished, well appearing female, limited exam due to cognitive deficit HEENT: Head is normocephalic.  Pupils equal and reactive to light. Conjunctivae clear without exudate.  Sclerae anicteric.  Cardiovascular: Regular rate and rhythm. Respiratory:  Clear to auscultation bilaterally. Chest expansion symmetric; breathing non-labored. (limited due to cognitive deficit) Breast Exam:  -unable to examine breasts due to patient cognitive level.   GI: Abdomen soft and round; non-tender, non-distended. Bowel sounds normoactive.  GU: Deferred.  Neuro: No focal deficits. Steady gait.  Psych: non verbal, aggressive with nursing assistants Extremities: 1+ bilateral lower extremity edema Skin: Warm and dry.  LABORATORY DATA:  None for this visit   DIAGNOSTIC IMAGING:  Most recent mammogram: unable to tolerate    ASSESSMENT AND PLAN:  Paula Cantu is  a 72 y.o. female with history of left sided breast cancer, ER+/PR+/HER2-, diagnosed in 02/2013, treated with lumpectomy and antiestrogen therapy with Tamoxifen beginning in 03/2013.  She presents to the Survivorship Clinic for surveillance and routine follow-up.   1. History of breast cancer:  Paula Cantu is currently clinically and radiographically without evidence of disease or recurrence of breast cancer.  She is unable to tolerate mammography, and cannot undergo clinical breast exam due to aggression with staff.  I reviewed with the group home manager, that if she would please let her nursing staff be aware of her h/o breast cancer and let us know if she has any breast changes.  Also, I requested that they notify us for any unilateral edema, vaginal bleeding, or any other concerns.  I gave her a handout of information regarding Tamoxifen.  Paula Cantu will return in 6 months for f/u with Dr. Jana Hakim    Dispo:  -Return to cancer center in 6 months for f/u with Dr. Jana Hakim -Plan reviewed with Dr. Jana Hakim in its entirety, he is in agreement.   A total of (30) minutes of face-to-face time was spent with this patient with greater than 50% of that time in counseling and care-coordination.   Gardenia Phlegm, NP Survivorship Program [facility] [phone]   Note: PRIMARY CARE  PROVIDER Trinidad Curet [phone]

## 2018-01-14 NOTE — Telephone Encounter (Signed)
Gave avs and calendar ° °

## 2018-01-14 NOTE — Patient Instructions (Signed)
Tamoxifen oral tablet What is this medicine? TAMOXIFEN (ta MOX i fen) blocks the effects of estrogen. It is commonly used to treat breast cancer. It is also used to decrease the chance of breast cancer coming back in women who have received treatment for the disease. It may also help prevent breast cancer in women who have a high risk of developing breast cancer. This medicine may be used for other purposes; ask your health care provider or pharmacist if you have questions. COMMON BRAND NAME(S): Nolvadex What should I tell my health care provider before I take this medicine? They need to know if you have any of these conditions: -blood clots -blood disease -cataracts or impaired eyesight -endometriosis -high calcium levels -high cholesterol -irregular menstrual cycles -liver disease -stroke -uterine fibroids -an unusual reaction to tamoxifen, other medicines, foods, dyes, or preservatives -pregnant or trying to get pregnant -breast-feeding How should I use this medicine? Take this medicine by mouth with a glass of water. Follow the directions on the prescription label. You can take it with or without food. Take your medicine at regular intervals. Do not take your medicine more often than directed. Do not stop taking except on your doctor's advice. A special MedGuide will be given to you by the pharmacist with each prescription and refill. Be sure to read this information carefully each time. Talk to your pediatrician regarding the use of this medicine in children. While this drug may be prescribed for selected conditions, precautions do apply. Overdosage: If you think you have taken too much of this medicine contact a poison control center or emergency room at once. NOTE: This medicine is only for you. Do not share this medicine with others. What if I miss a dose? If you miss a dose, take it as soon as you can. If it is almost time for your next dose, take only that dose. Do not take  double or extra doses. What may interact with this medicine? Do not take this medicine with any of the following medications: -cisapride -certain medicines for irregular heart beat like dofetilide, dronedarone, quinidine -certain medicines for fungal infection like fluconazole, posaconazole -pimozide -saquinavir -thioridazine This medicine may also interact with the following medications: -aminoglutethimide -anastrozole -bromocriptine -chemotherapy drugs -female hormones, like estrogens and birth control pills -letrozole -medroxyprogesterone -phenobarbital -rifampin -warfarin This list may not describe all possible interactions. Give your health care provider a list of all the medicines, herbs, non-prescription drugs, or dietary supplements you use. Also tell them if you smoke, drink alcohol, or use illegal drugs. Some items may interact with your medicine. What should I watch for while using this medicine? Visit your doctor or health care professional for regular checks on your progress. You will need regular pelvic exams, breast exams, and mammograms. If you are taking this medicine to reduce your risk of getting breast cancer, you should know that this medicine does not prevent all types of breast cancer. If breast cancer or other problems occur, there is no guarantee that it will be found at an early stage. Do not become pregnant while taking this medicine or for 2 months after stopping this medicine. Stop taking this medicine if you get pregnant or think you are pregnant and contact your doctor. This medicine may harm your unborn baby. Women who can possibly become pregnant should use birth control methods that do not use hormones during tamoxifen treatment and for 2 months after therapy has stopped. Talk with your health care provider for birth control advice.  Do not breast feed while taking this medicine. What side effects may I notice from receiving this medicine? Side effects that  you should report to your doctor or health care professional as soon as possible: -allergic reactions like skin rash, itching or hives, swelling of the face, lips, or tongue -changes in vision -changes in your menstrual cycle -difficulty walking or talking -new breast lumps -numbness -pelvic pain or pressure -redness, blistering, peeling or loosening of the skin, including inside the mouth -signs and symptoms of a dangerous change in heartbeat or heart rhythm like chest pain, dizziness, fast or irregular heartbeat, palpitations, feeling faint or lightheaded, falls, breathing problems -sudden chest pain -swelling, pain or tenderness in your calf or leg -unusual bruising or bleeding -vaginal discharge that is bloody, brown, or rust -weakness -yellowing of the whites of the eyes or skin Side effects that usually do not require medical attention (report to your doctor or health care professional if they continue or are bothersome): -fatigue -hair loss, although uncommon and is usually mild -headache -hot flashes -impotence (in men) -nausea, vomiting (mild) -vaginal discharge (white or clear) This list may not describe all possible side effects. Call your doctor for medical advice about side effects. You may report side effects to FDA at 1-800-FDA-1088. Where should I keep my medicine? Keep out of the reach of children. Store at room temperature between 20 and 25 degrees C (68 and 77 degrees F). Protect from light. Keep container tightly closed. Throw away any unused medicine after the expiration date. NOTE: This sheet is a summary. It may not cover all possible information. If you have questions about this medicine, talk to your doctor, pharmacist, or health care provider.  2018 Elsevier/Gold Standard (2015-09-21 07:27:41)

## 2018-01-21 DIAGNOSIS — F79 Unspecified intellectual disabilities: Secondary | ICD-10-CM | POA: Diagnosis not present

## 2018-01-21 DIAGNOSIS — Z6838 Body mass index (BMI) 38.0-38.9, adult: Secondary | ICD-10-CM | POA: Diagnosis not present

## 2018-01-21 DIAGNOSIS — C50312 Malignant neoplasm of lower-inner quadrant of left female breast: Secondary | ICD-10-CM | POA: Diagnosis not present

## 2018-01-21 DIAGNOSIS — E119 Type 2 diabetes mellitus without complications: Secondary | ICD-10-CM | POA: Diagnosis not present

## 2018-03-25 DIAGNOSIS — F79 Unspecified intellectual disabilities: Secondary | ICD-10-CM | POA: Diagnosis not present

## 2018-03-25 DIAGNOSIS — Z8542 Personal history of malignant neoplasm of other parts of uterus: Secondary | ICD-10-CM | POA: Diagnosis not present

## 2018-03-25 DIAGNOSIS — Z86010 Personal history of colonic polyps: Secondary | ICD-10-CM | POA: Diagnosis not present

## 2018-03-30 DIAGNOSIS — E119 Type 2 diabetes mellitus without complications: Secondary | ICD-10-CM | POA: Diagnosis not present

## 2018-03-30 DIAGNOSIS — D126 Benign neoplasm of colon, unspecified: Secondary | ICD-10-CM | POA: Diagnosis not present

## 2018-03-30 DIAGNOSIS — G40919 Epilepsy, unspecified, intractable, without status epilepticus: Secondary | ICD-10-CM | POA: Diagnosis not present

## 2018-04-06 DIAGNOSIS — Z6841 Body Mass Index (BMI) 40.0 and over, adult: Secondary | ICD-10-CM | POA: Diagnosis not present

## 2018-04-06 DIAGNOSIS — F79 Unspecified intellectual disabilities: Secondary | ICD-10-CM | POA: Diagnosis not present

## 2018-04-06 DIAGNOSIS — C50312 Malignant neoplasm of lower-inner quadrant of left female breast: Secondary | ICD-10-CM | POA: Diagnosis not present

## 2018-04-06 DIAGNOSIS — E119 Type 2 diabetes mellitus without complications: Secondary | ICD-10-CM | POA: Diagnosis not present

## 2018-04-20 DIAGNOSIS — G40919 Epilepsy, unspecified, intractable, without status epilepticus: Secondary | ICD-10-CM | POA: Diagnosis not present

## 2018-04-20 DIAGNOSIS — Z6841 Body Mass Index (BMI) 40.0 and over, adult: Secondary | ICD-10-CM | POA: Diagnosis not present

## 2018-04-20 DIAGNOSIS — J309 Allergic rhinitis, unspecified: Secondary | ICD-10-CM | POA: Diagnosis not present

## 2018-04-20 DIAGNOSIS — E119 Type 2 diabetes mellitus without complications: Secondary | ICD-10-CM | POA: Diagnosis not present

## 2018-04-23 DIAGNOSIS — E559 Vitamin D deficiency, unspecified: Secondary | ICD-10-CM | POA: Diagnosis not present

## 2018-04-23 DIAGNOSIS — D649 Anemia, unspecified: Secondary | ICD-10-CM | POA: Diagnosis not present

## 2018-04-23 DIAGNOSIS — I10 Essential (primary) hypertension: Secondary | ICD-10-CM | POA: Diagnosis not present

## 2018-04-23 DIAGNOSIS — E785 Hyperlipidemia, unspecified: Secondary | ICD-10-CM | POA: Diagnosis not present

## 2018-04-23 DIAGNOSIS — E039 Hypothyroidism, unspecified: Secondary | ICD-10-CM | POA: Diagnosis not present

## 2018-04-23 DIAGNOSIS — E119 Type 2 diabetes mellitus without complications: Secondary | ICD-10-CM | POA: Diagnosis not present

## 2018-04-23 DIAGNOSIS — Z79899 Other long term (current) drug therapy: Secondary | ICD-10-CM | POA: Diagnosis not present

## 2018-04-27 DIAGNOSIS — K5900 Constipation, unspecified: Secondary | ICD-10-CM | POA: Diagnosis not present

## 2018-04-27 DIAGNOSIS — E119 Type 2 diabetes mellitus without complications: Secondary | ICD-10-CM | POA: Diagnosis not present

## 2018-04-27 DIAGNOSIS — Z6841 Body Mass Index (BMI) 40.0 and over, adult: Secondary | ICD-10-CM | POA: Diagnosis not present

## 2018-04-27 DIAGNOSIS — F79 Unspecified intellectual disabilities: Secondary | ICD-10-CM | POA: Diagnosis not present

## 2018-04-27 DIAGNOSIS — E559 Vitamin D deficiency, unspecified: Secondary | ICD-10-CM | POA: Diagnosis not present

## 2018-04-27 DIAGNOSIS — D72829 Elevated white blood cell count, unspecified: Secondary | ICD-10-CM | POA: Diagnosis not present

## 2018-05-26 ENCOUNTER — Telehealth: Payer: Self-pay | Admitting: *Deleted

## 2018-05-26 ENCOUNTER — Ambulatory Visit: Payer: Medicare Other | Admitting: Neurology

## 2018-05-26 NOTE — Telephone Encounter (Signed)
No showed follow up appointment. 

## 2018-05-27 ENCOUNTER — Encounter: Payer: Self-pay | Admitting: Neurology

## 2018-06-22 DIAGNOSIS — G40919 Epilepsy, unspecified, intractable, without status epilepticus: Secondary | ICD-10-CM | POA: Diagnosis not present

## 2018-06-22 DIAGNOSIS — K5900 Constipation, unspecified: Secondary | ICD-10-CM | POA: Diagnosis not present

## 2018-06-22 DIAGNOSIS — F79 Unspecified intellectual disabilities: Secondary | ICD-10-CM | POA: Diagnosis not present

## 2018-06-22 DIAGNOSIS — Z6830 Body mass index (BMI) 30.0-30.9, adult: Secondary | ICD-10-CM | POA: Diagnosis not present

## 2018-06-22 DIAGNOSIS — R5383 Other fatigue: Secondary | ICD-10-CM | POA: Diagnosis not present

## 2018-06-22 DIAGNOSIS — J309 Allergic rhinitis, unspecified: Secondary | ICD-10-CM | POA: Diagnosis not present

## 2018-06-28 DIAGNOSIS — J309 Allergic rhinitis, unspecified: Secondary | ICD-10-CM | POA: Diagnosis not present

## 2018-06-28 DIAGNOSIS — Z6830 Body mass index (BMI) 30.0-30.9, adult: Secondary | ICD-10-CM | POA: Diagnosis not present

## 2018-06-28 DIAGNOSIS — K5900 Constipation, unspecified: Secondary | ICD-10-CM | POA: Diagnosis not present

## 2018-06-28 DIAGNOSIS — Z9010 Acquired absence of unspecified breast and nipple: Secondary | ICD-10-CM | POA: Diagnosis not present

## 2018-06-28 DIAGNOSIS — C50312 Malignant neoplasm of lower-inner quadrant of left female breast: Secondary | ICD-10-CM | POA: Diagnosis not present

## 2018-06-28 DIAGNOSIS — G40919 Epilepsy, unspecified, intractable, without status epilepticus: Secondary | ICD-10-CM | POA: Diagnosis not present

## 2018-07-15 ENCOUNTER — Telehealth: Payer: Self-pay | Admitting: Oncology

## 2018-07-15 NOTE — Telephone Encounter (Signed)
Per 4/30 schedule message moved 5/1 f/u to August. F/u was 5/7 not 5/1. Left message. Schedule mailed.

## 2018-07-22 ENCOUNTER — Ambulatory Visit: Payer: Medicare Other | Admitting: Oncology

## 2018-07-26 ENCOUNTER — Ambulatory Visit: Payer: Medicare Other | Admitting: Neurology

## 2018-08-30 ENCOUNTER — Ambulatory Visit: Payer: Medicare Other | Admitting: Neurology

## 2018-10-26 DIAGNOSIS — Z6839 Body mass index (BMI) 39.0-39.9, adult: Secondary | ICD-10-CM | POA: Diagnosis not present

## 2018-10-26 DIAGNOSIS — F72 Severe intellectual disabilities: Secondary | ICD-10-CM | POA: Diagnosis not present

## 2018-10-26 DIAGNOSIS — M8580 Other specified disorders of bone density and structure, unspecified site: Secondary | ICD-10-CM | POA: Diagnosis not present

## 2018-10-26 DIAGNOSIS — G40909 Epilepsy, unspecified, not intractable, without status epilepticus: Secondary | ICD-10-CM | POA: Diagnosis not present

## 2018-10-26 DIAGNOSIS — K5900 Constipation, unspecified: Secondary | ICD-10-CM | POA: Diagnosis not present

## 2018-10-26 DIAGNOSIS — E119 Type 2 diabetes mellitus without complications: Secondary | ICD-10-CM | POA: Diagnosis not present

## 2018-10-28 DIAGNOSIS — E119 Type 2 diabetes mellitus without complications: Secondary | ICD-10-CM | POA: Diagnosis not present

## 2018-10-28 DIAGNOSIS — C50312 Malignant neoplasm of lower-inner quadrant of left female breast: Secondary | ICD-10-CM | POA: Diagnosis not present

## 2018-10-28 DIAGNOSIS — G40919 Epilepsy, unspecified, intractable, without status epilepticus: Secondary | ICD-10-CM | POA: Diagnosis not present

## 2018-11-02 DIAGNOSIS — C50312 Malignant neoplasm of lower-inner quadrant of left female breast: Secondary | ICD-10-CM | POA: Diagnosis not present

## 2018-11-02 DIAGNOSIS — E119 Type 2 diabetes mellitus without complications: Secondary | ICD-10-CM | POA: Diagnosis not present

## 2018-11-02 DIAGNOSIS — F79 Unspecified intellectual disabilities: Secondary | ICD-10-CM | POA: Diagnosis not present

## 2018-11-08 NOTE — Progress Notes (Signed)
[place]  Telephone:(336) [phone] Fax:(336) [phone]   ID: Randol Kern OB: [birth year]  MR#: [id number]  [id number]  PCP: Trinidad Curet [phone]) GYN:   SU: Jackolyn Confer OTHER MD: Cephas Darby   CHIEF COMPLAINT: 73 Estrogen receptor positive breast cancer  CURRENT TREATMENT: Tamoxifen   BREAST CANCER HISTORY: From the original intake note:  Patient is nonverbal. According to nodes in the staff, a left breast mass was noted during a mass and this was brought to her primary physician's attention, who referred the patient to Dr. Lynwood Dawley. He saw the patient's on 01/07/2013 and noted a 5 cm mass in the superior aspect of the left breast, which seemed relatively fixed. There was no erythema. Very limited ultrasound was obtained and showed this to be solid. There was no evidence of supraclavicular adenopathy.  Needle biopsy was suggested but was not allowed by the patient. Mammography could not be obtained. Dr. [name] spoke with the patient's guardian, [name], who lives in [place], and discuss the situation with her. Approval was obtained for surgery and this was performed 03/04/2013. The final pathology ([facility] [id number]) showed an invasive ductal carcinoma, grade 3, measuring 4 cm, with negative margins (closest 2 mm) estrogen receptor positive at 91%, progesterone receptor positive at 12%, both with strong staining intensity, and a proliferation marker of 19%.  There is a question whether the patient may have received tamoxifen and/or Megace in the past, but I do not have any record regarding that.  The patient's subsequent history is as detailed below   INTERVAL HISTORY: Janette returns today for follow-up and treatment of her left-sided breast cancer. She was last seen here on 10/18/2014.  Today she is accompanied by her caregiver D3 and her social worker Estill Bamberg  She continues on tamoxifen.  The staff reports no obvious side effects from  this medication  Since her last visit here, she presented to the ED for excessive aggressive behavior on 08/31/2018. From Dr. Collier Bullock note: "Patient with an extensive work-up here.  Initial work-up to include head CT and chest x-ray and basic labs without acute findings.  Clinically thought that perhaps she would have a urinary tract infection.  There was delays in getting some of her urine.  Her urinalysis came back completely normal.  Patient did receive Ativan early on patient then settled down.  But she did have a persistent baseline tachycardia like 100-1 10.  So d-dimer was done on it was elevated at 1 CT angios done some artifact due to movement which is understandable and in this patient but no evidence of any large vessel of pulmonary embolus.  Patient stable for discharge back her caregiver is willing to take her back to the group home.  Patient may have just become more agitated by being in a medical environment.  Unable to find anything that required admission."  She underwent a head MRI with and without contrast and a cervical spine MRI without contrast on 09/16/2018 showing: No acute intracranial abnormality. Mild chronic small vessel ischemic disease, unchanged from 2018. Large chronic left mastoid and left middle ear effusions. Diffuse cervical disc degeneration, greatest at C5-6 where there is moderate spinal stenosis and cord flattening. No abnormal cord signal.   She does not undergo routine mammography as she is not able to tolerate the exam.    REVIEW OF SYSTEMS: [place] has difficulty with white coat so I did not wear my white coat today.  She was calm and  cooperative during the exam with the help of the staff.  The staff does note depressed when the patient bates, which they assist her with.  They have not noted any change.  They are not aware of any specific complaints that she has been having.  The patient is nonverbal and therefore a more detailed review of systems was not  available  PAST MEDICAL HISTORY: Past Medical History:  Diagnosis Date  . Arthritis   . Cancer (HCC)    endometrial  . Constipation   . Diabetes mellitus without complication ([place])    takes metformin  . Frequent falls   . Gait disorder   . Incontinence of bowel   . Incontinence of urine   . Intellectual disability    severe intellectual disability, non-verbal  . Osteopenia   . Seizures ([place])    remote history; possible febrile childhood seizure    PAST SURGICAL HISTORY: Past Surgical History:  Procedure Laterality Date  . ABDOMINAL HYSTERECTOMY    . BREAST LUMPECTOMY Left 03/04/2013   Procedure: LEFT BREAST LUMPECTOMY;  Surgeon: [name], MD;  Location: [facility];  Service: General;  Laterality: Left;  . RADIOLOGY WITH ANESTHESIA N/A 09/16/2016   Procedure: RADIOLOGY WITH ANESTHESIA MRI OF THE BRAIN WITH AND WITHOUT;  Surgeon: Radiologist, Medication, MD;  Location: [facility];  Service: Radiology;  Laterality: N/A;  . RADIOLOGY WITH ANESTHESIA N/A 09/15/2017   Procedure: MRI WITH ANESTHESIA OF THE BRAIN WITH AND WITHOUT AND CERVICAL WITHOUT CONTRAST;  Surgeon: Radiologist, Medication, MD;  Location: [facility];  Service: Radiology;  Laterality: N/A;    FAMILY HISTORY No family history on file.  GYNECOLOGIC HISTORY:  Status post hysterectomy  SOCIAL HISTORY:  The patient lives in a group home. Her legal caregiver, [name], lives in [place]. She can be reached at [phone]. She has agreed to oral  antiestrogen treatment for this patient.    ADVANCED DIRECTIVES: ?   HEALTH MAINTENANCE: Social History   Tobacco Use  . Smoking status: Never Smoker  . Smokeless tobacco: Never Used  Substance Use Topics  . Alcohol use: No  . Drug use: No       Allergies  Allergen Reactions  . Lactose Intolerance (Gi) Other (See Comments)    On nursing home records  . Bacillus Other (See Comments)    UNSPECIFIED REACTION   . Ppd [Tuberculin Purified Protein Derivative] Other  (See Comments)    UNSPECIFIED REACTION  On nursing home records    Current Outpatient Medications  Medication Sig Dispense Refill  . Cholecalciferol (VITAMIN D) 2000 units tablet Take 2,000 Units by mouth daily.    . divalproex (DEPAKOTE SPRINKLES) 125 MG capsule Take 2 capsules (250 mg total) by mouth 2 (two) times daily. 120 capsule 11  . loratadine (CLARITIN) 10 MG tablet Take 10 mg by mouth daily.    Marland Kitchen menthol-zinc oxide (GOLD BOND) powder Apply 1 application topically 2 (two) times daily. Apply under breasts    . metFORMIN (GLUCOPHAGE) 850 MG tablet Take 850 mg by mouth daily with breakfast.    . sennosides-docusate sodium (SENOKOT-S) 8.6-50 MG tablet Take 2 tablets by mouth at bedtime.    . tamoxifen (NOLVADEX) 20 MG tablet Take 20 mg by mouth daily.    . vitamin C (ASCORBIC ACID) 500 MG tablet Take 500 mg by mouth 2 (two) times daily.     No current facility-administered medications for this visit.     OBJECTIVE: Middle-aged white woman examined in a wheelchair There  were no vitals filed for this visit.   There is no height or weight on file to calculate BMI.    We were not able to obtain vitals on this patient because of cooperation issues  No cervical or supraclavicular adenopathy Lungs no rales or rhonchi Heart regular rate and rhythm Abd soft, nontender, positive bowel sounds MSK no upper extremity lymphedema Neuro: Nonverbal, cooperative Breasts: The right breast is unremarkable.  The left breast is status post lumpectomy.  There is no evidence of local recurrence.  Both axillae are benign.  Left breast 11/09/2018      Photo 10/18/2014    LAB RESULTS:  CMP     Component Value Date/Time   NA 141 08/30/2017 1607    I No results found for: SPEP  Lab Results  Component Value Date   WBC 13.3 (H) 08/30/2017      Chemistry      Component Value Date/Time   NA 141 08/30/2017 1607      Component Value Date/Time   CALCIUM 8.7 (L) 08/30/2017 1607        No results found for: LABCA2  No components found for: [id number]  No results for input(s): INR in the last 168 hours.  Urinalysis    Component Value Date/Time   COLORURINE YELLOW 08/30/2017 2121    STUDIES: Copies of outside reports were given to the staff  ASSESSMENT: 73 y.o. [facility] resident status post left lumpectomy with no sentinel lymph node sampling 03/04/2013 for a pT2 pNX, stage II invasive ductal carcinoma, grade 3, estrogen receptor 91% positive, progesterone receptor 12% positive, with an MIB-1 of 19%.  (1) tamoxifen started 04/13/2013   PLAN: Danial is now 5-1/2 years out from definitive surgery for her breast cancer with no evidence of disease recurrence.  This is very favorable.    She appears to be tolerating tamoxifen well and the plan will be to continue that for 10 years.  She is not able to tolerate mammography.  I have asked the staff to be alert to any changes in either breast but particularly the left.  She will be seen here in a yearly basis.  They know to call for any other issue that may be related to her breast cancer.     , Virgie Dad, MD  11/09/18 1:44 PM Medical Oncology and Hematology [facility] [address] [place], [place] [address] Tel. [phone]    Fax. [phone]  I, Jacqualyn Posey am acting as a Education administrator for Chauncey Cruel, MD.   I, Lurline Del MD, have reviewed the above documentation for accuracy and completeness, and I agree with the above.

## 2018-11-09 ENCOUNTER — Other Ambulatory Visit: Payer: Self-pay

## 2018-11-09 ENCOUNTER — Inpatient Hospital Stay: Payer: Medicare Other | Attending: Oncology | Admitting: Oncology

## 2018-11-09 DIAGNOSIS — C50812 Malignant neoplasm of overlapping sites of left female breast: Secondary | ICD-10-CM | POA: Diagnosis not present

## 2018-11-09 DIAGNOSIS — Z170 Estrogen receptor positive status [ER+]: Secondary | ICD-10-CM | POA: Diagnosis not present

## 2018-11-09 DIAGNOSIS — C50912 Malignant neoplasm of unspecified site of left female breast: Secondary | ICD-10-CM | POA: Diagnosis not present

## 2018-11-09 DIAGNOSIS — Z79810 Long term (current) use of selective estrogen receptor modulators (SERMs): Secondary | ICD-10-CM | POA: Diagnosis not present

## 2018-11-16 DIAGNOSIS — G40919 Epilepsy, unspecified, intractable, without status epilepticus: Secondary | ICD-10-CM | POA: Diagnosis not present

## 2018-11-16 DIAGNOSIS — Z9229 Personal history of other drug therapy: Secondary | ICD-10-CM | POA: Diagnosis not present

## 2018-11-16 DIAGNOSIS — E119 Type 2 diabetes mellitus without complications: Secondary | ICD-10-CM | POA: Diagnosis not present

## 2018-11-16 DIAGNOSIS — M50322 Other cervical disc degeneration at C5-C6 level: Secondary | ICD-10-CM | POA: Diagnosis not present

## 2018-11-16 DIAGNOSIS — C50312 Malignant neoplasm of lower-inner quadrant of left female breast: Secondary | ICD-10-CM | POA: Diagnosis not present

## 2018-11-16 DIAGNOSIS — M4800 Spinal stenosis, site unspecified: Secondary | ICD-10-CM | POA: Diagnosis not present

## 2019-01-20 DIAGNOSIS — Z23 Encounter for immunization: Secondary | ICD-10-CM | POA: Diagnosis not present

## 2019-01-25 DIAGNOSIS — E119 Type 2 diabetes mellitus without complications: Secondary | ICD-10-CM | POA: Diagnosis not present

## 2019-01-25 DIAGNOSIS — G40919 Epilepsy, unspecified, intractable, without status epilepticus: Secondary | ICD-10-CM | POA: Diagnosis not present

## 2019-01-25 DIAGNOSIS — C50312 Malignant neoplasm of lower-inner quadrant of left female breast: Secondary | ICD-10-CM | POA: Diagnosis not present

## 2019-02-01 DIAGNOSIS — Z6838 Body mass index (BMI) 38.0-38.9, adult: Secondary | ICD-10-CM | POA: Diagnosis not present

## 2019-02-01 DIAGNOSIS — R269 Unspecified abnormalities of gait and mobility: Secondary | ICD-10-CM | POA: Diagnosis not present

## 2019-02-01 DIAGNOSIS — E119 Type 2 diabetes mellitus without complications: Secondary | ICD-10-CM | POA: Diagnosis not present

## 2019-02-01 DIAGNOSIS — C50312 Malignant neoplasm of lower-inner quadrant of left female breast: Secondary | ICD-10-CM | POA: Diagnosis not present

## 2019-02-01 DIAGNOSIS — G40919 Epilepsy, unspecified, intractable, without status epilepticus: Secondary | ICD-10-CM | POA: Diagnosis not present

## 2019-02-15 DIAGNOSIS — Z03818 Encounter for observation for suspected exposure to other biological agents ruled out: Secondary | ICD-10-CM | POA: Diagnosis not present

## 2019-03-25 DIAGNOSIS — Z23 Encounter for immunization: Secondary | ICD-10-CM | POA: Diagnosis not present

## 2019-04-11 DIAGNOSIS — G40919 Epilepsy, unspecified, intractable, without status epilepticus: Secondary | ICD-10-CM | POA: Diagnosis not present

## 2019-04-11 DIAGNOSIS — Z6839 Body mass index (BMI) 39.0-39.9, adult: Secondary | ICD-10-CM | POA: Diagnosis not present

## 2019-04-11 DIAGNOSIS — E119 Type 2 diabetes mellitus without complications: Secondary | ICD-10-CM | POA: Diagnosis not present

## 2019-04-11 DIAGNOSIS — C50312 Malignant neoplasm of lower-inner quadrant of left female breast: Secondary | ICD-10-CM | POA: Diagnosis not present

## 2019-04-22 DIAGNOSIS — Z23 Encounter for immunization: Secondary | ICD-10-CM | POA: Diagnosis not present

## 2019-04-27 DIAGNOSIS — C50312 Malignant neoplasm of lower-inner quadrant of left female breast: Secondary | ICD-10-CM | POA: Diagnosis not present

## 2019-04-27 DIAGNOSIS — E119 Type 2 diabetes mellitus without complications: Secondary | ICD-10-CM | POA: Diagnosis not present

## 2019-04-27 DIAGNOSIS — G40919 Epilepsy, unspecified, intractable, without status epilepticus: Secondary | ICD-10-CM | POA: Diagnosis not present

## 2019-04-27 DIAGNOSIS — Z6838 Body mass index (BMI) 38.0-38.9, adult: Secondary | ICD-10-CM | POA: Diagnosis not present

## 2019-05-31 DIAGNOSIS — E119 Type 2 diabetes mellitus without complications: Secondary | ICD-10-CM | POA: Diagnosis not present

## 2019-05-31 DIAGNOSIS — G40919 Epilepsy, unspecified, intractable, without status epilepticus: Secondary | ICD-10-CM | POA: Diagnosis not present

## 2019-05-31 DIAGNOSIS — C50312 Malignant neoplasm of lower-inner quadrant of left female breast: Secondary | ICD-10-CM | POA: Diagnosis not present

## 2019-07-27 DIAGNOSIS — M4802 Spinal stenosis, cervical region: Secondary | ICD-10-CM | POA: Diagnosis not present

## 2019-07-27 DIAGNOSIS — Z9010 Acquired absence of unspecified breast and nipple: Secondary | ICD-10-CM | POA: Diagnosis not present

## 2019-07-27 DIAGNOSIS — Z9229 Personal history of other drug therapy: Secondary | ICD-10-CM | POA: Diagnosis not present

## 2019-07-27 DIAGNOSIS — Z6838 Body mass index (BMI) 38.0-38.9, adult: Secondary | ICD-10-CM | POA: Diagnosis not present

## 2019-07-27 DIAGNOSIS — C541 Malignant neoplasm of endometrium: Secondary | ICD-10-CM | POA: Diagnosis not present

## 2019-07-27 DIAGNOSIS — C50312 Malignant neoplasm of lower-inner quadrant of left female breast: Secondary | ICD-10-CM | POA: Diagnosis not present

## 2019-07-27 DIAGNOSIS — G40919 Epilepsy, unspecified, intractable, without status epilepticus: Secondary | ICD-10-CM | POA: Diagnosis not present

## 2019-08-03 DIAGNOSIS — G40919 Epilepsy, unspecified, intractable, without status epilepticus: Secondary | ICD-10-CM | POA: Diagnosis not present

## 2019-08-03 DIAGNOSIS — C50312 Malignant neoplasm of lower-inner quadrant of left female breast: Secondary | ICD-10-CM | POA: Diagnosis not present

## 2019-08-03 DIAGNOSIS — E119 Type 2 diabetes mellitus without complications: Secondary | ICD-10-CM | POA: Diagnosis not present

## 2019-08-03 DIAGNOSIS — Z6839 Body mass index (BMI) 39.0-39.9, adult: Secondary | ICD-10-CM | POA: Diagnosis not present

## 2019-10-18 DIAGNOSIS — E119 Type 2 diabetes mellitus without complications: Secondary | ICD-10-CM | POA: Diagnosis not present

## 2019-10-18 DIAGNOSIS — C50312 Malignant neoplasm of lower-inner quadrant of left female breast: Secondary | ICD-10-CM | POA: Diagnosis not present

## 2019-10-18 DIAGNOSIS — G40919 Epilepsy, unspecified, intractable, without status epilepticus: Secondary | ICD-10-CM | POA: Diagnosis not present

## 2019-10-18 DIAGNOSIS — Z6839 Body mass index (BMI) 39.0-39.9, adult: Secondary | ICD-10-CM | POA: Diagnosis not present

## 2019-11-01 DIAGNOSIS — Z20822 Contact with and (suspected) exposure to covid-19: Secondary | ICD-10-CM | POA: Diagnosis not present

## 2019-11-01 DIAGNOSIS — Z6839 Body mass index (BMI) 39.0-39.9, adult: Secondary | ICD-10-CM | POA: Diagnosis not present

## 2019-11-01 DIAGNOSIS — E119 Type 2 diabetes mellitus without complications: Secondary | ICD-10-CM | POA: Diagnosis not present

## 2019-11-01 DIAGNOSIS — F72 Severe intellectual disabilities: Secondary | ICD-10-CM | POA: Diagnosis not present

## 2019-11-01 DIAGNOSIS — G40909 Epilepsy, unspecified, not intractable, without status epilepticus: Secondary | ICD-10-CM | POA: Diagnosis not present

## 2019-11-07 DIAGNOSIS — E119 Type 2 diabetes mellitus without complications: Secondary | ICD-10-CM | POA: Diagnosis not present

## 2019-11-07 DIAGNOSIS — Z6838 Body mass index (BMI) 38.0-38.9, adult: Secondary | ICD-10-CM | POA: Diagnosis not present

## 2019-11-07 DIAGNOSIS — G40919 Epilepsy, unspecified, intractable, without status epilepticus: Secondary | ICD-10-CM | POA: Diagnosis not present

## 2019-11-07 DIAGNOSIS — C50312 Malignant neoplasm of lower-inner quadrant of left female breast: Secondary | ICD-10-CM | POA: Diagnosis not present

## 2019-11-08 ENCOUNTER — Other Ambulatory Visit: Payer: Self-pay | Admitting: *Deleted

## 2019-11-08 DIAGNOSIS — C50812 Malignant neoplasm of overlapping sites of left female breast: Secondary | ICD-10-CM

## 2019-11-09 ENCOUNTER — Inpatient Hospital Stay (HOSPITAL_BASED_OUTPATIENT_CLINIC_OR_DEPARTMENT_OTHER): Payer: Medicare Other | Admitting: Oncology

## 2019-11-09 ENCOUNTER — Encounter: Payer: Self-pay | Admitting: Oncology

## 2019-11-09 ENCOUNTER — Inpatient Hospital Stay: Payer: Medicare Other

## 2019-11-09 DIAGNOSIS — Z170 Estrogen receptor positive status [ER+]: Secondary | ICD-10-CM

## 2019-11-09 DIAGNOSIS — C50812 Malignant neoplasm of overlapping sites of left female breast: Secondary | ICD-10-CM

## 2019-11-09 DIAGNOSIS — F79 Unspecified intellectual disabilities: Secondary | ICD-10-CM

## 2019-11-09 NOTE — Progress Notes (Signed)
[place]  Telephone:(336) [phone] Fax:(336) [phone]   ID: Paula Cantu OB: 1945-11-02  MR#: [id number]  HEN#:[id number]  Patient Care Team: Trinidad Curet as PCP - General (Family Medicine) Milena Liggett, Virgie Dad, MD as Consulting Physician (Oncology) OTHER MD: Cephas Darby   CHIEF COMPLAINT: Estrogen receptor positive breast cancer  CURRENT TREATMENT: Tamoxifen   INTERVAL HISTORY: Paula Cantu was scheduled today for follow-up of her left-sided breast cancer. However she did not show   REVIEW OF SYSTEMS: [name]   BREAST CANCER HISTORY: From the original intake note:  Patient is nonverbal. According to nodes in the staff, a left breast mass was noted during a mass and this was brought to her primary physician's attention, who referred the patient to Dr. Lynwood Dawley. He saw the patient's on 01/07/2013 and noted a 5 cm mass in the superior aspect of the left breast, which seemed relatively fixed. There was no erythema. Very limited ultrasound was obtained and showed this to be solid. There was no evidence of supraclavicular adenopathy.  Needle biopsy was suggested but was not allowed by the patient. Mammography could not be obtained. Dr. [name] spoke with the patient's guardian, Paula Cantu, who lives in [place], and discuss the situation with her. Approval was obtained for surgery and this was performed 03/04/2013. The final pathology ([facility] [id number]) showed an invasive ductal carcinoma, grade 3, measuring 4 cm, with negative margins (closest 2 mm) estrogen receptor positive at 91%, progesterone receptor positive at 12%, both with strong staining intensity, and a proliferation marker of 19%.  There is a question whether the patient may have received tamoxifen and/or Megace in the past, but I do not have any record regarding that.  The patient's subsequent history is as detailed below   PAST MEDICAL HISTORY: Past Medical History:  Diagnosis Date  . Arthritis    . Cancer (HCC)    endometrial  . Constipation   . Diabetes mellitus without complication ([place])    takes metformin  . Frequent falls   . Gait disorder   . Incontinence of bowel   . Incontinence of urine   . Intellectual disability    severe intellectual disability, non-verbal  . Osteopenia   . Seizures ([place])    remote history; possible febrile childhood seizure    PAST SURGICAL HISTORY: Past Surgical History:  Procedure Laterality Date  . ABDOMINAL HYSTERECTOMY    . BREAST LUMPECTOMY Left 03/04/2013   Procedure: LEFT BREAST LUMPECTOMY;  Surgeon: [name], MD;  Location: [facility];  Service: General;  Laterality: Left;  . RADIOLOGY WITH ANESTHESIA N/A 09/16/2016   Procedure: RADIOLOGY WITH ANESTHESIA MRI OF THE BRAIN WITH AND WITHOUT;  Surgeon: Radiologist, Medication, MD;  Location: [facility];  Service: Radiology;  Laterality: N/A;  . RADIOLOGY WITH ANESTHESIA N/A 09/15/2017   Procedure: MRI WITH ANESTHESIA OF THE BRAIN WITH AND WITHOUT AND CERVICAL WITHOUT CONTRAST;  Surgeon: Radiologist, Medication, MD;  Location: [facility];  Service: Radiology;  Laterality: N/A;    FAMILY HISTORY No family history on file.   GYNECOLOGIC HISTORY:  Status post hysterectomy   SOCIAL HISTORY:  The patient lives in a group home. Her legal caregiver, Paula Cantu, lives in [place]. She can be reached at [phone]. She has agreed to oral  antiestrogen treatment for this patient.    ADVANCED DIRECTIVES: ?   HEALTH MAINTENANCE: Social History   Tobacco Use  . Smoking status: Never Smoker  . Smokeless tobacco: Never Used  Vaping Use  .  Vaping Use: Never used  Substance Use Topics  . Alcohol use: No  . Drug use: No       Allergies  Allergen Reactions  . Lactose Intolerance (Gi) Other (See Comments)    On nursing home records  . Bacillus Other (See Comments)    UNSPECIFIED REACTION   . Ppd [Tuberculin Purified Protein Derivative] Other (See Comments)    UNSPECIFIED REACTION  On  nursing home records    Current Outpatient Medications  Medication Sig Dispense Refill  . Cholecalciferol (VITAMIN D) 2000 units tablet Take 2,000 Units by mouth daily.    . divalproex (DEPAKOTE SPRINKLES) 125 MG capsule Take 2 capsules (250 mg total) by mouth 2 (two) times daily. 120 capsule 11  . menthol-zinc oxide (GOLD BOND) powder Apply 1 application topically 2 (two) times daily. Apply under breasts    . metFORMIN (GLUCOPHAGE) 850 MG tablet Take 850 mg by mouth daily with breakfast.    . sennosides-docusate sodium (SENOKOT-S) 8.6-50 MG tablet Take 2 tablets by mouth at bedtime.    . tamoxifen (NOLVADEX) 20 MG tablet Take 20 mg by mouth daily.    . vitamin C (ASCORBIC ACID) 500 MG tablet Take 500 mg by mouth 2 (two) times daily.     No current facility-administered medications for this visit.    OBJECTIVE: Middle-aged white woman  There were no vitals filed for this visit.   There is no height or weight on file to calculate BMI.       Left breast 11/09/2018      Photo 10/18/2014    LAB RESULTS:  CMP     Component Value Date/Time   NA 141 08/30/2017 1607    No results found for: SPEP  Lab Results  Component Value Date   WBC 13.3 (H) 08/30/2017      Chemistry      Component Value Date/Time   NA 141 08/30/2017 1607      Component Value Date/Time   CALCIUM 8.7 (L) 08/30/2017 1607       No results found for: LABCA2  No components found for: [id number]  No results for input(s): INR in the last 168 hours.  Urinalysis    Component Value Date/Time   COLORURINE YELLOW 08/30/2017 2121    STUDIES: No results found.    ASSESSMENT: 74 y.o. Paula Cantu [facility] resident status post left lumpectomy with no sentinel lymph node sampling 03/04/2013 for a pT2 pNX, stage II invasive ductal carcinoma, grade 3, estrogen receptor 91% positive, progesterone receptor 12% positive, with an MIB-1 of 19%.  (1) tamoxifen started 04/13/2013   PLAN: Paula Cantu missed her  appointment 11/09/2019.  At this point with a delta variant raging in the community we are going to reschedule her appointment to November.  Mera Gunkel, Virgie Dad, MD  11/09/19 4:56 PM Medical Oncology and Hematology [facility] [place], [place] [address] Tel. [phone]    Fax. [phone]   I, Wilburn Mylar, am acting as scribe for Dr. Virgie Dad. Aariyah Sampey.  I, Lurline Del MD, have reviewed the above documentation for accuracy and completeness, and I agree with the above.    *Total Encounter Time as defined by the Centers for Medicare and Medicaid Services includes, in addition to the face-to-face time of a patient visit (documented in the note above) non-face-to-face time: obtaining and reviewing outside history, ordering and reviewing medications, tests or procedures, care coordination (communications with other health care professionals or caregivers) and documentation in the medical  record.

## 2019-11-10 ENCOUNTER — Telehealth: Payer: Self-pay | Admitting: Oncology

## 2019-11-10 NOTE — Telephone Encounter (Signed)
Scheduling Message Entered by Sharlynn Oliphant A on 11/10/2019 at 8:49 AM Priority: High <No visit type provided>  Department: [facility]  Provider: Chauncey Cruel, MD  Appointment Notes:  Please call nurse, Sharen Hones @ [phone] or [phone] to reschedule missed appts.    Called second number per 8/26 sch msg - no answer. Left message for RN to call back  To reschedule appt.

## 2019-11-16 DIAGNOSIS — G40919 Epilepsy, unspecified, intractable, without status epilepticus: Secondary | ICD-10-CM | POA: Diagnosis not present

## 2019-11-16 DIAGNOSIS — C50312 Malignant neoplasm of lower-inner quadrant of left female breast: Secondary | ICD-10-CM | POA: Diagnosis not present

## 2019-11-16 DIAGNOSIS — E119 Type 2 diabetes mellitus without complications: Secondary | ICD-10-CM | POA: Diagnosis not present

## 2019-12-09 ENCOUNTER — Telehealth: Payer: Self-pay

## 2019-12-09 ENCOUNTER — Inpatient Hospital Stay: Payer: Medicare Other

## 2019-12-09 ENCOUNTER — Telehealth: Payer: Self-pay | Admitting: Adult Health

## 2019-12-09 ENCOUNTER — Inpatient Hospital Stay: Payer: Medicare Other | Admitting: Adult Health

## 2019-12-09 NOTE — Telephone Encounter (Signed)
Called facility to inform that pt has missed appt with [place] today. [place], RN at facility states someone named Paula Cantu was supposed to call us to cancel appt as pt's house she resides in is on quarantine for someone who is COVID positive, but states pt is not COVID positive. Message sent to scheduling to call and r/s pt's appt.

## 2019-12-09 NOTE — Telephone Encounter (Signed)
Scheduling Message Entered by Lennie Odor on 12/09/2019 at 11:57 AM Priority: Routine <No visit type provided>  Department: [facility]  Provider: Gardenia Phlegm, NP  Scheduling Notes:  Please call group home and speak with [place] (if you call today) or Tamika (or you call Monday) [phone] or [phone]. Pt missed appts today and needs to be r/s.    Called pt per schedule message. - left message with second number  With new appt date and time

## 2020-01-11 ENCOUNTER — Inpatient Hospital Stay: Payer: Medicare Other | Attending: Oncology

## 2020-01-11 ENCOUNTER — Inpatient Hospital Stay: Payer: Medicare Other | Admitting: Adult Health

## 2020-01-11 NOTE — Progress Notes (Deleted)
[name]  [phone] Fax:(336) [phone]   ID: Paula Cantu OB: 20-Apr-1945  MR#: [id number]  SWF#:[id number]  Patient Care Team: Trinidad Curet as PCP - General (Family Medicine) Magrinat, Virgie Dad, MD as Consulting Physician (Oncology) OTHER MD: Cephas Darby   CHIEF COMPLAINT: Estrogen receptor positive breast cancer  CURRENT TREATMENT: Tamoxifen   INTERVAL HISTORY: [name] is here today for f/u of her left sided estrogen positive breast cancer.    REVIEW OF SYSTEMS: [name]   BREAST CANCER HISTORY: From the original intake note:  Patient is nonverbal. According to nodes in the staff, a left breast mass was noted during a mass and this was brought to her primary physician's attention, who referred the patient to Dr. Lynwood Dawley. He saw the patient's on 01/07/2013 and noted a 5 cm mass in the superior aspect of the left breast, which seemed relatively fixed. There was no erythema. Very limited ultrasound was obtained and showed this to be solid. There was no evidence of supraclavicular adenopathy.  Needle biopsy was suggested but was not allowed by the patient. Mammography could not be obtained. Dr. [name] spoke with the patient's guardian, Paula Cantu, who lives in [place], and discuss the situation with her. Approval was obtained for surgery and this was performed 03/04/2013. The final pathology ([facility] [id number]) showed an invasive ductal carcinoma, grade 3, measuring 4 cm, with negative margins (closest 2 mm) estrogen receptor positive at 91%, progesterone receptor positive at 12%, both with strong staining intensity, and a proliferation marker of 19%.  There is a question whether the patient may have received tamoxifen and/or Megace in the past, but I do not have any record regarding that.  The patient's subsequent history is as detailed below   PAST MEDICAL HISTORY: Past Medical History:  Diagnosis Date  . Arthritis   . Cancer (HCC)     endometrial  . Constipation   . Diabetes mellitus without complication ([place])    takes metformin  . Frequent falls   . Gait disorder   . Incontinence of bowel   . Incontinence of urine   . Intellectual disability    severe intellectual disability, non-verbal  . Osteopenia   . Seizures ([place])    remote history; possible febrile childhood seizure    PAST SURGICAL HISTORY: Past Surgical History:  Procedure Laterality Date  . ABDOMINAL HYSTERECTOMY    . BREAST LUMPECTOMY Left 03/04/2013   Procedure: LEFT BREAST LUMPECTOMY;  Surgeon: [name], MD;  Location: [facility];  Service: General;  Laterality: Left;  . RADIOLOGY WITH ANESTHESIA N/A 09/16/2016   Procedure: RADIOLOGY WITH ANESTHESIA MRI OF THE BRAIN WITH AND WITHOUT;  Surgeon: Radiologist, Medication, MD;  Location: [facility];  Service: Radiology;  Laterality: N/A;  . RADIOLOGY WITH ANESTHESIA N/A 09/15/2017   Procedure: MRI WITH ANESTHESIA OF THE BRAIN WITH AND WITHOUT AND CERVICAL WITHOUT CONTRAST;  Surgeon: Radiologist, Medication, MD;  Location: [facility];  Service: Radiology;  Laterality: N/A;    FAMILY HISTORY No family history on file.   GYNECOLOGIC HISTORY:  Status post hysterectomy   SOCIAL HISTORY:  The patient lives in a group home. Her legal caregiver, Paula Cantu, lives in [place]. She can be reached at [phone]. She has agreed to oral  antiestrogen treatment for this patient.    ADVANCED DIRECTIVES: ?   HEALTH MAINTENANCE: Social History   Tobacco Use  . Smoking status: Never Smoker  . Smokeless tobacco: Never Used  Vaping Use  .  Vaping Use: Never used  Substance Use Topics  . Alcohol use: No  . Drug use: No       Allergies  Allergen Reactions  . Lactose Intolerance (Gi) Other (See Comments)    On nursing home records  . Bacillus Other (See Comments)    UNSPECIFIED REACTION   . Ppd [Tuberculin Purified Protein Derivative] Other (See Comments)    UNSPECIFIED REACTION  On nursing home  records    Current Outpatient Medications  Medication Sig Dispense Refill  . Cholecalciferol (VITAMIN D) 2000 units tablet Take 2,000 Units by mouth daily.    . divalproex (DEPAKOTE SPRINKLES) 125 MG capsule Take 2 capsules (250 mg total) by mouth 2 (two) times daily. 120 capsule 11  . menthol-zinc oxide (GOLD BOND) powder Apply 1 application topically 2 (two) times daily. Apply under breasts    . metFORMIN (GLUCOPHAGE) 850 MG tablet Take 850 mg by mouth daily with breakfast.    . sennosides-docusate sodium (SENOKOT-S) 8.6-50 MG tablet Take 2 tablets by mouth at bedtime.    . tamoxifen (NOLVADEX) 20 MG tablet Take 20 mg by mouth daily.    . vitamin C (ASCORBIC ACID) 500 MG tablet Take 500 mg by mouth 2 (two) times daily.     No current facility-administered medications for this visit.    OBJECTIVE: Middle-aged white woman  There were no vitals filed for this visit.   There is no height or weight on file to calculate BMI.    GENERAL: Patient is a well appearing female in no acute distress HEENT:  Sclerae anicteric.  Oropharynx clear and moist. No ulcerations or evidence of oropharyngeal candidiasis. Neck is supple.  NODES:  No cervical, supraclavicular, or axillary lymphadenopathy palpated.  BREAST EXAM:  Deferred. LUNGS:  Clear to auscultation bilaterally.  No wheezes or rhonchi. HEART:  Regular rate and rhythm. No murmur appreciated. ABDOMEN:  Soft, nontender.  Positive, normoactive bowel sounds. No organomegaly palpated. MSK:  No focal spinal tenderness to palpation. Full range of motion bilaterally in the upper extremities. EXTREMITIES:  No peripheral edema.   SKIN:  Clear with no obvious rashes or skin changes. No nail dyscrasia. NEURO:  Nonfocal. Well oriented.  Appropriate affect.    Left breast 11/09/2018      Photo 10/18/2014    LAB RESULTS:  CMP     Component Value Date/Time   NA 141 08/30/2017 1607    No results found for: SPEP  Lab Results  Component  Value Date   WBC 13.3 (H) 08/30/2017      Chemistry      Component Value Date/Time   NA 141 08/30/2017 1607      Component Value Date/Time   CALCIUM 8.7 (L) 08/30/2017 1607       No results found for: LABCA2  No components found for: UEAVW098  No results for input(s): INR in the last 168 hours.  Urinalysis    Component Value Date/Time   COLORURINE YELLOW 08/30/2017 2121    STUDIES: No results found.    ASSESSMENT: 74 y.o. [facility] resident status post left lumpectomy with no sentinel lymph node sampling 03/04/2013 for a pT2 pNX, stage II invasive ductal carcinoma, grade 3, estrogen receptor 91% positive, progesterone receptor 12% positive, with an MIB-1 of 19%.  (1) tamoxifen started 04/13/2013   PLAN: Olegario Shearer  Total encounter time: *** minutes  Wilber Bihari, NP 01/11/20 7:10 AM Medical Oncology and Hematology [facility] [place], [place] [address] Tel.  [phone]    Fax. [phone]   *Total Encounter Time as defined by the Centers for Medicare and Medicaid Services includes, in addition to the face-to-face time of a patient visit (documented in the note above) non-face-to-face time: obtaining and reviewing outside history, ordering and reviewing medications, tests or procedures, care coordination (communications with other health care professionals or caregivers) and documentation in the medical record.

## 2020-01-17 DIAGNOSIS — C50312 Malignant neoplasm of lower-inner quadrant of left female breast: Secondary | ICD-10-CM | POA: Diagnosis not present

## 2020-01-17 DIAGNOSIS — E119 Type 2 diabetes mellitus without complications: Secondary | ICD-10-CM | POA: Diagnosis not present

## 2020-01-17 DIAGNOSIS — Z23 Encounter for immunization: Secondary | ICD-10-CM | POA: Diagnosis not present

## 2020-01-17 DIAGNOSIS — G40919 Epilepsy, unspecified, intractable, without status epilepticus: Secondary | ICD-10-CM | POA: Diagnosis not present

## 2020-01-23 ENCOUNTER — Inpatient Hospital Stay: Payer: Medicare Other

## 2020-01-23 ENCOUNTER — Inpatient Hospital Stay: Payer: Medicare Other | Admitting: Adult Health

## 2020-01-31 DIAGNOSIS — E119 Type 2 diabetes mellitus without complications: Secondary | ICD-10-CM | POA: Diagnosis not present

## 2020-01-31 DIAGNOSIS — Z6838 Body mass index (BMI) 38.0-38.9, adult: Secondary | ICD-10-CM | POA: Diagnosis not present

## 2020-01-31 DIAGNOSIS — C50312 Malignant neoplasm of lower-inner quadrant of left female breast: Secondary | ICD-10-CM | POA: Diagnosis not present

## 2020-01-31 DIAGNOSIS — G40919 Epilepsy, unspecified, intractable, without status epilepticus: Secondary | ICD-10-CM | POA: Diagnosis not present

## 2020-03-06 DIAGNOSIS — Z79899 Other long term (current) drug therapy: Secondary | ICD-10-CM | POA: Diagnosis not present

## 2020-03-06 DIAGNOSIS — E119 Type 2 diabetes mellitus without complications: Secondary | ICD-10-CM | POA: Diagnosis not present

## 2020-03-06 DIAGNOSIS — E559 Vitamin D deficiency, unspecified: Secondary | ICD-10-CM | POA: Diagnosis not present

## 2020-03-06 DIAGNOSIS — F72 Severe intellectual disabilities: Secondary | ICD-10-CM | POA: Diagnosis not present

## 2020-03-06 DIAGNOSIS — Z6838 Body mass index (BMI) 38.0-38.9, adult: Secondary | ICD-10-CM | POA: Diagnosis not present

## 2020-03-13 ENCOUNTER — Emergency Department (HOSPITAL_COMMUNITY): Payer: Medicare Other

## 2020-03-13 ENCOUNTER — Inpatient Hospital Stay (HOSPITAL_COMMUNITY)
Admission: EM | Admit: 2020-03-13 | Discharge: 2020-03-19 | DRG: 177 | Disposition: A | Discharge status: Home Health | Payer: Medicare Other | Attending: Internal Medicine | Admitting: Internal Medicine

## 2020-03-13 ENCOUNTER — Encounter (HOSPITAL_COMMUNITY): Payer: Self-pay

## 2020-03-13 ENCOUNTER — Other Ambulatory Visit: Payer: Self-pay

## 2020-03-13 DIAGNOSIS — G9389 Other specified disorders of brain: Secondary | ICD-10-CM | POA: Diagnosis not present

## 2020-03-13 DIAGNOSIS — F72 Severe intellectual disabilities: Secondary | ICD-10-CM | POA: Diagnosis present

## 2020-03-13 DIAGNOSIS — Z853 Personal history of malignant neoplasm of breast: Secondary | ICD-10-CM

## 2020-03-13 DIAGNOSIS — Z79810 Long term (current) use of selective estrogen receptor modulators (SERMs): Secondary | ICD-10-CM

## 2020-03-13 DIAGNOSIS — W19XXXA Unspecified fall, initial encounter: Secondary | ICD-10-CM

## 2020-03-13 DIAGNOSIS — R404 Transient alteration of awareness: Secondary | ICD-10-CM | POA: Diagnosis not present

## 2020-03-13 DIAGNOSIS — G9340 Encephalopathy, unspecified: Secondary | ICD-10-CM | POA: Diagnosis present

## 2020-03-13 DIAGNOSIS — R4701 Aphasia: Secondary | ICD-10-CM | POA: Diagnosis not present

## 2020-03-13 DIAGNOSIS — Z79899 Other long term (current) drug therapy: Secondary | ICD-10-CM

## 2020-03-13 DIAGNOSIS — C50812 Malignant neoplasm of overlapping sites of left female breast: Secondary | ICD-10-CM

## 2020-03-13 DIAGNOSIS — J1282 Pneumonia due to coronavirus disease 2019: Secondary | ICD-10-CM | POA: Diagnosis present

## 2020-03-13 DIAGNOSIS — R4182 Altered mental status, unspecified: Secondary | ICD-10-CM | POA: Diagnosis not present

## 2020-03-13 DIAGNOSIS — R000 Tachycardia, unspecified: Secondary | ICD-10-CM | POA: Diagnosis present

## 2020-03-13 DIAGNOSIS — F79 Unspecified intellectual disabilities: Secondary | ICD-10-CM

## 2020-03-13 DIAGNOSIS — J9601 Acute respiratory failure with hypoxia: Secondary | ICD-10-CM | POA: Diagnosis present

## 2020-03-13 DIAGNOSIS — Z170 Estrogen receptor positive status [ER+]: Secondary | ICD-10-CM

## 2020-03-13 DIAGNOSIS — E119 Type 2 diabetes mellitus without complications: Secondary | ICD-10-CM | POA: Diagnosis present

## 2020-03-13 DIAGNOSIS — M8580 Other specified disorders of bone density and structure, unspecified site: Secondary | ICD-10-CM | POA: Diagnosis present

## 2020-03-13 DIAGNOSIS — Z7984 Long term (current) use of oral hypoglycemic drugs: Secondary | ICD-10-CM

## 2020-03-13 DIAGNOSIS — R0902 Hypoxemia: Secondary | ICD-10-CM | POA: Diagnosis not present

## 2020-03-13 DIAGNOSIS — H748X2 Other specified disorders of left middle ear and mastoid: Secondary | ICD-10-CM | POA: Diagnosis not present

## 2020-03-13 DIAGNOSIS — U071 COVID-19: Secondary | ICD-10-CM | POA: Diagnosis not present

## 2020-03-13 DIAGNOSIS — G9341 Metabolic encephalopathy: Secondary | ICD-10-CM | POA: Diagnosis present

## 2020-03-13 DIAGNOSIS — K6389 Other specified diseases of intestine: Secondary | ICD-10-CM | POA: Diagnosis not present

## 2020-03-13 DIAGNOSIS — Z8673 Personal history of transient ischemic attack (TIA), and cerebral infarction without residual deficits: Secondary | ICD-10-CM | POA: Diagnosis not present

## 2020-03-13 LAB — DIFFERENTIAL
Abs Immature Granulocytes: 0.11 10*3/uL — ABNORMAL HIGH (ref 0.00–0.07)
Basophils Absolute: 0.1 10*3/uL (ref 0.0–0.1)
Basophils Relative: 1 %
Eosinophils Absolute: 0 10*3/uL (ref 0.0–0.5)
Eosinophils Relative: 0 %
Immature Granulocytes: 1 %
Lymphocytes Relative: 6 %
Lymphs Abs: 0.8 10*3/uL (ref 0.7–4.0)
Monocytes Absolute: 0.9 10*3/uL (ref 0.1–1.0)
Monocytes Relative: 8 %
Neutro Abs: 10.5 10*3/uL — ABNORMAL HIGH (ref 1.7–7.7)
Neutrophils Relative %: 84 %

## 2020-03-13 LAB — COMPREHENSIVE METABOLIC PANEL
ALT: 15 U/L (ref 0–44)
AST: 24 U/L (ref 15–41)
Albumin: 3.4 g/dL — ABNORMAL LOW (ref 3.5–5.0)
Alkaline Phosphatase: 43 U/L (ref 38–126)
Anion gap: 10 (ref 5–15)
BUN: 10 mg/dL (ref 8–23)
CO2: 21 mmol/L — ABNORMAL LOW (ref 22–32)
Calcium: 8.5 mg/dL — ABNORMAL LOW (ref 8.9–10.3)
Chloride: 105 mmol/L (ref 98–111)
Creatinine, Ser: 0.68 mg/dL (ref 0.44–1.00)
GFR, Estimated: >60 mL/min (ref >60)
Glucose, Bld: 139 mg/dL — ABNORMAL HIGH (ref 70–99)
Potassium: 3.8 mmol/L (ref 3.5–5.1)
Sodium: 136 mmol/L (ref 135–145)
Total Bilirubin: 0.7 mg/dL (ref 0.3–1.2)
Total Protein: 6.4 g/dL — ABNORMAL LOW (ref 6.5–8.1)

## 2020-03-13 LAB — CBC
HCT: 40.6 % (ref 36.0–46.0)
Hemoglobin: 13.4 g/dL (ref 12.0–15.0)
MCH: 30.9 pg (ref 26.0–34.0)
MCHC: 33 g/dL (ref 30.0–36.0)
MCV: 93.5 fL (ref 80.0–100.0)
Platelets: 177 10*3/uL (ref 150–400)
RBC: 4.34 MIL/uL (ref 3.87–5.11)
RDW: 14.3 % (ref 11.5–15.5)
WBC: 12.4 10*3/uL — ABNORMAL HIGH (ref 4.0–10.5)
nRBC: 0 % (ref 0.0–0.2)

## 2020-03-13 LAB — LACTIC ACID, PLASMA: Lactic Acid, Venous: 1.4 mmol/L (ref 0.5–1.9)

## 2020-03-13 LAB — PROTIME-INR
INR: 1 (ref 0.8–1.2)
Prothrombin Time: 13.1 seconds (ref 11.4–15.2)

## 2020-03-13 LAB — LIPASE, BLOOD: Lipase: 29 U/L (ref 11–51)

## 2020-03-13 LAB — APTT: aPTT: 26 seconds (ref 24–36)

## 2020-03-13 LAB — TROPONIN I (HIGH SENSITIVITY): Troponin I (High Sensitivity): 4 ng/L (ref <18)

## 2020-03-13 MED ORDER — SODIUM CHLORIDE 0.9 % IV BOLUS
1000.0000 mL | Freq: Once | INTRAVENOUS | Status: AC
  Administered 2020-03-14: 1000 mL via INTRAVENOUS

## 2020-03-13 MED ORDER — ONDANSETRON HCL 4 MG/2ML IJ SOLN
4.0000 mg | Freq: Once | INTRAMUSCULAR | Status: AC
  Administered 2020-03-13: 4 mg via INTRAVENOUS
  Filled 2020-03-13: qty 2

## 2020-03-13 MED ORDER — MORPHINE SULFATE (PF) 4 MG/ML IV SOLN
4.0000 mg | Freq: Once | INTRAVENOUS | Status: AC
  Administered 2020-03-13: 4 mg via INTRAVENOUS
  Filled 2020-03-13: qty 1

## 2020-03-13 MED ORDER — SODIUM CHLORIDE 0.9% FLUSH
3.0000 mL | Freq: Once | INTRAVENOUS | Status: AC
Start: 2020-03-13 — End: 2020-03-14
  Administered 2020-03-14: 3 mL via INTRAVENOUS

## 2020-03-13 MED ORDER — HALOPERIDOL LACTATE 5 MG/ML IJ SOLN
2.0000 mg | Freq: Once | INTRAMUSCULAR | Status: AC
  Administered 2020-03-13: 2 mg via INTRAVENOUS
  Filled 2020-03-13: qty 1

## 2020-03-13 NOTE — ED Provider Notes (Signed)
[facility] EMERGENCY DEPARTMENT Provider Note   CSN: [id number] Arrival date & time: 03/13/20  1734     History Chief Complaint  Patient presents with  . Altered Mental Status    Paula Cantu is a 74 y.o. female.  74 yo F with a chief complaints of sudden onset change in her behavior.  The patient normally walks without assistance and suddenly seemed like she was unable to walk on her own.  Was leaning somewhat to the left and seemed in some distress.  Patient is mute, unable to communicate.  Was fine until that event occurred about 5:00.  The patient has a significant history of anxiety and usually does not tolerate medical care very well.  The history is provided by the patient.  Altered Mental Status Severity:  Moderate Most recent episode:  Yesterday Episode history:  Multiple Duration:  2 days Timing:  Constant Progression:  Worsening Chronicity:  New Associated symptoms: no fever, no headaches, no nausea, no palpitations and no vomiting        Past Medical History:  Diagnosis Date  . Arthritis   . Cancer (HCC)    endometrial  . Constipation   . Diabetes mellitus without complication ([place])    takes metformin  . Frequent falls   . Gait disorder   . Incontinence of bowel   . Incontinence of urine   . Intellectual disability    severe intellectual disability, non-verbal  . Osteopenia   . Seizures ([place])    remote history; possible febrile childhood seizure    Patient Active Problem List   Diagnosis Date Noted  . Malignant neoplasm of overlapping sites of left breast in female, estrogen receptor positive ([place]) 11/09/2018  . Mental disability 11/24/2017  . Falling 08/12/2017  . Idiopathic intellectual disability 04/13/2013    Past Surgical History:  Procedure Laterality Date  . ABDOMINAL HYSTERECTOMY    . BREAST LUMPECTOMY Left 03/04/2013   Procedure: LEFT BREAST LUMPECTOMY;  Surgeon: [name], MD;  Location: [facility];   Service: General;  Laterality: Left;  . RADIOLOGY WITH ANESTHESIA N/A 09/16/2016   Procedure: RADIOLOGY WITH ANESTHESIA MRI OF THE BRAIN WITH AND WITHOUT;  Surgeon: Radiologist, Medication, MD;  Location: [facility];  Service: Radiology;  Laterality: N/A;  . RADIOLOGY WITH ANESTHESIA N/A 09/15/2017   Procedure: MRI WITH ANESTHESIA OF THE BRAIN WITH AND WITHOUT AND CERVICAL WITHOUT CONTRAST;  Surgeon: Radiologist, Medication, MD;  Location: [facility];  Service: Radiology;  Laterality: N/A;     OB History   No obstetric history on file.     History reviewed. No pertinent family history.  Social History   Tobacco Use  . Smoking status: Never Smoker  . Smokeless tobacco: Never Used  Vaping Use  . Vaping Use: Never used  Substance Use Topics  . Alcohol use: No  . Drug use: No    Home Medications Prior to Admission medications   Medication Sig Start Date End Date Taking? Authorizing Provider  Cholecalciferol (VITAMIN D) 2000 units tablet Take 2,000 Units by mouth daily.    [provider]  divalproex (DEPAKOTE SPRINKLES) 125 MG capsule Take 2 capsules (250 mg total) by mouth 2 (two) times daily. 11/24/17   Marcial Pacas, MD  menthol-zinc oxide (GOLD BOND) powder Apply 1 application topically 2 (two) times daily. Apply under breasts    [provider]  metFORMIN (GLUCOPHAGE) 850 MG tablet Take 850 mg by mouth daily with breakfast.    [provider]  sennosides-docusate sodium (SENOKOT-S) 8.6-50 MG tablet Take 2 tablets by mouth at bedtime.    [provider]  tamoxifen (NOLVADEX) 20 MG tablet Take 20 mg by mouth daily.    [provider]  vitamin C (ASCORBIC ACID) 500 MG tablet Take 500 mg by mouth 2 (two) times daily.    [provider]    Allergies    Lactose intolerance (gi), Bacillus, and Ppd [tuberculin purified protein derivative]  Review of Systems   Review of Systems  Unable to perform ROS: Patient nonverbal  Constitutional: Negative  for chills and fever.  HENT: Negative for congestion and rhinorrhea.   Eyes: Negative for redness and visual disturbance.  Respiratory: Negative for shortness of breath and wheezing.   Cardiovascular: Negative for chest pain and palpitations.  Gastrointestinal: Negative for nausea and vomiting.  Genitourinary: Negative for dysuria and urgency.  Musculoskeletal: Negative for arthralgias and myalgias.  Skin: Negative for pallor and wound.  Neurological: Negative for dizziness and headaches.    Physical Exam Updated Vital Signs BP 108/74   Pulse (!) 140   Temp 98.3 F (36.8 C) (Oral)   Resp (!) 22   SpO2 91%   Physical Exam Vitals and nursing note reviewed.  Constitutional:      General: She is not in acute distress.    Appearance: She is well-developed and well-nourished. She is not diaphoretic.  HENT:     Head: Normocephalic and atraumatic.  Eyes:     Extraocular Movements: EOM normal.     Pupils: Pupils are equal, round, and reactive to light.  Cardiovascular:     Rate and Rhythm: Regular rhythm. Tachycardia present.     Heart sounds: No murmur heard. No friction rub. No gallop.   Pulmonary:     Effort: Pulmonary effort is normal.     Breath sounds: No wheezing or rales.  Abdominal:     General: There is no distension.     Palpations: Abdomen is soft.     Tenderness: There is no abdominal tenderness.  Musculoskeletal:        General: No tenderness or edema.     Cervical back: Normal range of motion and neck supple.  Skin:    General: Skin is warm and dry.  Neurological:     Mental Status: She is alert.  Psychiatric:        Mood and Affect: Mood and affect normal.     ED Results / Procedures / Treatments   Labs (all labs ordered are listed, but only abnormal results are displayed) Labs Reviewed  CBC - Abnormal; Notable for the following components:      Result Value   WBC 12.4 (*)    All other components within normal limits  DIFFERENTIAL - Abnormal;  Notable for the following components:   Neutro Abs 10.5 (*)    Abs Immature Granulocytes 0.11 (*)    All other components within normal limits  COMPREHENSIVE METABOLIC PANEL - Abnormal; Notable for the following components:   CO2 21 (*)    Glucose, Bld 139 (*)    Calcium 8.5 (*)    Total Protein 6.4 (*)    Albumin 3.4 (*)    All other components within normal limits  CULTURE, BLOOD (ROUTINE X 2)  CULTURE, BLOOD (ROUTINE X 2)  PROTIME-INR  APTT  LACTIC ACID, PLASMA  LIPASE, BLOOD  LACTIC ACID, PLASMA  URINALYSIS, ROUTINE W REFLEX MICROSCOPIC  I-STAT CHEM 8, ED  CBG MONITORING, ED  TROPONIN  I (HIGH SENSITIVITY)    EKG EKG Interpretation  Date/Time:  Tuesday March 13 2020 17:40:57 EST Ventricular Rate:  135 PR Interval:  122 QRS Duration: 72 QT Interval:  306 QTC Calculation: 459 R Axis:   7 Text Interpretation:  Poor data quality, interpretation may be adversely affected Sinus tachycardia Possible Inferior infarct , age undetermined Abnormal ECG No significant change since last tracing Confirmed by Melene Plan [id number]) on 03/13/2020 10:25:37 PM   Radiology No results found.  Procedures Procedures (including critical care time)  Medications Ordered in ED Medications  sodium chloride flush (NS) 0.9 % injection 3 mL (has no administration in time range)  sodium chloride 0.9 % bolus 1,000 mL (has no administration in time range)  haloperidol lactate (HALDOL) injection 2 mg (has no administration in time range)  morphine 4 MG/ML injection 4 mg (4 mg Intravenous Given 03/13/20 2249)  ondansetron (ZOFRAN) injection 4 mg (4 mg Intravenous Given 03/13/20 2251)  haloperidol lactate (HALDOL) injection 2 mg (2 mg Intravenous Given 03/13/20 2312)    ED Course  I have reviewed the triage vital signs and the nursing notes.  Pertinent labs & imaging results that were available during my care of the patient were reviewed by me and considered in my medical decision making (see  chart for details).    MDM Rules/Calculators/A&P                          74 yo F with a chief complaints of sudden onset leaning to the left side and acting differently than normal.  The patient unfortunately is nonverbal at baseline and is unable to provide any further information.  On exam the patient is significantly tachycardic though looking back at her chart the last time she was in the ED she was also significantly tachycardic.  Appears to be sinus tachycardia.  Will give a bolus of IV fluids pain and nausea medicine.  Laboratory evaluation.  No significant abdominal pain on exam.  No obvious areas of trauma.  Palpated from head to toe without any obvious painful areas.  As the patient was having some issue leading to one side will obtain a CT scan of the head.  Her old records were reviewed and it appears she has had some episodes where she fell suddenly.  She was evaluated by neurology for this few years ago.  Had an MRI that was negative.  Plan for CT scan of the chest abdomen pelvis is I am unable to localize the patient's discomfort to her symptoms with her baseline mental status.  Patient care signed out to Dr. Eudelia Bunch, please see his note for further details care in the ED.  The patients results and plan were reviewed and discussed.   Any x-rays performed were independently reviewed by myself.   Differential diagnosis were considered with the presenting HPI.  Medications  sodium chloride flush (NS) 0.9 % injection 3 mL (has no administration in time range)  sodium chloride 0.9 % bolus 1,000 mL (has no administration in time range)  haloperidol lactate (HALDOL) injection 2 mg (has no administration in time range)  morphine 4 MG/ML injection 4 mg (4 mg Intravenous Given 03/13/20 2249)  ondansetron (ZOFRAN) injection 4 mg (4 mg Intravenous Given 03/13/20 2251)  haloperidol lactate (HALDOL) injection 2 mg (2 mg Intravenous Given 03/13/20 2312)    Vitals:   03/13/20 2037 03/13/20  2221 03/13/20 2230 03/13/20 2315  BP: (!) 152/109  (!) 127/114  108/74  Pulse: (!) 152 (!) 137 (!) 130 (!) 140  Resp: (!) 24 16 (!) 25 (!) 22  Temp: 98.4 F (36.9 C) 98.3 F (36.8 C)    TempSrc: Axillary Oral    SpO2: 96% 97% 93% 91%    Final diagnoses:  Fall  Sinus tachycardia    Admission/ observation were discussed with the admitting physician, patient and/or family and they are comfortable with the plan.    Final Clinical Impression(s) / ED Diagnoses Final diagnoses:  Fall  Sinus tachycardia    Rx / DC Orders ED Discharge Orders    None       Deno Etienne, DO 03/14/20 0008

## 2020-03-13 NOTE — ED Triage Notes (Signed)
Caretaker from [facility] reports pt normally ambulates on her own with stand by assist. Pt has a hx of falls d/t her feet point outwards. She reports pt was leaning toward her Left side and while walking or standing she would start leaning to her Left. Caretaker reports she noticed these symptoms at 1545. Previous caretaker did not mention any of these symptoms, they called her at home and she denied noticing any of these symptoms during the day.   Unable to assess neuro status d/t hx of mental disability.  When this RN asked pt to smile or squeeze her hands pt closed her eyes and turned her head away. Caretaker at bedside reports this was her normal behavior

## 2020-03-13 NOTE — ED Triage Notes (Signed)
Pt BIB [facility] from a group home, [facility]. Pt normally is a/o x3 per staff, however pt is non verbal, staff reports she is not acting herself.   BP 136/76 HR 120 RR 18 93% RA CBG 113

## 2020-03-13 NOTE — ED Notes (Signed)
Unable to get vitals Pt is altered.

## 2020-03-14 ENCOUNTER — Encounter (HOSPITAL_COMMUNITY): Payer: Self-pay | Admitting: Family Medicine

## 2020-03-14 ENCOUNTER — Emergency Department (HOSPITAL_COMMUNITY): Payer: Medicare Other

## 2020-03-14 DIAGNOSIS — U071 COVID-19: Secondary | ICD-10-CM | POA: Diagnosis not present

## 2020-03-14 DIAGNOSIS — Z853 Personal history of malignant neoplasm of breast: Secondary | ICD-10-CM | POA: Diagnosis not present

## 2020-03-14 DIAGNOSIS — R4701 Aphasia: Secondary | ICD-10-CM | POA: Diagnosis not present

## 2020-03-14 DIAGNOSIS — E119 Type 2 diabetes mellitus without complications: Secondary | ICD-10-CM | POA: Diagnosis present

## 2020-03-14 DIAGNOSIS — C50812 Malignant neoplasm of overlapping sites of left female breast: Secondary | ICD-10-CM

## 2020-03-14 DIAGNOSIS — Z79810 Long term (current) use of selective estrogen receptor modulators (SERMs): Secondary | ICD-10-CM | POA: Diagnosis not present

## 2020-03-14 DIAGNOSIS — G9340 Encephalopathy, unspecified: Secondary | ICD-10-CM | POA: Diagnosis present

## 2020-03-14 DIAGNOSIS — J90 Pleural effusion, not elsewhere classified: Secondary | ICD-10-CM | POA: Diagnosis not present

## 2020-03-14 DIAGNOSIS — F72 Severe intellectual disabilities: Secondary | ICD-10-CM | POA: Diagnosis present

## 2020-03-14 DIAGNOSIS — F79 Unspecified intellectual disabilities: Secondary | ICD-10-CM | POA: Diagnosis not present

## 2020-03-14 DIAGNOSIS — M8580 Other specified disorders of bone density and structure, unspecified site: Secondary | ICD-10-CM | POA: Diagnosis present

## 2020-03-14 DIAGNOSIS — Z8673 Personal history of transient ischemic attack (TIA), and cerebral infarction without residual deficits: Secondary | ICD-10-CM | POA: Diagnosis not present

## 2020-03-14 DIAGNOSIS — J9601 Acute respiratory failure with hypoxia: Secondary | ICD-10-CM

## 2020-03-14 DIAGNOSIS — J1282 Pneumonia due to coronavirus disease 2019: Secondary | ICD-10-CM | POA: Diagnosis present

## 2020-03-14 DIAGNOSIS — Z170 Estrogen receptor positive status [ER+]: Secondary | ICD-10-CM

## 2020-03-14 DIAGNOSIS — R0602 Shortness of breath: Secondary | ICD-10-CM | POA: Diagnosis not present

## 2020-03-14 DIAGNOSIS — G9389 Other specified disorders of brain: Secondary | ICD-10-CM | POA: Diagnosis not present

## 2020-03-14 DIAGNOSIS — Z7984 Long term (current) use of oral hypoglycemic drugs: Secondary | ICD-10-CM | POA: Diagnosis not present

## 2020-03-14 DIAGNOSIS — Z79899 Other long term (current) drug therapy: Secondary | ICD-10-CM | POA: Diagnosis not present

## 2020-03-14 DIAGNOSIS — K6389 Other specified diseases of intestine: Secondary | ICD-10-CM | POA: Diagnosis not present

## 2020-03-14 DIAGNOSIS — R000 Tachycardia, unspecified: Secondary | ICD-10-CM | POA: Diagnosis present

## 2020-03-14 DIAGNOSIS — G9341 Metabolic encephalopathy: Secondary | ICD-10-CM | POA: Diagnosis present

## 2020-03-14 DIAGNOSIS — R4182 Altered mental status, unspecified: Secondary | ICD-10-CM | POA: Diagnosis not present

## 2020-03-14 DIAGNOSIS — H748X2 Other specified disorders of left middle ear and mastoid: Secondary | ICD-10-CM | POA: Diagnosis not present

## 2020-03-14 LAB — TSH: TSH: 1.184 u[IU]/mL (ref 0.350–4.500)

## 2020-03-14 LAB — HEMOGLOBIN A1C
Hgb A1c MFr Bld: 5.4 % (ref 4.8–5.6)
Mean Plasma Glucose: 108.28 mg/dL

## 2020-03-14 LAB — HIV ANTIBODY (ROUTINE TESTING W REFLEX): HIV Screen 4th Generation wRfx: NONREACTIVE

## 2020-03-14 LAB — URINALYSIS, ROUTINE W REFLEX MICROSCOPIC
Bilirubin Urine: NEGATIVE
Glucose, UA: NEGATIVE mg/dL
Hgb urine dipstick: NEGATIVE
Ketones, ur: 5 mg/dL — AB
Leukocytes,Ua: NEGATIVE
Nitrite: POSITIVE — AB
Protein, ur: NEGATIVE mg/dL
Specific Gravity, Urine: >1.046 — ABNORMAL HIGH (ref 1.005–1.030)
pH: 6 (ref 5.0–8.0)

## 2020-03-14 LAB — RESP PANEL BY RT-PCR (FLU A&B, COVID) ARPGX2
Influenza A by PCR: NEGATIVE
Influenza B by PCR: NEGATIVE
SARS Coronavirus 2 by RT PCR: POSITIVE — AB

## 2020-03-14 LAB — PROCALCITONIN: Procalcitonin: <0.1 ng/mL

## 2020-03-14 LAB — D-DIMER, QUANTITATIVE: D-Dimer, Quant: 1.45 ug/mL-FEU — ABNORMAL HIGH (ref 0.00–0.50)

## 2020-03-14 LAB — AMMONIA: Ammonia: 55 umol/L — ABNORMAL HIGH (ref 9–35)

## 2020-03-14 LAB — LACTATE DEHYDROGENASE: LDH: 200 U/L — ABNORMAL HIGH (ref 98–192)

## 2020-03-14 LAB — CBG MONITORING, ED
Glucose-Capillary: 100 mg/dL — ABNORMAL HIGH (ref 70–99)
Glucose-Capillary: 102 mg/dL — ABNORMAL HIGH (ref 70–99)
Glucose-Capillary: 114 mg/dL — ABNORMAL HIGH (ref 70–99)
Glucose-Capillary: 133 mg/dL — ABNORMAL HIGH (ref 70–99)

## 2020-03-14 LAB — SYPHILIS: RPR W/REFLEX TO RPR TITER AND TREPONEMAL ANTIBODIES, TRADITIONAL SCREENING AND DIAGNOSIS ALGORITHM: RPR Ser Ql: NONREACTIVE

## 2020-03-14 LAB — FERRITIN: Ferritin: 110 ng/mL (ref 11–307)

## 2020-03-14 LAB — FIBRINOGEN: Fibrinogen: 306 mg/dL (ref 210–475)

## 2020-03-14 LAB — C-REACTIVE PROTEIN: CRP: 0.9 mg/dL (ref <1.0)

## 2020-03-14 LAB — VITAMIN B12: Vitamin B-12: 187 pg/mL (ref 180–914)

## 2020-03-14 LAB — TRIGLYCERIDES: Triglycerides: 40 mg/dL (ref <150)

## 2020-03-14 MED ORDER — HALOPERIDOL LACTATE 5 MG/ML IJ SOLN
5.0000 mg | Freq: Once | INTRAMUSCULAR | Status: AC
  Administered 2020-03-14: 5 mg via INTRAMUSCULAR
  Filled 2020-03-14: qty 1

## 2020-03-14 MED ORDER — INSULIN ASPART 100 UNIT/ML ~~LOC~~ SOLN
0.0000 [IU] | Freq: Three times a day (TID) | SUBCUTANEOUS | Status: DC
  Administered 2020-03-16 – 2020-03-17 (×3): 1 [IU] via SUBCUTANEOUS
  Administered 2020-03-17: 3 [IU] via SUBCUTANEOUS
  Administered 2020-03-18 – 2020-03-19 (×2): 2 [IU] via SUBCUTANEOUS

## 2020-03-14 MED ORDER — HALOPERIDOL LACTATE 5 MG/ML IJ SOLN
1.0000 mg | Freq: Four times a day (QID) | INTRAMUSCULAR | Status: DC | PRN
  Administered 2020-03-15: 1 mg via INTRAVENOUS
  Filled 2020-03-14: qty 1

## 2020-03-14 MED ORDER — ENOXAPARIN SODIUM 40 MG/0.4ML ~~LOC~~ SOLN
40.0000 mg | Freq: Two times a day (BID) | SUBCUTANEOUS | Status: DC
  Administered 2020-03-15 – 2020-03-16 (×3): 40 mg via SUBCUTANEOUS
  Filled 2020-03-14 (×3): qty 0.4

## 2020-03-14 MED ORDER — DIAZEPAM 5 MG/ML IJ SOLN
2.5000 mg | Freq: Once | INTRAMUSCULAR | Status: AC
  Administered 2020-03-14: 2.5 mg via INTRAVENOUS
  Filled 2020-03-14: qty 2

## 2020-03-14 MED ORDER — SODIUM CHLORIDE 0.9 % IV SOLN: INTRAVENOUS | Status: DC

## 2020-03-14 MED ORDER — HYDROCODONE-ACETAMINOPHEN 5-325 MG PO TABS: 1.0000 | ORAL_TABLET | Freq: Four times a day (QID) | ORAL | Status: DC | PRN

## 2020-03-14 MED ORDER — DIVALPROEX SODIUM 125 MG PO CSDR
250.0000 mg | DELAYED_RELEASE_CAPSULE | Freq: Two times a day (BID) | ORAL | Status: DC
  Administered 2020-03-15 – 2020-03-19 (×9): 250 mg via ORAL
  Filled 2020-03-14 (×10): qty 2

## 2020-03-14 MED ORDER — SODIUM CHLORIDE 0.9% FLUSH
3.0000 mL | Freq: Two times a day (BID) | INTRAVENOUS | Status: DC
  Administered 2020-03-16 – 2020-03-19 (×6): 3 mL via INTRAVENOUS

## 2020-03-14 MED ORDER — GUAIFENESIN-DM 100-10 MG/5ML PO SYRP: 10.0000 mL | ORAL_SOLUTION | ORAL | Status: DC | PRN

## 2020-03-14 MED ORDER — ONDANSETRON HCL 4 MG/2ML IJ SOLN: 4.0000 mg | Freq: Four times a day (QID) | INTRAMUSCULAR | Status: DC | PRN

## 2020-03-14 MED ORDER — TAMOXIFEN CITRATE 10 MG PO TABS
20.0000 mg | ORAL_TABLET | Freq: Every day | ORAL | Status: DC
  Administered 2020-03-16 – 2020-03-19 (×4): 20 mg via ORAL
  Filled 2020-03-14 (×5): qty 2

## 2020-03-14 MED ORDER — SODIUM CHLORIDE 0.9 % IV SOLN
200.0000 mg | Freq: Once | INTRAVENOUS | Status: AC
  Administered 2020-03-14: 200 mg via INTRAVENOUS
  Filled 2020-03-14: qty 40

## 2020-03-14 MED ORDER — ENOXAPARIN SODIUM 40 MG/0.4ML ~~LOC~~ SOLN
40.0000 mg | SUBCUTANEOUS | Status: DC
  Administered 2020-03-14: 40 mg via SUBCUTANEOUS
  Filled 2020-03-14: qty 0.4

## 2020-03-14 MED ORDER — HALOPERIDOL LACTATE 5 MG/ML IJ SOLN
2.0000 mg | Freq: Four times a day (QID) | INTRAMUSCULAR | Status: DC | PRN
  Administered 2020-03-14: 2 mg via INTRAVENOUS
  Filled 2020-03-14: qty 1

## 2020-03-14 MED ORDER — LINAGLIPTIN 5 MG PO TABS
5.0000 mg | ORAL_TABLET | Freq: Every day | ORAL | Status: DC
  Administered 2020-03-16 – 2020-03-19 (×4): 5 mg via ORAL
  Filled 2020-03-14 (×4): qty 1

## 2020-03-14 MED ORDER — SODIUM CHLORIDE 0.9 % IV SOLN: 250.0000 mL | INTRAVENOUS | Status: DC | PRN

## 2020-03-14 MED ORDER — IOHEXOL 300 MG/ML  SOLN
100.0000 mL | Freq: Once | INTRAMUSCULAR | Status: AC | PRN
  Administered 2020-03-14: 100 mL via INTRAVENOUS

## 2020-03-14 MED ORDER — TAMOXIFEN CITRATE 20 MG PO TABS: 20.0000 mg | ORAL_TABLET | Freq: Every day | ORAL | Status: DC

## 2020-03-14 MED ORDER — HYDROCOD POLST-CPM POLST ER 10-8 MG/5ML PO SUER
5.0000 mL | Freq: Two times a day (BID) | ORAL | Status: DC | PRN
Start: 2020-03-14 — End: 2020-03-20

## 2020-03-14 MED ORDER — HALOPERIDOL LACTATE 5 MG/ML IJ SOLN
2.0000 mg | Freq: Once | INTRAMUSCULAR | Status: DC
  Filled 2020-03-14: qty 1

## 2020-03-14 MED ORDER — SODIUM CHLORIDE 0.9 % IV SOLN
100.0000 mg | Freq: Every day | INTRAVENOUS | Status: AC
  Administered 2020-03-15 – 2020-03-18 (×4): 100 mg via INTRAVENOUS
  Filled 2020-03-14 (×4): qty 20

## 2020-03-14 MED ORDER — SODIUM CHLORIDE 0.9% FLUSH: 3.0000 mL | INTRAVENOUS | Status: DC | PRN

## 2020-03-14 MED ORDER — SODIUM CHLORIDE 0.9% FLUSH
3.0000 mL | Freq: Two times a day (BID) | INTRAVENOUS | Status: DC
  Administered 2020-03-16 – 2020-03-19 (×7): 3 mL via INTRAVENOUS

## 2020-03-14 MED ORDER — DEXAMETHASONE SODIUM PHOSPHATE 10 MG/ML IJ SOLN
6.0000 mg | INTRAMUSCULAR | Status: DC
  Administered 2020-03-14 – 2020-03-17 (×4): 6 mg via INTRAVENOUS
  Filled 2020-03-14 (×5): qty 1

## 2020-03-14 MED ORDER — SENNOSIDES-DOCUSATE SODIUM 8.6-50 MG PO TABS
2.0000 | ORAL_TABLET | Freq: Every day | ORAL | Status: DC
  Administered 2020-03-15 – 2020-03-19 (×5): 2 via ORAL
  Filled 2020-03-14 (×5): qty 2

## 2020-03-14 MED ORDER — ONDANSETRON HCL 4 MG PO TABS: 4.0000 mg | ORAL_TABLET | Freq: Four times a day (QID) | ORAL | Status: DC | PRN

## 2020-03-14 MED ORDER — ACETAMINOPHEN 325 MG PO TABS
650.0000 mg | ORAL_TABLET | Freq: Four times a day (QID) | ORAL | Status: DC | PRN
  Filled 2020-03-14: qty 2

## 2020-03-14 NOTE — ED Notes (Signed)
The pt has pulled off her bp cuff  She was given haldol  To replace her  All her lines and monitoring equipment

## 2020-03-14 NOTE — ED Notes (Signed)
Pt became very agitated and repeatidly took O2 mask off and would not allow it to be put back on. Pt's O2 dropped into low 80's. Pt given Haldol for comfort with O2 mask. Pt unable to follow commands or take direction. Pt not able to be reoriented. Nursing judgement-PO meds held.

## 2020-03-14 NOTE — ED Notes (Signed)
Pt placed on 2.5L by Sophie, RN due to pts Oxygen saturation dropping to 85%

## 2020-03-14 NOTE — ED Notes (Signed)
The pts tgray still open but no food taken  The pt has pulled off her pulse ox and her 02 off she has pulled her iv and that had infiltrated

## 2020-03-14 NOTE — ED Notes (Signed)
hospitalist in room. Switched pt from pediatric NRB to nasal canula, pt continues to refuse to wear [place]. Provider is aware

## 2020-03-14 NOTE — ED Notes (Signed)
Lunch Tray Ordered @ 1038.  

## 2020-03-14 NOTE — ED Notes (Signed)
Order for sedation received from the admitting doctor

## 2020-03-14 NOTE — ED Notes (Signed)
Iv restarted nss iv

## 2020-03-14 NOTE — ED Notes (Signed)
Pt transported to CT ?

## 2020-03-14 NOTE — ED Notes (Signed)
I have place a non-rebrfeather on the pt and she has pulled it down but some of the 02 is getting rhtolugh sats are 97-100 pulse ox is now on her rt great toe until she gets it off

## 2020-03-14 NOTE — ED Notes (Signed)
Dinner Tray Ordered @ 1714. 

## 2020-03-14 NOTE — ED Notes (Signed)
Call placed to the admitting doctor  For suggestions

## 2020-03-14 NOTE — H&P (Signed)
History and Physical    Paula Cantu [id number] DOB: [birth year] DOA: 03/13/2020  PCP: Trinidad Curet   Patient coming from: Group home   Chief Complaint: Altered mental status   HPI: Paula Cantu is a 74 y.o. female with medical history significant for severe intellectual disability, type 2 diabetes mellitus, history of breast cancer, now presenting to emergency department with acute change in behavior.  Patient is reportedly nonverbal at baseline but able to ambulate with standby assist.  She was unable to walk on her own yesterday evening at her group home and appeared to be experiencing some discomfort.  Patient is unable to provide history due to her clinical condition.  ED Course: Upon arrival to the ED, patient is found to be afebrile, saturating in the low to mid 80s on room air, slightly tachypneic, tachycardic in the 130s, and with stable blood pressure.  EKG features sinus tachycardia with rate 135.  Noncontrast head CT is negative for acute intracranial abnormality.  CT of the chest, abdomen, and pelvis is notable for mildly prominent air-filled loops of small bowel which could reflect ileus.  Chemistry panel is unremarkable and CBC notable for leukocytosis to 12,400.  Lactic acid is reassuringly normal.  Troponin is normal.  Blood culture was collected in the ED and the patient was treated with Valium, morphine, Haldol, and IV fluids.  COVID-19 PCR is positive.  She was started on supplemental oxygen.  Review of Systems:  Unable to complete ROS secondary the patient's clinical condition.  Past Medical History:  Diagnosis Date  . Arthritis   . Cancer (HCC)    endometrial  . Constipation   . Diabetes mellitus without complication ([place])    takes metformin  . Frequent falls   . Gait disorder   . Incontinence of bowel   . Incontinence of urine   . Intellectual disability    severe intellectual disability, non-verbal  . Osteopenia   . Seizures ([place])    remote  history; possible febrile childhood seizure    Past Surgical History:  Procedure Laterality Date  . ABDOMINAL HYSTERECTOMY    . BREAST LUMPECTOMY Left 03/04/2013   Procedure: LEFT BREAST LUMPECTOMY;  Surgeon: [name], MD;  Location: [facility];  Service: General;  Laterality: Left;  . RADIOLOGY WITH ANESTHESIA N/A 09/16/2016   Procedure: RADIOLOGY WITH ANESTHESIA MRI OF THE BRAIN WITH AND WITHOUT;  Surgeon: Radiologist, Medication, MD;  Location: [facility];  Service: Radiology;  Laterality: N/A;  . RADIOLOGY WITH ANESTHESIA N/A 09/15/2017   Procedure: MRI WITH ANESTHESIA OF THE BRAIN WITH AND WITHOUT AND CERVICAL WITHOUT CONTRAST;  Surgeon: Radiologist, Medication, MD;  Location: [facility];  Service: Radiology;  Laterality: N/A;    Social History:   reports that she has never smoked. She has never used smokeless tobacco. She reports that she does not drink alcohol and does not use drugs.  Allergies  Allergen Reactions  . Lactose Intolerance (Gi) Other (See Comments)    On nursing home records  . Bacillus Other (See Comments)    UNSPECIFIED REACTION   . Ppd [Tuberculin Purified Protein Derivative] Other (See Comments)    UNSPECIFIED REACTION  On nursing home records    History reviewed. No pertinent family history.   Prior to Admission medications   Medication Sig Start Date End Date Taking? Authorizing Provider  Cholecalciferol (VITAMIN D) 2000 units tablet Take 2,000 Units by mouth daily.   Yes [provider]  divalproex (DEPAKOTE SPRINKLES) 125 MG capsule  Take 2 capsules (250 mg total) by mouth 2 (two) times daily. 11/24/17  Yes Marcial Pacas, MD  menthol-zinc oxide (GOLD BOND) powder Apply 1 application topically 2 (two) times daily. Apply under breasts   Yes [provider]  metFORMIN (GLUCOPHAGE) 850 MG tablet Take 850 mg by mouth daily with breakfast.   Yes [provider]  promethazine (PHENERGAN) 25 MG suppository Place 25 mg rectally See admin  instructions. Every 4-6 hours prn vomiting 2 or more times in 4 hours.   Yes [provider]  promethazine (PHENERGAN) 25 MG tablet Take 25 mg by mouth See admin instructions. Every 4-6 hours prn vomiting 2 or more times in 4 hours. Notify md if symptoms persist over 12 hours   Yes [provider]  sennosides-docusate sodium (SENOKOT-S) 8.6-50 MG tablet Take 2 tablets by mouth at bedtime.   Yes [provider]  tamoxifen (NOLVADEX) 20 MG tablet Take 20 mg by mouth daily.   Yes [provider]  vitamin C (ASCORBIC ACID) 500 MG tablet Take 500 mg by mouth 2 (two) times daily.   Yes [provider]    Physical Exam: Vitals:   03/14/20 0145 03/14/20 0256 03/14/20 0330 03/14/20 0345  BP: (!) 99/56 128/79 139/88 117/67  Pulse: (!) 118 (!) 131 (!) 138 (!) 126  Resp: 17 (!) 22 (!) 23 (!) 21  Temp:      TempSrc:      SpO2: 95% 93% 95% (!) 89%    Constitutional: NAD, calm  Eyes: PERTLA, lids and conjunctivae normal ENMT: Mucous membranes are moist. Posterior pharynx clear of any exudate or lesions.   Neck: normal, supple, no masses, no thyromegaly Respiratory:  Mild tachypnea, no wheezing. No pallor or cyanosis.   Cardiovascular: S1 & S2 heard, regular rate and rhythm. No extremity edema.   Abdomen: No distension, no tenderness, soft. Bowel sounds active.  Musculoskeletal: no clubbing / cyanosis. No joint deformity upper and lower extremities.   Skin: no significant rashes, lesions, ulcers. Warm, dry, well-perfused. Neurologic: CN 2-12 grossly intact. Sensation intact. Moving all extremities.  Psychiatric: Sleeping, easily woken. Non-verbal.     Labs and Imaging on Admission: I have personally reviewed following labs and imaging studies  CBC: Recent Labs  Lab 03/13/20 2214  WBC 12.4*  NEUTROABS 10.5*  HGB 13.4  HCT 40.6  MCV 93.5  PLT 123XX123   Basic Metabolic Panel: Recent Labs  Lab 03/13/20 2214  NA 136  K 3.8  CL 105  CO2 21*   GLUCOSE 139*  BUN 10  CREATININE 0.68  CALCIUM 8.5*   GFR: CrCl cannot be calculated (Unknown ideal weight.). Liver Function Tests: Recent Labs  Lab 03/13/20 2214  AST 24  ALT 15  ALKPHOS 43  BILITOT 0.7  PROT 6.4*  ALBUMIN 3.4*   Recent Labs  Lab 03/13/20 2227  LIPASE 29   No results for input(s): AMMONIA in the last 168 hours. Coagulation Profile: Recent Labs  Lab 03/13/20 2214  INR 1.0   Cardiac Enzymes: No results for input(s): CKTOTAL, CKMB, CKMBINDEX, TROPONINI in the last 168 hours. BNP (last 3 results) No results for input(s): PROBNP in the last 8760 hours. HbA1C: No results for input(s): HGBA1C in the last 72 hours. CBG: Recent Labs  Lab 03/14/20 0056  GLUCAP 114*   Lipid Profile: No results for input(s): CHOL, HDL, LDLCALC, TRIG, CHOLHDL, LDLDIRECT in the last 72 hours. Thyroid Function Tests: No results for input(s): TSH, T4TOTAL, FREET4, T3FREE, THYROIDAB in  the last 72 hours. Anemia Panel: No results for input(s): VITAMINB12, FOLATE, FERRITIN, TIBC, IRON, RETICCTPCT in the last 72 hours. Urine analysis:    Component Value Date/Time   COLORURINE YELLOW 08/30/2017 2121   APPEARANCEUR CLEAR 08/30/2017 2121   LABSPEC 1.006 08/30/2017 2121   PHURINE 6.0 08/30/2017 2121   GLUCOSEU NEGATIVE 08/30/2017 2121   HGBUR SMALL (A) 08/30/2017 2121   BILIRUBINUR NEGATIVE 08/30/2017 2121   [place] NEGATIVE 08/30/2017 2121   PROTEINUR NEGATIVE 08/30/2017 2121   NITRITE NEGATIVE 08/30/2017 2121   LEUKOCYTESUR NEGATIVE 08/30/2017 2121   Sepsis Labs: @LABRCNTIP (procalcitonin:4,lacticidven:4) ) Recent Results (from the past 240 hour(s))  Resp Panel by RT-PCR (Flu A&B, Covid) Nasopharyngeal Swab     Status: Abnormal   Collection Time: 03/14/20 12:59 AM   Specimen: Nasopharyngeal Swab; Nasopharyngeal(NP) swabs in vial transport medium  Result Value Ref Range Status   SARS Coronavirus 2 by RT PCR POSITIVE (A) NEGATIVE Final    Comment: RESULT CALLED TO,  READ BACK BY AND VERIFIED WITH: S WIRTZ RN 03/14/20 0305 JDW (NOTE) SARS-CoV-2 target nucleic acids are DETECTED.  The SARS-CoV-2 RNA is generally detectable in upper respiratory specimens during the acute phase of infection. Positive results are indicative of the presence of the identified virus, but do not rule out bacterial infection or co-infection with other pathogens not detected by the test. Clinical correlation with patient history and other diagnostic information is necessary to determine patient infection status. The expected result is Negative.  Fact Sheet for Patients: EntrepreneurPulse.com.au  Fact Sheet for Healthcare Providers: IncredibleEmployment.be  This test is not yet approved or cleared by the [place] FDA and  has been authorized for detection and/or diagnosis of SARS-CoV-2 by FDA under an Emergency Use Authorization (EUA).  This EUA will remain in effect (meaning this test can be used ) for the duration of  the COVID-19 declaration under Section 564(b)(1) of the Act, 21 U.S.C. section 360bbb-3(b)(1), unless the authorization is terminated or revoked sooner.     Influenza A by PCR NEGATIVE NEGATIVE Final   Influenza B by PCR NEGATIVE NEGATIVE Final    Comment: (NOTE) The Xpert Xpress SARS-CoV-2/FLU/RSV plus assay is intended as an aid in the diagnosis of influenza from Nasopharyngeal swab specimens and should not be used as a sole basis for treatment. Nasal washings and aspirates are unacceptable for Xpert Xpress SARS-CoV-2/FLU/RSV testing.  Fact Sheet for Patients: EntrepreneurPulse.com.au  Fact Sheet for Healthcare Providers: IncredibleEmployment.be  This test is not yet approved or cleared by the [place] FDA and has been authorized for detection and/or diagnosis of SARS-CoV-2 by FDA under an Emergency Use Authorization (EUA). This EUA will remain in effect (meaning  this test can be used) for the duration of the COVID-19 declaration under Section 564(b)(1) of the Act, 21 U.S.C. section 360bbb-3(b)(1), unless the authorization is terminated or revoked.  Performed at [facility], [place] [address], [place], [place] [address]      Radiological Exams on Admission: CT HEAD WO CONTRAST  Result Date: 03/14/2020 CLINICAL DATA:  Transient ischemic attack, aphasia EXAM: CT HEAD WITHOUT CONTRAST TECHNIQUE: Contiguous axial images were obtained from the base of the skull through the vertex without intravenous contrast. COMPARISON:  08/30/2017 FINDINGS: Brain: Normal anatomic configuration. Parenchymal volume loss is commensurate with the patient's age. Mild periventricular white matter changes are present likely reflecting the sequela of small vessel ischemia. No abnormal intra or extra-axial mass lesion or fluid collection. No abnormal mass effect or midline shift. No  evidence of acute intracranial hemorrhage or infarct. Ventricular size is normal. Cerebellum unremarkable. Vascular: No asymmetric hyperdense vasculature at the skull base. Skull: Intact Sinuses/Orbits: Paranasal sinuses are clear. Orbits are unremarkable. Other: There is dense opacification of the left mastoid air cells as well as fluid within the left middle ear cavity. There is sclerosis and effacement of the mastoid air cells suggesting chronic process. Together, the findings are suggestive changes of chronic otomastoiditis. No cortical erosion. IMPRESSION: No evidence of acute intracranial hemorrhage or infarct. Mild senescent changes. Changes suggesting chronic left otomastoiditis. Clinical correlation is suggested. Electronically Signed   By: Fidela Salisbury MD   On: 03/14/2020 00:12   CT CHEST ABDOMEN PELVIS W CONTRAST  Result Date: 03/14/2020 CLINICAL DATA:  Altered mental status EXAM: CT CHEST, ABDOMEN, AND PELVIS WITH CONTRAST TECHNIQUE: Multidetector CT imaging of the chest, abdomen and  pelvis was performed following the standard protocol during bolus administration of intravenous contrast. CONTRAST:  111mL OMNIPAQUE IOHEXOL 300 MG/ML  SOLN COMPARISON:  None. FINDINGS: Cardiovascular: There are no significant vascular findings. Mild scattered aortic atherosclerosis is seen. Coronary artery calcifications are noted. The heart size is normal. There is no pericardial thickening or effusion. Mediastinum/Nodes: There are no enlarged mediastinal, hilar or axillary lymph nodes. Small calcified lymph nodes seen within the right hila. The thyroid gland, trachea and esophagus demonstrate no significant findings. Lungs/Pleura: There are no areas consolidation. Mild streaky atelectasis seen at both lung bases. No suspicious pulmonary nodules. There is no pleural effusion. Upper abdomen: The visualized portion of the upper abdomen is unremarkable. Musculoskeletal/Chest wall: There is no chest wall mass or suspicious osseous finding. No acute osseous abnormality Abdomen/pelvis: Hepatobiliary: The liver is normal in density without focal abnormality.The main portal vein is patent. No evidence of calcified gallstones, gallbladder wall thickening or biliary dilatation. Pancreas: Unremarkable. No pancreatic ductal dilatation or surrounding inflammatory changes. Spleen: Normal in size without focal abnormality. Adrenals/Urinary Tract: Both adrenal glands appear normal. The kidneys and collecting system appear normal without evidence of urinary tract calculus or hydronephrosis. Bladder is unremarkable. Stomach/Bowel: The stomach, and small bowel are unremarkable. There is mildly prominent air-filled loops of colon seen throughout measuring up to 5 cm in transverse dimension. No areas of wall thickening or surrounding inflammatory changes however are noted. Vascular/Lymphatic: There are no enlarged mesenteric, retroperitoneal, or pelvic lymph nodes. Scattered aortic atherosclerotic calcifications are seen without  aneurysmal dilatation. Reproductive: The patient is status post hysterectomy. No adnexal masses or collections seen. Other: No evidence of abdominal wall mass or hernia. Musculoskeletal: No acute or significant osseous findings. IMPRESSION: No acute intrathoracic pathology to explain the patient's symptoms. Mildly prominent air-filled loops of colon seen throughout without definite evidence of bowel obstruction, which could be due to ileus Aortic Atherosclerosis (ICD10-I70.0). Electronically Signed   By: Prudencio Pair M.D.   On: 03/14/2020 00:39    EKG: Independently reviewed. Sinus tachycardia, rate 135.   Assessment/Plan   1. COVID-19; acute hypoxic respiratory failure  - Presents from group home with acute change in behavior and difficulty ambulating and is found to have COVID-19 with new 6 Lpm supplemental O2 requirement  - Continue supplemental O2, start steroids and remdesivir, check/trend procalcitonin, d-dimer, CRP, ferritin, and LDH   2. Acute encephalopathy  - Presents with acute change in behavior and difficulty walking  - Head CT is negative for acute findings and ED workup most notable for positive COVID-19 pcr and hypoxia   - Most likely secondary to COVID-19 infection  -  Check TSH, ammonia, RPR, B12, and continue supportive care, delirium precautions    3. Intellectual disability  - At baseline, patient ambulates with standby assist, is non-verbal, and often becomes agitated in doctors' offices per chart review and report of her caretakers - Continue supportive care    4. Type II DM  - Check CBGs and use linagliptin and SSI for now   5. History of breast cancer  - Continue tamoxifen    6. Sinus tachycardia  - HR as high as 150s in ED, appears to be sinus on EKG  - Likely due at least in part to anxiety/agitation and has improved to 110s by time of admission  - Check TSH and d-dimer    DVT prophylaxis: Lovenox  Code Status: Full  Family Communication: Discussed with  patient  Disposition Plan:  Patient is from: Group home  Anticipated d/c is to: TBD Anticipated d/c date is: 03/18/20 Patient currently: Has new supplemental O2 requirement and is acutely encephalopathic  Consults called: None  Admission status: Inpatient     Briscoe Deutscher, MD [facility]  03/14/2020, 4:13 AM

## 2020-03-14 NOTE — Progress Notes (Signed)
Patient seen and examined, admitted earlier this morning by Dr. Antionette Char, Ms. Hamidi is a 74 year old female with severe intellectual disability who resides in a group home, nonverbal at baseline, requires supervision and assistance with ADLs, history of type 2 diabetes mellitus, history of breast cancer was brought to the emergency room due to change in behavior, not acting like her usual self at the group home. -Upon arrival to the emergency room she was found to be hypoxic with O2 sats in the mid 80s on room air, slightly tachypneic, tachycardic with heart rate in the 130s. -CT head was unremarkable, CT chest abdomen pelvis noted mildly dilated loops of small bowel, her Covid PCR was positive, she was started on supplemental oxygen, remdesivir on Decadron, she would not leave her oxygen on and hence treated with Valium morphine and Haldol.  Acute hypoxic respiratory failure SARS Covid 19 viral infection -CT chest without pneumonia at this time -Was requiring 6 L of oxygen via nasal cannula however keeps pulling her oxygen off and will not leave this on -received both vaccines and booster last week -Repeat chest x-ray tomorrow -Monitor inflammatory markers  Encephalopathy -In the background of severe intellectual disability and nonverbal status -Most likely secondary to SARS COVID-19 infection, CT head unremarkable -Supportive care  Severe intellectual disability Nonverbal, resides in a group home, ambulates with assistance and requires assistance in ADLs -Reportedly gets agitated in doctors offices  Type 2 diabetes mellitus -Continue linagliptin, sliding scale insulin  DVT prophylaxis: Lovenox, increased dose to 40 mg every 12 CODE STATUS, full code, called and discussed with patient's niece and guardian Evangeline Dakin, strongly recommended consideration of DNR, she will think about this and get back to [place]  Zannie Cove, MD

## 2020-03-14 NOTE — Progress Notes (Signed)
Patient placed on 6L 30% pediatric venturi mask due to patient pulling off nasal cannula. Patient tolerating well at this time.

## 2020-03-14 NOTE — ED Notes (Signed)
Report received from off going nurse that the pt would not take anything by mouth not even food

## 2020-03-14 NOTE — ED Notes (Signed)
pts bed chaNGED all line and cords rerplaced  She still will not allow  A non-rebreather to be placed  Her pure wick is still in place

## 2020-03-14 NOTE — ED Provider Notes (Signed)
I assumed care of this patient.  Please see previous provider note for further details of Hx, PE.  Briefly patient is a 74 y.o. female who presented with change in behavior. She is nonverbal at baseline, living in group home. Initial labs with leukocytosis. No significant electrolyte derangements or renal sufficiency. Patient does have mild hyperglycemia without evidence of DKA. Currently pending CT head, chest, abdomen, pelvis and urine.  CT head without acute findings. On my read CT chest abdomen pelvis concerning for viral pneumonia. Otherwise no other acute process found. Covid added.  On my assessment of the patient, she was noted to be tachycardic which is how she has been since arrival. Patient was previously seen several years ago and noted to be tachycardic at that time.  Patient also noted to be hypoxic on room air requiring supplemental oxygen. Covid positive. UA still pending.   Will require admission for continued work-up and management.  Marland Kitchen1-3 Lead EKG Interpretation Performed by: Nira Conn, MD Authorized by: Nira Conn, MD     Interpretation: abnormal     ECG rate:  135   ECG rate assessment: tachycardic     Rhythm: sinus tachycardia     Ectopy: none     Conduction: normal   .Critical Care Performed by: Nira Conn, MD Authorized by: Nira Conn, MD   Critical care provider statement:    Critical care time (minutes):  45   Critical care was necessary to treat or prevent imminent or life-threatening deterioration of the following conditions:  Respiratory failure   Critical care was time spent personally by me on the following activities:  Discussions with consultants, evaluation of patient's response to treatment, examination of patient, ordering and performing treatments and interventions, ordering and review of laboratory studies, ordering and review of radiographic studies, pulse oximetry, re-evaluation of patient's  condition, obtaining history from patient or surrogate and review of old charts         Shakiera Edelson, Amadeo Garnet, MD 03/14/20 [phone]

## 2020-03-14 NOTE — ED Notes (Signed)
Pt continues to pull off her nasal canula. Will not tolerate mask either. Pt refused tylenol.

## 2020-03-15 ENCOUNTER — Inpatient Hospital Stay (HOSPITAL_COMMUNITY): Payer: Medicare Other

## 2020-03-15 DIAGNOSIS — U071 COVID-19: Secondary | ICD-10-CM | POA: Diagnosis present

## 2020-03-15 DIAGNOSIS — J1282 Pneumonia due to coronavirus disease 2019: Secondary | ICD-10-CM | POA: Diagnosis present

## 2020-03-15 DIAGNOSIS — J9601 Acute respiratory failure with hypoxia: Secondary | ICD-10-CM | POA: Diagnosis not present

## 2020-03-15 LAB — D-DIMER, QUANTITATIVE: D-Dimer, Quant: 2.58 ug/mL-FEU — ABNORMAL HIGH (ref 0.00–0.50)

## 2020-03-15 LAB — CBC WITH DIFFERENTIAL/PLATELET
Abs Immature Granulocytes: 0.05 10*3/uL (ref 0.00–0.07)
Basophils Absolute: 0 10*3/uL (ref 0.0–0.1)
Basophils Relative: 0 %
Eosinophils Absolute: 0 10*3/uL (ref 0.0–0.5)
Eosinophils Relative: 0 %
HCT: 36.4 % (ref 36.0–46.0)
Hemoglobin: 11.5 g/dL — ABNORMAL LOW (ref 12.0–15.0)
Immature Granulocytes: 1 %
Lymphocytes Relative: 16 %
Lymphs Abs: 1.5 10*3/uL (ref 0.7–4.0)
MCH: 30.3 pg (ref 26.0–34.0)
MCHC: 31.6 g/dL (ref 30.0–36.0)
MCV: 95.8 fL (ref 80.0–100.0)
Monocytes Absolute: 1.1 10*3/uL — ABNORMAL HIGH (ref 0.1–1.0)
Monocytes Relative: 12 %
Neutro Abs: 6.8 10*3/uL (ref 1.7–7.7)
Neutrophils Relative %: 71 %
Platelets: 139 10*3/uL — ABNORMAL LOW (ref 150–400)
RBC: 3.8 MIL/uL — ABNORMAL LOW (ref 3.87–5.11)
RDW: 14.2 % (ref 11.5–15.5)
WBC: 9.5 10*3/uL (ref 4.0–10.5)
nRBC: 0 % (ref 0.0–0.2)

## 2020-03-15 LAB — COMPREHENSIVE METABOLIC PANEL
ALT: 31 U/L (ref 0–44)
AST: 36 U/L (ref 15–41)
Albumin: 2.7 g/dL — ABNORMAL LOW (ref 3.5–5.0)
Alkaline Phosphatase: 29 U/L — ABNORMAL LOW (ref 38–126)
Anion gap: 11 (ref 5–15)
BUN: 8 mg/dL (ref 8–23)
CO2: 21 mmol/L — ABNORMAL LOW (ref 22–32)
Calcium: 7.6 mg/dL — ABNORMAL LOW (ref 8.9–10.3)
Chloride: 107 mmol/L (ref 98–111)
Creatinine, Ser: 0.67 mg/dL (ref 0.44–1.00)
GFR, Estimated: >60 mL/min (ref >60)
Glucose, Bld: 89 mg/dL (ref 70–99)
Potassium: 3.5 mmol/L (ref 3.5–5.1)
Sodium: 139 mmol/L (ref 135–145)
Total Bilirubin: 0.7 mg/dL (ref 0.3–1.2)
Total Protein: 4.9 g/dL — ABNORMAL LOW (ref 6.5–8.1)

## 2020-03-15 LAB — GLUCOSE, CAPILLARY
Glucose-Capillary: 118 mg/dL — ABNORMAL HIGH (ref 70–99)
Glucose-Capillary: 145 mg/dL — ABNORMAL HIGH (ref 70–99)

## 2020-03-15 LAB — CBG MONITORING, ED: Glucose-Capillary: 96 mg/dL (ref 70–99)

## 2020-03-15 LAB — FERRITIN: Ferritin: 160 ng/mL (ref 11–307)

## 2020-03-15 LAB — C-REACTIVE PROTEIN: CRP: 2.4 mg/dL — ABNORMAL HIGH (ref <1.0)

## 2020-03-15 MED ORDER — ACETAMINOPHEN 650 MG RE SUPP: 650.0000 mg | Freq: Four times a day (QID) | RECTAL | Status: DC | PRN

## 2020-03-15 MED ORDER — ACETAMINOPHEN 325 MG PO TABS: 650.0000 mg | ORAL_TABLET | Freq: Four times a day (QID) | ORAL | Status: DC | PRN

## 2020-03-15 NOTE — TOC Initial Note (Signed)
Transition of Care [facility]) - Initial/Assessment Note    Patient Details  Name: Paula Cantu MRN: [id number] Date of Birth: 1945/12/07  Transition of [facility]) CM/SW Contact:    Lockie Pares, RN Phone Number: 03/15/2020, 2:39 PM  Clinical Narrative:                 Patient from a group home, intellectual disability, "not acting right" COVID positive, hypoxia on 15L [place] and NRB.  Restraints and haldol, benzodiazepines to keep patient comfortable and mask on .  Niece is NOK, discussion started about code status. Niece is thinking about DNR.  CM will follow for needs, May need SNF post hospitalization , as well as oxygen therapy.   Expected Discharge Plan: Group Home Barriers to Discharge: Continued Medical Work up   Patient Goals and CMS Choice     Choice offered to / list presented to : [facility] POA / Guardian  Expected Discharge Plan and Services Expected Discharge Plan: Group Home   Discharge Planning Services: CM Consult   Living arrangements for the past 2 months: Group Home                                      Prior Living Arrangements/Services Living arrangements for the past 2 months: Group Home Lives with:: Facility Resident Patient language and need for interpreter reviewed:: Yes (patient is nonverbal)        Need for Family Participation in Patient Care: Yes (Comment) Care giver support system in place?: Yes (comment)   Criminal Activity/Legal Involvement Pertinent to Current Situation/Hospitalization: No - Comment as needed  Activities of Daily Living      Permission Sought/Granted      Share Information with NAME: Neice           Emotional Assessment       Orientation: : Fluctuating Orientation (Suspected and/or reported Sundowners) Alcohol / Substance Use: Not Applicable Psych Involvement: No (comment)  Admission diagnosis:  Sinus tachycardia [R00.0] Fall [W19.XXXA] Hypoxia [R09.02] Acute hypoxemic respiratory failure due to  COVID-19 (HCC) [U07.1, J96.01] Pneumonia due to COVID-19 virus [U07.1, J12.82] Patient Active Problem List   Diagnosis Date Noted  . Pneumonia due to COVID-19 virus 03/15/2020  . Acute hypoxemic respiratory failure due to COVID-19 (HCC) 03/14/2020  . Sinus tachycardia 03/14/2020  . Acute encephalopathy 03/14/2020  . Malignant neoplasm of overlapping sites of left breast in female, estrogen receptor positive (HCC) 11/09/2018  . Mental disability 11/24/2017  . Falling 08/12/2017  . Idiopathic intellectual disability 04/13/2013   PCP:  Lucretia Field Pharmacy:   [phone] - [place], [place] - [address] AT [facility] [address] [place] [address] Phone: [phone] Fax: [phone]     Social Determinants of Health (SDOH) Interventions    Readmission Risk Interventions No flowsheet data found.

## 2020-03-15 NOTE — Progress Notes (Addendum)
PROGRESS NOTE    Paula Cantu  SWF:[id number] DOB: [birth year] DOA: 03/13/2020 PCP: Trinidad Curet  Brief Narrative:Ms. Rings is a 74 year old female with severe intellectual disability who resides in a group home, nonverbal at baseline, requires supervision and assistance with ADLs, history of type 2 diabetes mellitus, history of breast cancer was brought to the emergency room due to change in behavior, not acting like her usual self at the group home. -Upon arrival to the emergency room she was found to be hypoxic with O2 sats in the mid 80s on room air, slightly tachypneic, tachycardic with heart rate in the 130s. -CT head was unremarkable, CT chest abdomen pelvis noted mildly dilated loops of small bowel, her Covid PCR was positive, she was started on supplemental oxygen, remdesivir on Decadron, she would not leave her oxygen on and hence treated with Valium morphine and Haldol.  Assessment & Plan:   Acute hypoxic respiratory failure SARS Covid 19 viral infection -CT chest on admission without infiltrates, repeat chest x-ray today with bilateral lower lobe infiltrates -Was requiring 6 L of oxygen via nasal cannula however keeps pulling her oxygen off and will not leave this on, currently on a nonrebreather mask, difficult to assess true oxygen requirement as she will not leave either nasal cannula or mask on for more than a minute or 2 -continue IV solumedrol and remdesevir -Monitor inflammatory markers -Haldol and restraints as needed for agitation -May likely need palliative care involvement, called niece again and strongly recommended consideration of DNR she would like to think about this some more  Encephalopathy -In the background of severe intellectual disability and nonverbal status -Most likely secondary to SARS COVID-19 infection, CT head unremarkable -Supportive care, continue IV fluids as she continues to decline all p.o. intake  Severe intellectual  disability Nonverbal, resides in a group home, ambulates with assistance and requires assistance in ADLs -Reportedly gets agitated in doctors offices  Type 2 diabetes mellitus -Continue linagliptin, sliding scale insulin  DNR discussion -Called patient's niece and POA [name] and strongly recommended consideration of DNR yesterday and today, she will think about this and get back to [place].  DVT prophylaxis: Lovenox Code Status: Full code Family Communication: No family at bedside, called and updated patient's niece [name] Disposition Plan:  Status is: Inpatient  Remains inpatient appropriate because:Inpatient level of care appropriate due to severity of illness   Dispo: The patient is from: Group home              Anticipated d/c is to: To be determined              Anticipated d/c date is: To be determined              Patient currently is not medically stable to d/c.  Consultants:     Procedures:   Antimicrobials:    Subjective: -Agitated, received Haldol many times, refuses all p.o. intake,  Objective: Vitals:   03/15/20 0715 03/15/20 0930 03/15/20 0945 03/15/20 1044  BP: 102/74 117/70 121/66   Pulse: (!) 118 (!) 108 (!) 110   Resp: (!) 35 (!) 23 (!) 23   Temp:    (!) 101.1 F (38.4 C)  TempSrc:    Rectal  SpO2: 93% 93% 91%   Weight:      Height:       No intake or output data in the 24 hours ending 03/15/20 1128 Filed Weights   03/14/20 2158  Weight: 68 kg  Examination:  General exam: Somnolent elderly female, resting after Haldol Respiratory system: Few basilar rhonchi Cardiovascular system: S1 & S2 heard, RRR Gastrointestinal system: Abdomen is nondistended, soft and nontender.Normal bowel sounds heard. Central nervous system: Unable to assess, Extremities: No edema Skin: No rashes on exposed skin Psychiatry: Unable to assess    Data Reviewed:   CBC: Recent Labs  Lab 03/13/20 2214 03/15/20 0626  WBC 12.4* 9.5  NEUTROABS  10.5* 6.8  HGB 13.4 11.5*  HCT 40.6 36.4  MCV 93.5 95.8  PLT 177 139*   Basic Metabolic Panel: Recent Labs  Lab 03/13/20 2214 03/15/20 0234  NA 136 139  K 3.8 3.5  CL 105 107  CO2 21* 21*  GLUCOSE 139* 89  BUN 10 8  CREATININE 0.68 0.67  CALCIUM 8.5* 7.6*   GFR: Estimated Creatinine Clearance: 54.4 mL/min (by C-G formula based on SCr of 0.67 mg/dL). Liver Function Tests: Recent Labs  Lab 03/13/20 2214 03/15/20 0234  AST 24 36  ALT 15 31  ALKPHOS 43 29*  BILITOT 0.7 0.7  PROT 6.4* 4.9*  ALBUMIN 3.4* 2.7*   Recent Labs  Lab 03/13/20 2227  LIPASE 29   Recent Labs  Lab 03/14/20 0413  AMMONIA 55*   Coagulation Profile: Recent Labs  Lab 03/13/20 2214  INR 1.0   Cardiac Enzymes: No results for input(s): CKTOTAL, CKMB, CKMBINDEX, TROPONINI in the last 168 hours. BNP (last 3 results) No results for input(s): PROBNP in the last 8760 hours. HbA1C: Recent Labs    03/14/20 0411  HGBA1C 5.4   CBG: Recent Labs  Lab 03/14/20 0056 03/14/20 0758 03/14/20 1228 03/14/20 1821 03/15/20 0739  GLUCAP 114* 102* 133* 100* 96   Lipid Profile: Recent Labs    03/14/20 0327  TRIG 40   Thyroid Function Tests: Recent Labs    03/14/20 0413  TSH 1.184   Anemia Panel: Recent Labs    03/14/20 0327 03/14/20 0413 03/15/20 0234  VITAMINB12  --  187  --   FERRITIN 110  --  160   Urine analysis:    Component Value Date/Time   COLORURINE YELLOW 03/14/2020 0838   APPEARANCEUR CLEAR 03/14/2020 0838   LABSPEC >1.046 (H) 03/14/2020 0838   PHURINE 6.0 03/14/2020 0838   GLUCOSEU NEGATIVE 03/14/2020 0838   HGBUR NEGATIVE 03/14/2020 0838   BILIRUBINUR NEGATIVE 03/14/2020 0838   KETONESUR 5 (A) 03/14/2020 0838   PROTEINUR NEGATIVE 03/14/2020 0838   NITRITE POSITIVE (A) 03/14/2020 0838   LEUKOCYTESUR NEGATIVE 03/14/2020 0838   Sepsis Labs: @LABRCNTIP (procalcitonin:4,lacticidven:4)  ) Recent Results (from the past 240 hour(s))  Blood culture (routine x 2)      Status: None (Preliminary result)   Collection Time: 03/13/20 10:15 PM   Specimen: BLOOD LEFT HAND  Result Value Ref Range Status   Specimen Description BLOOD LEFT HAND  Final   Special Requests   Final    BOTTLES DRAWN AEROBIC AND ANAEROBIC Blood Culture adequate volume   Culture   Final    NO GROWTH 1 DAY Performed at [facility], [address], [place], [place]    Report Status PENDING  Incomplete  Resp Panel by RT-PCR (Flu A&B, Covid) Nasopharyngeal Swab     Status: Abnormal   Collection Time: 03/14/20 12:59 AM   Specimen: Nasopharyngeal Swab; Nasopharyngeal(NP) swabs in vial transport medium  Result Value Ref Range Status   SARS Coronavirus 2 by RT PCR POSITIVE (A) NEGATIVE Final    Comment: RESULT CALLED TO, READ  BACK BY AND VERIFIED WITH: Eustaquio Maize RN 03/14/20 0305 JDW (NOTE) SARS-CoV-2 target nucleic acids are DETECTED.  The SARS-CoV-2 RNA is generally detectable in upper respiratory specimens during the acute phase of infection. Positive results are indicative of the presence of the identified virus, but do not rule out bacterial infection or co-infection with other pathogens not detected by the test. Clinical correlation with patient history and other diagnostic information is necessary to determine patient infection status. The expected result is Negative.  Fact Sheet for Patients: BloggerCourse.com  Fact Sheet for Healthcare Providers: SeriousBroker.it  This test is not yet approved or cleared by the [place] FDA and  has been authorized for detection and/or diagnosis of SARS-CoV-2 by FDA under an Emergency Use Authorization (EUA).  This EUA will remain in effect (meaning this test can be used ) for the duration of  the COVID-19 declaration under Section 564(b)(1) of the Act, 21 U.S.C. section 360bbb-3(b)(1), unless the authorization is terminated or revoked sooner.     Influenza A by PCR  NEGATIVE NEGATIVE Final   Influenza B by PCR NEGATIVE NEGATIVE Final    Comment: (NOTE) The Xpert Xpress SARS-CoV-2/FLU/RSV plus assay is intended as an aid in the diagnosis of influenza from Nasopharyngeal swab specimens and should not be used as a sole basis for treatment. Nasal washings and aspirates are unacceptable for Xpert Xpress SARS-CoV-2/FLU/RSV testing.  Fact Sheet for Patients: BloggerCourse.com  Fact Sheet for Healthcare Providers: SeriousBroker.it  This test is not yet approved or cleared by the [place] FDA and has been authorized for detection and/or diagnosis of SARS-CoV-2 by FDA under an Emergency Use Authorization (EUA). This EUA will remain in effect (meaning this test can be used) for the duration of the COVID-19 declaration under Section 564(b)(1) of the Act, 21 U.S.C. section 360bbb-3(b)(1), unless the authorization is terminated or revoked.  Performed at [facility] Lab, [address], [place], [place] [address]          Radiology Studies: CT HEAD WO CONTRAST  Result Date: 03/14/2020 CLINICAL DATA:  Transient ischemic attack, aphasia EXAM: CT HEAD WITHOUT CONTRAST TECHNIQUE: Contiguous axial images were obtained from the base of the skull through the vertex without intravenous contrast. COMPARISON:  08/30/2017 FINDINGS: Brain: Normal anatomic configuration. Parenchymal volume loss is commensurate with the patient's age. Mild periventricular white matter changes are present likely reflecting the sequela of small vessel ischemia. No abnormal intra or extra-axial mass lesion or fluid collection. No abnormal mass effect or midline shift. No evidence of acute intracranial hemorrhage or infarct. Ventricular size is normal. Cerebellum unremarkable. Vascular: No asymmetric hyperdense vasculature at the skull base. Skull: Intact Sinuses/Orbits: Paranasal sinuses are clear. Orbits are unremarkable. Other:  There is dense opacification of the left mastoid air cells as well as fluid within the left middle ear cavity. There is sclerosis and effacement of the mastoid air cells suggesting chronic process. Together, the findings are suggestive changes of chronic otomastoiditis. No cortical erosion. IMPRESSION: No evidence of acute intracranial hemorrhage or infarct. Mild senescent changes. Changes suggesting chronic left otomastoiditis. Clinical correlation is suggested. Electronically Signed   By: Helyn Numbers MD   On: 03/14/2020 00:12   CT CHEST ABDOMEN PELVIS W CONTRAST  Result Date: 03/14/2020 CLINICAL DATA:  Altered mental status EXAM: CT CHEST, ABDOMEN, AND PELVIS WITH CONTRAST TECHNIQUE: Multidetector CT imaging of the chest, abdomen and pelvis was performed following the standard protocol during bolus administration of intravenous contrast. CONTRAST:  OMNIPAQUE IOHEXOL 300  MG/ML  SOLN COMPARISON:  None. FINDINGS: Cardiovascular: There are no significant vascular findings. Mild scattered aortic atherosclerosis is seen. Coronary artery calcifications are noted. The heart size is normal. There is no pericardial thickening or effusion. Mediastinum/Nodes: There are no enlarged mediastinal, hilar or axillary lymph nodes. Small calcified lymph nodes seen within the right hila. The thyroid gland, trachea and esophagus demonstrate no significant findings. Lungs/Pleura: There are no areas consolidation. Mild streaky atelectasis seen at both lung bases. No suspicious pulmonary nodules. There is no pleural effusion. Upper abdomen: The visualized portion of the upper abdomen is unremarkable. Musculoskeletal/Chest wall: There is no chest wall mass or suspicious osseous finding. No acute osseous abnormality Abdomen/pelvis: Hepatobiliary: The liver is normal in density without focal abnormality.The main portal vein is patent. No evidence of calcified gallstones, gallbladder wall thickening or biliary dilatation.  Pancreas: Unremarkable. No pancreatic ductal dilatation or surrounding inflammatory changes. Spleen: Normal in size without focal abnormality. Adrenals/Urinary Tract: Both adrenal glands appear normal. The kidneys and collecting system appear normal without evidence of urinary tract calculus or hydronephrosis. Bladder is unremarkable. Stomach/Bowel: The stomach, and small bowel are unremarkable. There is mildly prominent air-filled loops of colon seen throughout measuring up to 5 cm in transverse dimension. No areas of wall thickening or surrounding inflammatory changes however are noted. Vascular/Lymphatic: There are no enlarged mesenteric, retroperitoneal, or pelvic lymph nodes. Scattered aortic atherosclerotic calcifications are seen without aneurysmal dilatation. Reproductive: The patient is status post hysterectomy. No adnexal masses or collections seen. Other: No evidence of abdominal wall mass or hernia. Musculoskeletal: No acute or significant osseous findings. IMPRESSION: No acute intrathoracic pathology to explain the patient's symptoms. Mildly prominent air-filled loops of colon seen throughout without definite evidence of bowel obstruction, which could be due to ileus Aortic Atherosclerosis (ICD10-I70.0). Electronically Signed   By: Jonna Clark M.D.   On: 03/14/2020 00:39   DG CHEST PORT 1 VIEW  Result Date: 03/15/2020 CLINICAL DATA:  Shortness of breath, COVID EXAM: PORTABLE CHEST 1 VIEW COMPARISON:  08/30/2017 FINDINGS: Heart is normal size. Low lung volumes. Bilateral perihilar and lower lobe opacities, right greater than left. Suspect small right pleural effusion. No pneumothorax or acute bony abnormality. IMPRESSION: Low lung volumes with bilateral lower lobe airspace opacities, right greater than left concerning for pneumonia. Suspect small right effusion. Electronically Signed   By: Charlett Nose M.D.   On: 03/15/2020 08:42        Scheduled Meds: . dexamethasone (DECADRON) injection   6 mg Intravenous Q24H  . divalproex  250 mg Oral BID  . enoxaparin (LOVENOX) injection  40 mg Subcutaneous Q12H  . insulin aspart  0-9 Units Subcutaneous TID WC  . linagliptin  5 mg Oral Daily  . senna-docusate  2 tablet Oral QHS  . sodium chloride flush  3 mL Intravenous Q12H  . sodium chloride flush  3 mL Intravenous Q12H  . tamoxifen  20 mg Oral Daily   Continuous Infusions: . sodium chloride    . sodium chloride Stopped (03/14/20 2020)  . remdesivir 100 mg in NS 100 mL 100 mg (03/15/20 1018)     LOS: 1 day    Time spent:  Zannie Cove, MD [facility]  03/15/2020, 11:28 AM

## 2020-03-15 NOTE — ED Notes (Addendum)
Pt saturated with urine. This RN and NT Marian Sorrow changed pt's underpants and linens. Pt currently combative and not keeping NRB mask on with O2 sat declining to 78%. Order placed by MD for soft wrist restaints. Pt O2 sat improved to 86%. Pt now placed on salters tx and 15 L NRB.

## 2020-03-15 NOTE — ED Notes (Signed)
The pt will not leave the non-rebreather in place sats are not accurate   The pulse ox is on her  Toe up until 1 hour ago her sats were between 90 and 100

## 2020-03-15 NOTE — ED Notes (Signed)
Tele Breakfast order placed 

## 2020-03-15 NOTE — Progress Notes (Signed)
   03/15/20 1422  Assess: MEWS Score  Temp 98 F (36.7 C)  BP 136/76  Pulse Rate (!) 117  ECG Heart Rate (!) 118  Resp 20  SpO2 98 %  Assess: MEWS Score  MEWS Temp 0  MEWS Systolic 0  MEWS Pulse 2  MEWS RR 0  MEWS LOC 0  MEWS Score 2  MEWS Score Color Yellow  Assess: if the MEWS score is Yellow or Red  Were vital signs taken at a resting state? Yes  Focused Assessment No change from prior assessment  Early Detection of Sepsis Score *See Row Information* Medium  MEWS guidelines implemented *See Row Information* Yes  Take Vital Signs  Increase Vital Sign Frequency  Yellow: Q 2hr X 2 then Q 4hr X 2, if remains yellow, continue Q 4hrs  Escalate  MEWS: Escalate Yellow: discuss with charge nurse/RN and consider discussing with provider and RRT  Notify: Charge Nurse/RN  Name of Charge Nurse/RN Notified Unice Cobble RN  Date Charge Nurse/RN Notified 03/15/20  Time Charge Nurse/RN Notified 1423  Document  Progress note created (see row info) Yes

## 2020-03-15 NOTE — ED Notes (Signed)
Attempt x1 for report to 5W

## 2020-03-15 NOTE — ED Notes (Signed)
Received bedside report from night shift RN. Pt visably agitated and fidgeting with NRB mask removing mask from face despite reminder to keep mask on. O2 saturation at 93% when mask is applied but decreases to mid 80s if pt removes mask. Despite reminders pt continues to remove mask.

## 2020-03-15 NOTE — ED Notes (Signed)
The pt appears comfortable 

## 2020-03-15 NOTE — Progress Notes (Signed)
RT was called by RN to come and assess pt. RT at bedside to find pt on NRB 15L with a SpO2 of 86%. RT placed pt on 15L salter. Pt's SpO2 decreased to 80%. Pt now on 15L salter along with 15L NRB. RT will continue to monitor pt.

## 2020-03-16 DIAGNOSIS — J9601 Acute respiratory failure with hypoxia: Secondary | ICD-10-CM | POA: Diagnosis not present

## 2020-03-16 DIAGNOSIS — U071 COVID-19: Secondary | ICD-10-CM | POA: Diagnosis not present

## 2020-03-16 LAB — COMPREHENSIVE METABOLIC PANEL
ALT: 36 U/L (ref 0–44)
AST: 39 U/L (ref 15–41)
Albumin: 2.6 g/dL — ABNORMAL LOW (ref 3.5–5.0)
Alkaline Phosphatase: 32 U/L — ABNORMAL LOW (ref 38–126)
Anion gap: 10 (ref 5–15)
BUN: 8 mg/dL (ref 8–23)
CO2: 22 mmol/L (ref 22–32)
Calcium: 7.9 mg/dL — ABNORMAL LOW (ref 8.9–10.3)
Chloride: 108 mmol/L (ref 98–111)
Creatinine, Ser: 0.57 mg/dL (ref 0.44–1.00)
GFR, Estimated: >60 mL/min (ref >60)
Glucose, Bld: 106 mg/dL — ABNORMAL HIGH (ref 70–99)
Potassium: 3.6 mmol/L (ref 3.5–5.1)
Sodium: 140 mmol/L (ref 135–145)
Total Bilirubin: 0.5 mg/dL (ref 0.3–1.2)
Total Protein: 5.1 g/dL — ABNORMAL LOW (ref 6.5–8.1)

## 2020-03-16 LAB — CBC WITH DIFFERENTIAL/PLATELET
Abs Immature Granulocytes: 0.03 10*3/uL (ref 0.00–0.07)
Basophils Absolute: 0 10*3/uL (ref 0.0–0.1)
Basophils Relative: 0 %
Eosinophils Absolute: 0 10*3/uL (ref 0.0–0.5)
Eosinophils Relative: 0 %
HCT: 35.5 % — ABNORMAL LOW (ref 36.0–46.0)
Hemoglobin: 12.2 g/dL (ref 12.0–15.0)
Immature Granulocytes: 0 %
Lymphocytes Relative: 24 %
Lymphs Abs: 2.7 10*3/uL (ref 0.7–4.0)
MCH: 31.3 pg (ref 26.0–34.0)
MCHC: 34.4 g/dL (ref 30.0–36.0)
MCV: 91 fL (ref 80.0–100.0)
Monocytes Absolute: 1.2 10*3/uL — ABNORMAL HIGH (ref 0.1–1.0)
Monocytes Relative: 11 %
Neutro Abs: 7.1 10*3/uL (ref 1.7–7.7)
Neutrophils Relative %: 65 %
Platelets: 150 10*3/uL (ref 150–400)
RBC: 3.9 MIL/uL (ref 3.87–5.11)
RDW: 14 % (ref 11.5–15.5)
WBC: 11 10*3/uL — ABNORMAL HIGH (ref 4.0–10.5)
nRBC: 0 % (ref 0.0–0.2)

## 2020-03-16 LAB — C-REACTIVE PROTEIN: CRP: 6.9 mg/dL — ABNORMAL HIGH (ref <1.0)

## 2020-03-16 LAB — GLUCOSE, CAPILLARY
Glucose-Capillary: 137 mg/dL — ABNORMAL HIGH (ref 70–99)
Glucose-Capillary: 142 mg/dL — ABNORMAL HIGH (ref 70–99)
Glucose-Capillary: 109 mg/dL — ABNORMAL HIGH (ref 70–99)
Glucose-Capillary: 96 mg/dL (ref 70–99)

## 2020-03-16 LAB — FERRITIN: Ferritin: 166 ng/mL (ref 11–307)

## 2020-03-16 LAB — D-DIMER, QUANTITATIVE: D-Dimer, Quant: 0.94 ug/mL-FEU — ABNORMAL HIGH (ref 0.00–0.50)

## 2020-03-16 MED ORDER — ENOXAPARIN SODIUM 40 MG/0.4ML ~~LOC~~ SOLN
40.0000 mg | SUBCUTANEOUS | Status: DC
  Administered 2020-03-17 – 2020-03-19 (×3): 40 mg via SUBCUTANEOUS
  Filled 2020-03-16 (×3): qty 0.4

## 2020-03-16 NOTE — Plan of Care (Signed)
  Problem: Education: Goal: Knowledge of risk factors and measures for prevention of condition will improve Outcome: Progressing   Problem: Coping: Goal: Psychosocial and spiritual needs will be supported Outcome: Progressing   Problem: Respiratory: Goal: Will maintain a patent airway Outcome: Progressing Goal: Complications related to the disease process, condition or treatment will be avoided or minimized Outcome: Progressing   Problem: Education: Goal: Knowledge of General Education information will improve Description: Including pain rating scale, medication(s)/side effects and non-pharmacologic comfort measures Outcome: Progressing   Problem: Health Behavior/Discharge Planning: Goal: Ability to manage health-related needs will improve Outcome: Progressing   Problem: Clinical Measurements: Goal: Ability to maintain clinical measurements within normal limits will improve Outcome: Progressing Goal: Will remain free from infection Outcome: Progressing Goal: Diagnostic test results will improve Outcome: Progressing Goal: Respiratory complications will improve Outcome: Progressing Goal: Cardiovascular complication will be avoided Outcome: Progressing   Problem: Activity: Goal: Risk for activity intolerance will decrease Outcome: Progressing   Problem: Nutrition: Goal: Adequate nutrition will be maintained Outcome: Progressing   Problem: Coping: Goal: Level of anxiety will decrease Outcome: Progressing   Problem: Elimination: Goal: Will not experience complications related to bowel motility Outcome: Progressing Goal: Will not experience complications related to urinary retention Outcome: Progressing

## 2020-03-16 NOTE — Progress Notes (Signed)
Ddimer<1, ok to reduce Lovenox to 40mg  SQ qday per Dr. .  Jomarie Longs, PharmD, BCIDP, AAHIVP, CPP Infectious Disease Pharmacist 03/16/2020 11:33 AM

## 2020-03-16 NOTE — Progress Notes (Signed)
PROGRESS NOTE    Paula Cantu  [id number] DOB: 08/16/1945 DOA: 03/13/2020 PCP: Trinidad Curet  Brief Narrative:Ms. Aurich is a 74 year old female with severe intellectual disability who resides in a group home, nonverbal at baseline, requires supervision and assistance with ADLs, history of type 2 diabetes mellitus, history of breast cancer was brought to the emergency room due to change in behavior, not acting like her usual self at the group home. -Upon arrival to the emergency room she was found to be hypoxic with O2 sats in the mid 80s on room air, slightly tachypneic, tachycardic with heart rate in the 130s. -CT head was unremarkable, CT chest abdomen pelvis noted mildly dilated loops of small bowel, her Covid PCR was positive, she was started on supplemental oxygen, remdesivir on Decadron, she would not leave her oxygen on and hence treated with Valium morphine and Haldol. -For first few days was extremely agitated, would not leave her oxygen on, refused all p.o.  Assessment & Plan:   Acute hypoxic respiratory failure SARS Covid 19 viral infection -CT chest on admission without infiltrates, repeat chest x-ray with bilateral lower lobe infiltrates -Was extremely agitated and restless for the first few days, would not leave her oxygen on, was briefly on high flow and even nonrebreather mask which she would not leave on  -Now improving, off oxygen today, appears more calm and less agitated as well -continue IV Decadron and remdesivir day 3 -Trend inflammatory markers -Haldol as needed, may be able to remove restraints and telemetry monitor today -Called patient's niece and guardian [name] on 12/29 and 12/30 and strongly recommended consideration of DNR  Encephalopathy -In the background of severe intellectual disability and nonverbal status -Most likely secondary to SARS COVID-19 infection, CT head unremarkable -Improving, less agitated today, finally starting to eat,  discontinue IV fluids  Severe intellectual disability Nonverbal, resides in a group home, ambulates with assistance and requires assistance in ADLs -Reportedly gets agitated in doctors offices  Type 2 diabetes mellitus -Continue linagliptin, sliding scale insulin  DNR discussion -Called patient's niece and POA [name] and strongly recommended consideration of DNR 12/30 and 12/31, she told me she would think about this and get back to [place].  DVT prophylaxis: Lovenox Code Status: Full code Family Communication: No family at bedside, called and updated patient's niece [name] 12/30 and 12/31 Disposition Plan:  Status is: Inpatient  Remains inpatient appropriate because:Inpatient level of care appropriate due to severity of illness   Dispo: The patient is from: Group home              Anticipated d/c is to: To be determined              Anticipated d/c date is: To be determined              Patient currently is not medically stable to d/c.  Consultants:     Procedures:   Antimicrobials:    Subjective: -More calm today, less agitation overnight, was able to be weaned off oxygen, was fed breakfast  Objective: Vitals:   03/16/20 0239 03/16/20 0400 03/16/20 0751 03/16/20 1153  BP: 134/82 (!) 158/98 126/83 (!) 99/59  Pulse: (!) 103 (!) 114 93 82  Resp: 14 15 13 15   Temp: 98.2 F (36.8 C) 98.8 F (37.1 C) 98.6 F (37 C) 98.5 F (36.9 C)  TempSrc: Axillary Axillary Axillary Axillary  SpO2:  95% 95% 96%  Weight:      Height:  Intake/Output Summary (Last 24 hours) at 03/16/2020 1428 Last data filed at 03/16/2020 1025 Gross per 24 hour  Intake 412.5 ml  Output 1425 ml  Net -1012.5 ml   Filed Weights   03/14/20 2158 03/15/20 1336  Weight: 68 kg 59.6 kg    Examination:  General exam: Eyes open, nonverbal, much more alert than yesterday, less agitated CVS: S1-S2, regular rate rhythm Lungs: Few basilar rhonchi Abdomen: Soft, nontender, bowel  sounds present Extremities: No edema  Skin: No rashes on exposed skin Psychiatry: Unable to assess    Data Reviewed:   CBC: Recent Labs  Lab 03/13/20 2214 03/15/20 0626 03/16/20 0143  WBC 12.4* 9.5 11.0*  NEUTROABS 10.5* 6.8 7.1  HGB 13.4 11.5* 12.2  HCT 40.6 36.4 35.5*  MCV 93.5 95.8 91.0  PLT 177 139* [id number]   Basic Metabolic Panel: Recent Labs  Lab 03/13/20 2214 03/15/20 0234 03/16/20 0143  NA 136 139 140  K 3.8 3.5 3.6  CL 105 107 108  CO2 21* 21* 22  GLUCOSE 139* 89 106*  BUN 10 8 8   CREATININE 0.68 0.67 0.57  CALCIUM 8.5* 7.6* 7.9*   GFR: Estimated Creatinine Clearance: 51.1 mL/min (by C-G formula based on SCr of 0.57 mg/dL). Liver Function Tests: Recent Labs  Lab 03/13/20 2214 03/15/20 0234 03/16/20 0143  AST 24 36 39  ALT 15 31 36  ALKPHOS 43 29* 32*  BILITOT 0.7 0.7 0.5  PROT 6.4* 4.9* 5.1*  ALBUMIN 3.4* 2.7* 2.6*   Recent Labs  Lab 03/13/20 2227  LIPASE 29   Recent Labs  Lab 03/14/20 0413  AMMONIA 55*   Coagulation Profile: Recent Labs  Lab 03/13/20 2214  INR 1.0   Cardiac Enzymes: No results for input(s): CKTOTAL, CKMB, CKMBINDEX, TROPONINI in the last 168 hours. BNP (last 3 results) No results for input(s): PROBNP in the last 8760 hours. HbA1C: Recent Labs    03/14/20 0411  HGBA1C 5.4   CBG: Recent Labs  Lab 03/15/20 0739 03/15/20 1723 03/15/20 2221 03/16/20 0824 03/16/20 1156  GLUCAP 96 118* 145* 137* 142*   Lipid Profile: Recent Labs    03/14/20 0327  TRIG 40   Thyroid Function Tests: Recent Labs    03/14/20 0413  TSH 1.184   Anemia Panel: Recent Labs    03/14/20 0413 03/15/20 0234 03/16/20 0143  VITAMINB12 187  --   --   FERRITIN  --  160 166   Urine analysis:    Component Value Date/Time   COLORURINE YELLOW 03/14/2020 0838   APPEARANCEUR CLEAR 03/14/2020 0838   LABSPEC >1.046 (H) 03/14/2020 0838   PHURINE 6.0 03/14/2020 0838   GLUCOSEU NEGATIVE 03/14/2020 0838   HGBUR NEGATIVE 03/14/2020  0838   BILIRUBINUR NEGATIVE 03/14/2020 0838   KETONESUR 5 (A) 03/14/2020 0838   PROTEINUR NEGATIVE 03/14/2020 0838   NITRITE POSITIVE (A) 03/14/2020 0838   LEUKOCYTESUR NEGATIVE 03/14/2020 0838   Sepsis Labs: @LABRCNTIP (procalcitonin:4,lacticidven:4)  ) Recent Results (from the past 240 hour(s))  Blood culture (routine x 2)     Status: None (Preliminary result)   Collection Time: 03/13/20 10:15 PM   Specimen: BLOOD LEFT HAND  Result Value Ref Range Status   Specimen Description BLOOD LEFT HAND  Final   Special Requests   Final    BOTTLES DRAWN AEROBIC AND ANAEROBIC Blood Culture adequate volume   Culture   Final    NO GROWTH 2 DAYS Performed at [facility], [place] [address] [place], [place] [address]  Report Status PENDING  Incomplete  Resp Panel by RT-PCR (Flu A&B, Covid) Nasopharyngeal Swab     Status: Abnormal   Collection Time: 03/14/20 12:59 AM   Specimen: Nasopharyngeal Swab; Nasopharyngeal(NP) swabs in vial transport medium  Result Value Ref Range Status   SARS Coronavirus 2 by RT PCR POSITIVE (A) NEGATIVE Final    Comment: RESULT CALLED TO, READ BACK BY AND VERIFIED WITH: S WIRTZ RN 03/14/20 0305 JDW (NOTE) SARS-CoV-2 target nucleic acids are DETECTED.  The SARS-CoV-2 RNA is generally detectable in upper respiratory specimens during the acute phase of infection. Positive results are indicative of the presence of the identified virus, but do not rule out bacterial infection or co-infection with other pathogens not detected by the test. Clinical correlation with patient history and other diagnostic information is necessary to determine patient infection status. The expected result is Negative.  Fact Sheet for Patients: EntrepreneurPulse.com.au  Fact Sheet for Healthcare Providers: IncredibleEmployment.be  This test is not yet approved or cleared by the [place] FDA and  has been authorized for detection and/or  diagnosis of SARS-CoV-2 by FDA under an Emergency Use Authorization (EUA).  This EUA will remain in effect (meaning this test can be used ) for the duration of  the COVID-19 declaration under Section 564(b)(1) of the Act, 21 U.S.C. section 360bbb-3(b)(1), unless the authorization is terminated or revoked sooner.     Influenza A by PCR NEGATIVE NEGATIVE Final   Influenza B by PCR NEGATIVE NEGATIVE Final    Comment: (NOTE) The Xpert Xpress SARS-CoV-2/FLU/RSV plus assay is intended as an aid in the diagnosis of influenza from Nasopharyngeal swab specimens and should not be used as a sole basis for treatment. Nasal washings and aspirates are unacceptable for Xpert Xpress SARS-CoV-2/FLU/RSV testing.  Fact Sheet for Patients: EntrepreneurPulse.com.au  Fact Sheet for Healthcare Providers: IncredibleEmployment.be  This test is not yet approved or cleared by the [place] FDA and has been authorized for detection and/or diagnosis of SARS-CoV-2 by FDA under an Emergency Use Authorization (EUA). This EUA will remain in effect (meaning this test can be used) for the duration of the COVID-19 declaration under Section 564(b)(1) of the Act, 21 U.S.C. section 360bbb-3(b)(1), unless the authorization is terminated or revoked.  Performed at [facility], [place] [address], [place], [place] [address]      Radiology Studies: DG CHEST PORT 1 VIEW  Result Date: 03/15/2020 CLINICAL DATA:  Shortness of breath, COVID EXAM: PORTABLE CHEST 1 VIEW COMPARISON:  08/30/2017 FINDINGS: Heart is normal size. Low lung volumes. Bilateral perihilar and lower lobe opacities, right greater than left. Suspect small right pleural effusion. No pneumothorax or acute bony abnormality. IMPRESSION: Low lung volumes with bilateral lower lobe airspace opacities, right greater than left concerning for pneumonia. Suspect small right effusion. Electronically Signed   By: Rolm Baptise  M.D.   On: 03/15/2020 08:42   Scheduled Meds: . dexamethasone (DECADRON) injection  6 mg Intravenous Q24H  . divalproex  250 mg Oral BID  . [START ON 03/17/2020] enoxaparin (LOVENOX) injection  40 mg Subcutaneous Q24H  . insulin aspart  0-9 Units Subcutaneous TID WC  . linagliptin  5 mg Oral Daily  . senna-docusate  2 tablet Oral QHS  . sodium chloride flush  3 mL Intravenous Q12H  . sodium chloride flush  3 mL Intravenous Q12H  . tamoxifen  20 mg Oral Daily   Continuous Infusions: . sodium chloride    . sodium chloride 50 mL/hr at 03/16/20 0425  .  remdesivir 100 mg in NS 100 mL 100 mg (03/16/20 0815)     LOS: 2 days    Time spent:  Zannie Cove, MD [facility]  03/16/2020, 2:28 PM

## 2020-03-17 DIAGNOSIS — J9601 Acute respiratory failure with hypoxia: Secondary | ICD-10-CM | POA: Diagnosis not present

## 2020-03-17 DIAGNOSIS — U071 COVID-19: Secondary | ICD-10-CM | POA: Diagnosis not present

## 2020-03-17 LAB — CBC WITH DIFFERENTIAL/PLATELET
Abs Immature Granulocytes: 0.07 10*3/uL (ref 0.00–0.07)
Basophils Absolute: 0 10*3/uL (ref 0.0–0.1)
Basophils Relative: 0 %
Eosinophils Absolute: 0 10*3/uL (ref 0.0–0.5)
Eosinophils Relative: 0 %
HCT: 37.3 % (ref 36.0–46.0)
Hemoglobin: 12.4 g/dL (ref 12.0–15.0)
Immature Granulocytes: 1 %
Lymphocytes Relative: 24 %
Lymphs Abs: 2.9 10*3/uL (ref 0.7–4.0)
MCH: 30.2 pg (ref 26.0–34.0)
MCHC: 33.2 g/dL (ref 30.0–36.0)
MCV: 90.8 fL (ref 80.0–100.0)
Monocytes Absolute: 1 10*3/uL (ref 0.1–1.0)
Monocytes Relative: 8 %
Neutro Abs: 8.4 10*3/uL — ABNORMAL HIGH (ref 1.7–7.7)
Neutrophils Relative %: 67 %
Platelets: 158 10*3/uL (ref 150–400)
RBC: 4.11 MIL/uL (ref 3.87–5.11)
RDW: 13.8 % (ref 11.5–15.5)
WBC: 12.5 10*3/uL — ABNORMAL HIGH (ref 4.0–10.5)
nRBC: 0 % (ref 0.0–0.2)

## 2020-03-17 LAB — COMPREHENSIVE METABOLIC PANEL
ALT: 30 U/L (ref 0–44)
AST: 31 U/L (ref 15–41)
Albumin: 2.6 g/dL — ABNORMAL LOW (ref 3.5–5.0)
Alkaline Phosphatase: 33 U/L — ABNORMAL LOW (ref 38–126)
Anion gap: 9 (ref 5–15)
BUN: 7 mg/dL — ABNORMAL LOW (ref 8–23)
CO2: 25 mmol/L (ref 22–32)
Calcium: 7.7 mg/dL — ABNORMAL LOW (ref 8.9–10.3)
Chloride: 105 mmol/L (ref 98–111)
Creatinine, Ser: 0.58 mg/dL (ref 0.44–1.00)
GFR, Estimated: >60 mL/min (ref >60)
Glucose, Bld: 95 mg/dL (ref 70–99)
Potassium: 3.1 mmol/L — ABNORMAL LOW (ref 3.5–5.1)
Sodium: 139 mmol/L (ref 135–145)
Total Bilirubin: 0.3 mg/dL (ref 0.3–1.2)
Total Protein: 5.1 g/dL — ABNORMAL LOW (ref 6.5–8.1)

## 2020-03-17 LAB — GLUCOSE, CAPILLARY
Glucose-Capillary: 139 mg/dL — ABNORMAL HIGH (ref 70–99)
Glucose-Capillary: 244 mg/dL — ABNORMAL HIGH (ref 70–99)
Glucose-Capillary: 91 mg/dL (ref 70–99)
Glucose-Capillary: 97 mg/dL (ref 70–99)

## 2020-03-17 LAB — C-REACTIVE PROTEIN: CRP: 5.2 mg/dL — ABNORMAL HIGH (ref <1.0)

## 2020-03-17 LAB — FERRITIN: Ferritin: 144 ng/mL (ref 11–307)

## 2020-03-17 LAB — D-DIMER, QUANTITATIVE: D-Dimer, Quant: 0.77 ug/mL-FEU — ABNORMAL HIGH (ref 0.00–0.50)

## 2020-03-17 MED ORDER — DEXAMETHASONE 6 MG PO TABS
6.0000 mg | ORAL_TABLET | Freq: Every day | ORAL | Status: DC
  Administered 2020-03-18 – 2020-03-19 (×2): 6 mg via ORAL
  Filled 2020-03-17 (×2): qty 1

## 2020-03-17 MED ORDER — POTASSIUM CHLORIDE 20 MEQ PO PACK
40.0000 meq | PACK | Freq: Once | ORAL | Status: AC
  Administered 2020-03-17: 40 meq via ORAL
  Filled 2020-03-17: qty 2

## 2020-03-17 MED ORDER — PANTOPRAZOLE SODIUM 40 MG PO TBEC
40.0000 mg | DELAYED_RELEASE_TABLET | Freq: Every day | ORAL | Status: DC
  Administered 2020-03-17 – 2020-03-19 (×3): 40 mg via ORAL
  Filled 2020-03-17 (×3): qty 1

## 2020-03-17 NOTE — Evaluation (Signed)
Physical Therapy Evaluation Patient Details Name: Paula Cantu MRN: [id number] DOB: 1945-05-21 Today's Date: 03/17/2020   History of Present Illness  75 year old female with severe intellectual disability who resides in a group home, nonverbal at baseline, requires supervision and assistance with ADLs, history of type 2 diabetes mellitus, history of breast cancer was brought to the emergency room 12/28 due to change in behavior, not acting like her usual self at the group home. Found to be in respiratory failure secondary to COVID requiring 6L O2 via [place] however unwilling to keep supplemental O2 in place Haldol and restraints required for agitation initially. Admitted for treament of COVID, acute metabolic encephalopathy, and tachycardia.  Clinical Impression  Per notes pt is resident of a group home and was able to ambulate with supervision and requires assist for ADLs. Pt limited in safe mobility by decreased baseline cognition in presence of increased weakness and decreased balance. With bed mobility pt incontinent of stool and with continual stooling through session due to diarrhea. Pt requires maxAx2 for bed mobility and for standing and transfer <>BSC. Pt able to stand for 3 min for standing pericare with maximal outside support. If pt able to return to group home she will be best served in familiar environment with HHPT, however if not able to return due to COVID status pt will need SNF level care at discharge. PT will continue to follow acutely.    Follow Up Recommendations Supervision/Assistance - 24 hour;Home health PT    Equipment Recommendations  None recommended by PT       Precautions / Restrictions Precautions Precautions: Fall Restrictions Weight Bearing Restrictions: No      Mobility  Bed Mobility Overal bed mobility: Needs Assistance Bed Mobility: Supine to Sit;Sit to Supine;Rolling Rolling: Max assist   Supine to sit: Max assist;+2 for physical assistance Sit to  supine: Max assist;+2 for physical assistance   General bed mobility comments: pt does not initiate movement with verbal or tactile cues, once initated pt provides limited assistance    Transfers Overall transfer level: Needs assistance Equipment used: None Transfers: Sit to/from [employer] Sit to Stand: Max assist;+2 physical assistance Stand pivot transfers: Max assist;+2 physical assistance       General transfer comment: pt requires maxAx2 for power up and steadying, and then to pivot to [facility], requires maxAx1 for standing for pericare x3 and for pivot transfer back to bed  Ambulation/Gait             General Gait Details: unable        Balance Overall balance assessment: Needs assistance Sitting-balance support: Feet supported;Bilateral upper extremity supported;Feet unsupported Sitting balance-Leahy Scale: Poor Sitting balance - Comments: pt very short in stature and difficult to sit EoB without feet supported. better with sitting on BSC   Standing balance support: During functional activity;Bilateral upper extremity supported Standing balance-Leahy Scale: Zero                               Pertinent Vitals/Pain Pain Assessment: Faces Faces Pain Scale: No hurt    Home Living Family/patient expects to be discharged to:: Group home                      Prior Function Level of Independence: Needs assistance   Gait / Transfers Assistance Needed: requires standby assist for ambulation  ADL's / Homemaking Assistance Needed: assistance required for ADLs and iADLs  Comments: PLOF collected from prior notes        Extremity/Trunk Assessment   Upper Extremity Assessment Upper Extremity Assessment: Defer to OT evaluation    Lower Extremity Assessment Lower Extremity Assessment: Difficult to assess due to impaired cognition;Generalized weakness       Communication   Communication: Other (comment) (non-verbal)   Cognition Arousal/Alertness: Awake/alert Behavior During Therapy: WFL for tasks assessed/performed Overall Cognitive Status: History of cognitive impairments - at baseline                                        General Comments General comments (skin integrity, edema, etc.): very tachycardic, max noted HR 156bpm, SaO2 on RA with good pleth wave form 88-92%O2        Assessment/Plan    PT Assessment Patient needs continued PT services  PT Problem List Decreased cognition;Decreased safety awareness;Cardiopulmonary status limiting activity;Decreased mobility;Decreased balance;Decreased activity tolerance;Decreased strength       PT Treatment Interventions DME instruction;Gait training;Functional mobility training;Therapeutic activities;Therapeutic exercise;Balance training;Cognitive remediation;Patient/family education    PT Goals (Current goals can be found in the Care Plan section)  Acute Rehab PT Goals Patient Stated Goal: nonverbal PT Goal Formulation: Patient unable to participate in goal setting Time For Goal Achievement: 03/31/20 Potential to Achieve Goals: Fair    Frequency Min 3X/week    AM-PAC PT "6 Clicks" Mobility  Outcome Measure Help needed turning from your back to your side while in a flat bed without using bedrails?: A Lot Help needed moving from lying on your back to sitting on the side of a flat bed without using bedrails?: A Lot Help needed moving to and from a bed to a chair (including a wheelchair)?: A Lot Help needed standing up from a chair using your arms (e.g., wheelchair or bedside chair)?: A Lot Help needed to walk in hospital room?: Total Help needed climbing 3-5 steps with a railing? : Total 6 Click Score: 10    End of Session   Activity Tolerance: Patient tolerated treatment well Patient left: in bed;with call bell/phone within reach;with bed alarm set (bed in chair position) Nurse Communication: Mobility status PT Visit  Diagnosis: Unsteadiness on feet (R26.81);Other abnormalities of gait and mobility (R26.89);Muscle weakness (generalized) (M62.81);Difficulty in walking, not elsewhere classified (R26.2)    Time: [id number] PT Time Calculation (min) (ACUTE ONLY): 50 min   Charges:   PT Evaluation $PT Eval Moderate Complexity: 1 Mod PT Treatments $Therapeutic Activity: 8-22 mins        Esaias Cleavenger B. Migdalia Dk PT, DPT Acute Rehabilitation Services Pager [phone] Office [phone]   [place] 03/17/2020, 1:30 PM

## 2020-03-17 NOTE — Evaluation (Signed)
Clinical/Bedside Swallow Evaluation Patient Details  Name: Paula Cantu MRN: [id number] Date of Birth: 1945-06-07  Today's Date: 03/17/2020 Time: SLP Start Time (ACUTE ONLY): 1500 SLP Stop Time (ACUTE ONLY): 1520 SLP Time Calculation (min) (ACUTE ONLY): 20 min  Past Medical History:  Past Medical History:  Diagnosis Date  . Arthritis   . Cancer (HCC)    endometrial  . Constipation   . Diabetes mellitus without complication ([place])    takes metformin  . Frequent falls   . Gait disorder   . Incontinence of bowel   . Incontinence of urine   . Intellectual disability    severe intellectual disability, non-verbal  . Osteopenia   . Seizures ([place])    remote history; possible febrile childhood seizure   Past Surgical History:  Past Surgical History:  Procedure Laterality Date  . ABDOMINAL HYSTERECTOMY    . BREAST LUMPECTOMY Left 03/04/2013   Procedure: LEFT BREAST LUMPECTOMY;  Surgeon: [name], MD;  Location: [place];  Service: General;  Laterality: Left;  . RADIOLOGY WITH ANESTHESIA N/A 09/16/2016   Procedure: RADIOLOGY WITH ANESTHESIA MRI OF THE BRAIN WITH AND WITHOUT;  Surgeon: Radiologist, Medication, MD;  Location: [place];  Service: Radiology;  Laterality: N/A;  . RADIOLOGY WITH ANESTHESIA N/A 09/15/2017   Procedure: MRI WITH ANESTHESIA OF THE BRAIN WITH AND WITHOUT AND CERVICAL WITHOUT CONTRAST;  Surgeon: Radiologist, Medication, MD;  Location: [place];  Service: Radiology;  Laterality: N/A;   HPI:  75 year old female with severe intellectual disability who resides in a group home, nonverbal at baseline, requires supervision and assistance with ADLs, history of type 2 diabetes mellitus, history of breast cancer was brought to the emergency room 12/28 due to change in behavior, not acting like her usual self at the group home. Found to be in respiratory failure secondary to [place] requiring 6L O2 via [place] however unwilling to keep supplemental O2 in place Haldol and restraints  required for agitation initially. Admitted for treament of COVID, acute metabolic encephalopathy, and tachycardia.   Assessment / Plan / Recommendation Clinical Impression  Patient presents with a mild-mod oral dysphagia with suspected cognitive component. She was unable to masticate regular texture solids and although she attempted, promptly spit out when SLP indicated she could do so. Patient is edentulous and no dentures were present in room. She tolerated straw sips of thin liquids (water) and bites of puree (applesauce) without significant oral or pharyngeal difficulty and no coughing, throat clearing or other overt s/s that could be indicative of aspiration or penetration. Patient is aphonic and so unable to determine vocal quality and unable to r/o silent aspiration. Patient's oxygen saturations and RR were both steady and WFL but her heart monitor would frequently show she was in tachycardia. SLP Visit Diagnosis: Dysphagia, unspecified (R13.10)    Aspiration Risk  Mild aspiration risk    Diet Recommendation Dysphagia 1 (Puree);Thin liquid   Liquid Administration via: Straw;Cup Medication Administration: Whole meds with puree Supervision: Full supervision/cueing for compensatory strategies;Staff to assist with self feeding;Patient able to self feed Compensations: Slow rate;Small sips/bites;Minimize environmental distractions Postural Changes: Seated upright at 90 degrees    Other  Recommendations Oral Care Recommendations: Oral care BID;Staff/trained caregiver to provide oral care   Follow up Recommendations Other (comment) (TBD pending progress)      Frequency and Duration min 1 x/week  1 week       Prognosis Prognosis for Safe Diet Advancement: Fair Barriers to Reach Goals: Cognitive deficits  Swallow Study   General Date of Onset: 03/14/20 HPI: 75 year old female with severe intellectual disability who resides in a group home, nonverbal at baseline, requires  supervision and assistance with ADLs, history of type 2 diabetes mellitus, history of breast cancer was brought to the emergency room 12/28 due to change in behavior, not acting like her usual self at the group home. Found to be in respiratory failure secondary to COVID requiring 6L O2 via [place] however unwilling to keep supplemental O2 in place Haldol and restraints required for agitation initially. Admitted for treament of COVID, acute metabolic encephalopathy, and tachycardia. Type of Study: Bedside Swallow Evaluation Previous Swallow Assessment: none Diet Prior to this Study: Regular;Thin liquids Temperature Spikes Noted: Yes (99.5) Respiratory Status: Room air History of Recent Intubation: No Behavior/Cognition: Alert;Cooperative;Pleasant mood;Requires cueing Oral Cavity Assessment: Other (comment) (difficult to fully assess secondary to patient unable to follow directions adequately, but appeared [facility]) Oral Care Completed by SLP: Other (Comment) (patient unable to participate in adequate oral care) Oral Cavity - Dentition: Edentulous Self-Feeding Abilities: Able to feed self;Needs set up Patient Positioning: Upright in bed Baseline Vocal Quality: Aphonic Volitional Cough: Cognitively unable to elicit Volitional Swallow: Unable to elicit    Oral/Motor/Sensory Function Overall Oral Motor/Sensory Function: Within functional limits   Ice Chips     Thin Liquid Thin Liquid: Within functional limits Presentation: Straw;Self Fed    Nectar Thick     Honey Thick     Puree Puree: Within functional limits Presentation: Spoon   Solid     Solid: Impaired Oral Phase Impairments: Impaired mastication Oral Phase Functional Implications: Impaired mastication Other Comments: Patient unable to masticate small piece of graham cracker and SLP and patient spit out after informed she could do so.      Angela Nevin, MA, CCC-SLP Speech Therapy [facility] Acute Rehab

## 2020-03-17 NOTE — Progress Notes (Addendum)
PROGRESS NOTE    Paula Cantu  [id number] DOB: 1945/08/12 DOA: 03/13/2020 PCP: Trinidad Curet  Brief Narrative:Ms. Aspinall is a 74/F with severe intellectual disability who resides in a group home, nonverbal at baseline, requires supervision and assistance with ADLs, history of type 2 diabetes mellitus, history of breast cancer was brought to the emergency room due to change in behavior, not acting like her usual self at the group home. -Upon arrival to the emergency room she was found to be hypoxic with O2 sats in the mid 80s on room air, tachypneic, tachycardic with heart rate in the 130s. -CT head was unremarkable, CT chest abdomen pelvis noted mildly dilated loops of small bowel, her Covid PCR was positive, she was started on supplemental oxygen, remdesivir on Decadron, she would not leave her oxygen on and hence treated with Valium morphine and Haldol. -For first few days was extremely agitated, would not keep her oxygen on, refused all p.o., strongly recommended consideration of DNR to her Niece/guardian Katharine Look. -Now improving considerably, more calmer, weaned off O2  Assessment & Plan:   Acute hypoxic respiratory failure SARS Covid 19 viral infection -CT chest on admission without infiltrates, repeat chest x-ray with bilateral lower lobe infiltrates -Was extremely agitated and restless for the first few days, would not leave her oxygen on, was briefly on high flow and even nonrebreather mask which she would not leave on  -Now improving, off oxygen since yesterday afternoon  -Also much calmer and less agitated  -Day 4 of IV steroids and remdesivir, will change to oral Decadron today -Inflammatory markers improving -Discontinue restraints and telemetry -per staff coughing after meals, will ask SLP to eval -PT OT eval, may need SNF prior to discharge back to group home  Encephalopathy -First 48 hours of hospitalization, she was extremely agitated I -Most likely secondary to  SARS COVID-19 infection, CT head unremarkable -Improving, agitation is remarkably better, starting to eat now, off IV fluids, discontinued restraints, on Haldol as needed  Severe intellectual disability Nonverbal, resides in a group home, ambulates with assistance and requires assistance in ADLs -Reportedly gets agitated in doctors offices  Type 2 diabetes mellitus -Continue linagliptin, sliding scale insulin  DNR discussion -Called patient's niece and POA [name] and recommended consideration of DNR 12/29 and 12/30, she told me she would think about this and get back to [place].  DVT prophylaxis: Lovenox Code Status: Full code Family Communication: No family at bedside, called and updated patient's niece [name] 12/29, 12/30 and 1/1 Disposition Plan:  Status is: Inpatient  Remains inpatient appropriate because:Inpatient level of care appropriate due to severity of illness   Dispo: The patient is from: Group home              Anticipated d/c is to: Back to group home with supervision versus ST rehab              Anticipated d/c date is: Possibly 48 hours              Patient currently is not medically stable to d/c.  Consultants:     Procedures:   Antimicrobials:    Subjective: -No agitation overnight, stayed calm almost half the day yesterday and so far today, weaned off oxygen this morning  Objective: Vitals:   03/16/20 1153 03/16/20 2013 03/17/20 0424 03/17/20 0724  BP: (!) 99/59 138/79 126/83 138/88  Pulse: 82 84 100 (!) 105  Resp: 15 17 (!) 21 14  Temp: 98.5 F (36.9  C) 98.5 F (36.9 C) 98.5 F (36.9 C) 99.5 F (37.5 C)  TempSrc: Axillary Axillary Axillary Axillary  SpO2: 96% 97% 93% 90%  Weight:      Height:        Intake/Output Summary (Last 24 hours) at 03/17/2020 1440 Last data filed at 03/17/2020 1300 Gross per 24 hour  Intake 480 ml  Output 1100 ml  Net -620 ml   Filed Weights   03/14/20 2158 03/15/20 1336  Weight: 68 kg 59.6 kg     Examination:  General exam: Eyes open, more alert, less agitated, nonverbal  CVS: S1-S2, regular rate rhythm Lungs: Few basilar rhonchi, otherwise diminished Abdomen: Soft, nontender, bowel sounds present Extremities: No edema Skin: No rashes on exposed skin Psychiatry: Unable to assess    Data Reviewed:   CBC: Recent Labs  Lab 03/13/20 2214 03/15/20 0626 03/16/20 0143 03/17/20 0409  WBC 12.4* 9.5 11.0* 12.5*  NEUTROABS 10.5* 6.8 7.1 8.4*  HGB 13.4 11.5* 12.2 12.4  HCT 40.6 36.4 35.5* 37.3  MCV 93.5 95.8 91.0 90.8  PLT 177 139* 150 0000000   Basic Metabolic Panel: Recent Labs  Lab 03/13/20 2214 03/15/20 0234 03/16/20 0143 03/17/20 0409  NA 136 139 140 139  K 3.8 3.5 3.6 3.1*  CL 105 107 108 105  CO2 21* 21* 22 25  GLUCOSE 139* 89 106* 95  BUN 10 8 8  7*  CREATININE 0.68 0.67 0.57 0.58  CALCIUM 8.5* 7.6* 7.9* 7.7*   GFR: Estimated Creatinine Clearance: 51.1 mL/min (by C-G formula based on SCr of 0.58 mg/dL). Liver Function Tests: Recent Labs  Lab 03/13/20 2214 03/15/20 0234 03/16/20 0143 03/17/20 0409  AST 24 36 39 31  ALT 15 31 36 30  ALKPHOS 43 29* 32* 33*  BILITOT 0.7 0.7 0.5 0.3  PROT 6.4* 4.9* 5.1* 5.1*  ALBUMIN 3.4* 2.7* 2.6* 2.6*   Recent Labs  Lab 03/13/20 2227  LIPASE 29   Recent Labs  Lab 03/14/20 0413  AMMONIA 55*   Coagulation Profile: Recent Labs  Lab 03/13/20 2214  INR 1.0   Cardiac Enzymes: No results for input(s): CKTOTAL, CKMB, CKMBINDEX, TROPONINI in the last 168 hours. BNP (last 3 results) No results for input(s): PROBNP in the last 8760 hours. HbA1C: No results for input(s): HGBA1C in the last 72 hours. CBG: Recent Labs  Lab 03/16/20 1156 03/16/20 1634 03/16/20 2012 03/17/20 0729 03/17/20 1130  GLUCAP 142* 109* 96 139* 244*   Lipid Profile: No results for input(s): CHOL, HDL, LDLCALC, TRIG, CHOLHDL, LDLDIRECT in the last 72 hours. Thyroid Function Tests: No results for input(s): TSH, T4TOTAL, FREET4,  T3FREE, THYROIDAB in the last 72 hours. Anemia Panel: Recent Labs    03/16/20 0143 03/17/20 0409  FERRITIN 166 144   Urine analysis:    Component Value Date/Time   COLORURINE YELLOW 03/14/2020 [place] 03/14/2020 0838   LABSPEC >1.046 (H) 03/14/2020 0838   PHURINE 6.0 03/14/2020 0838   GLUCOSEU NEGATIVE 03/14/2020 0838   HGBUR NEGATIVE 03/14/2020 0838   BILIRUBINUR NEGATIVE 03/14/2020 0838   KETONESUR 5 (A) 03/14/2020 0838   PROTEINUR NEGATIVE 03/14/2020 0838   NITRITE POSITIVE (A) 03/14/2020 0838   LEUKOCYTESUR NEGATIVE 03/14/2020 0838   Sepsis Labs: @LABRCNTIP (procalcitonin:4,lacticidven:4)  ) Recent Results (from the past 240 hour(s))  Blood culture (routine x 2)     Status: None (Preliminary result)   Collection Time: 03/13/20 10:15 PM   Specimen: BLOOD LEFT HAND  Result Value Ref Range Status  Specimen Description BLOOD LEFT HAND  Final   Special Requests   Final    BOTTLES DRAWN AEROBIC AND ANAEROBIC Blood Culture adequate volume   Culture   Final    NO GROWTH 3 DAYS Performed at [facility], [address] [place], [place] [address]    Report Status PENDING  Incomplete  Resp Panel by RT-PCR (Flu A&B, Covid) Nasopharyngeal Swab     Status: Abnormal   Collection Time: 03/14/20 12:59 AM   Specimen: Nasopharyngeal Swab; Nasopharyngeal(NP) swabs in vial transport medium  Result Value Ref Range Status   SARS Coronavirus 2 by RT PCR POSITIVE (A) NEGATIVE Final    Comment: RESULT CALLED TO, READ BACK BY AND VERIFIED WITH: S WIRTZ RN 03/14/20 0305 JDW (NOTE) SARS-CoV-2 target nucleic acids are DETECTED.  The SARS-CoV-2 RNA is generally detectable in upper respiratory specimens during the acute phase of infection. Positive results are indicative of the presence of the identified virus, but do not rule out bacterial infection or co-infection with other pathogens not detected by the test. Clinical correlation with patient history and other  diagnostic information is necessary to determine patient infection status. The expected result is Negative.  Fact Sheet for Patients: BloggerCourse.com  Fact Sheet for Healthcare Providers: SeriousBroker.it  This test is not yet approved or cleared by the [place] FDA and  has been authorized for detection and/or diagnosis of SARS-CoV-2 by FDA under an Emergency Use Authorization (EUA).  This EUA will remain in effect (meaning this test can be used ) for the duration of  the COVID-19 declaration under Section 564(b)(1) of the Act, 21 U.S.C. section 360bbb-3(b)(1), unless the authorization is terminated or revoked sooner.     Influenza A by PCR NEGATIVE NEGATIVE Final   Influenza B by PCR NEGATIVE NEGATIVE Final    Comment: (NOTE) The Xpert Xpress SARS-CoV-2/FLU/RSV plus assay is intended as an aid in the diagnosis of influenza from Nasopharyngeal swab specimens and should not be used as a sole basis for treatment. Nasal washings and aspirates are unacceptable for Xpert Xpress SARS-CoV-2/FLU/RSV testing.  Fact Sheet for Patients: BloggerCourse.com  Fact Sheet for Healthcare Providers: SeriousBroker.it  This test is not yet approved or cleared by the [place] FDA and has been authorized for detection and/or diagnosis of SARS-CoV-2 by FDA under an Emergency Use Authorization (EUA). This EUA will remain in effect (meaning this test can be used) for the duration of the COVID-19 declaration under Section 564(b)(1) of the Act, 21 U.S.C. section 360bbb-3(b)(1), unless the authorization is terminated or revoked.  Performed at [facility], [address], [place], [place] [address]      Radiology Studies: No results found. Scheduled Meds: . dexamethasone (DECADRON) injection  6 mg Intravenous Q24H  . divalproex  250 mg Oral BID  . enoxaparin (LOVENOX) injection   40 mg Subcutaneous Q24H  . insulin aspart  0-9 Units Subcutaneous TID WC  . linagliptin  5 mg Oral Daily  . pantoprazole  40 mg Oral Q1200  . senna-docusate  2 tablet Oral QHS  . sodium chloride flush  3 mL Intravenous Q12H  . sodium chloride flush  3 mL Intravenous Q12H  . tamoxifen  20 mg Oral Daily   Continuous Infusions: . sodium chloride    . remdesivir 100 mg in NS 100 mL 100 mg (03/17/20 0845)     LOS: 3 days    Time spent:  Zannie Cove, MD [facility]  03/17/2020, 2:40 PM

## 2020-03-17 NOTE — Evaluation (Signed)
Occupational Therapy Evaluation Patient Details Name: Paula Cantu MRN: [id number] DOB: [birth year] Today's Date: 03/17/2020    History of Present Illness 75 year old female with severe intellectual disability who resides in a group home, nonverbal at baseline, requires supervision and assistance with ADLs, history of type 2 diabetes mellitus, history of breast cancer was brought to the emergency room 12/28 due to change in behavior, not acting like her usual self at the group home. Found to be in respiratory failure secondary to COVID requiring 6L O2 via  however unwilling to keep supplemental O2 in place Haldol and restraints required for agitation initially. Admitted for treament of COVID, acute metabolic encephalopathy, and tachycardia.   Clinical Impression   PTA pt living in group home and requiring assist for ADL/mobility. Pt is nonverbal at baseline. At time of eval, pt able to complete bed mobility and sit <> stand transfers with max A +2. Pt was incontinent of bowels and transferred to [facility]. Pt stood from [facility] with max A +1 while +2 person performed total A peri hygiene. Pt was able to wash own face with min A. Given current status, recommend pt return to group home. If this is not possible will need SNF. Will follow while acutely admitted per POC listed below.     Follow Up Recommendations  Other (comment) (Return to group home)    Equipment Recommendations  None recommended by OT    Recommendations for Other Services       Precautions / Restrictions Precautions Precautions: Fall Restrictions Weight Bearing Restrictions: No      Mobility Bed Mobility Overal bed mobility: Needs Assistance Bed Mobility: Supine to Sit;Sit to Supine;Rolling Rolling: Max assist   Supine to sit: Max assist;+2 for physical assistance Sit to supine: Max assist;+2 for physical assistance   General bed mobility comments: pt does not initiate movement with verbal or tactile cues, once initated  pt provides limited assistance    Transfers Overall transfer level: Needs assistance Equipment used: None Transfers: Sit to/from [employer] Sit to Stand: Max assist;+2 physical assistance Stand pivot transfers: Max assist;+2 physical assistance       General transfer comment: pt requires maxAx2 for power up and steadying, and then to pivot to [facility], requires maxAx1 for standing for pericare x3 and for pivot transfer back to bed    Balance Overall balance assessment: Needs assistance Sitting-balance support: Feet supported;Bilateral upper extremity supported;Feet unsupported Sitting balance-Leahy Scale: Poor Sitting balance - Comments: pt very short in stature and difficult to sit EoB without feet supported. better with sitting on BSC   Standing balance support: During functional activity;Bilateral upper extremity supported Standing balance-Leahy Scale: Zero                             ADL either performed or assessed with clinical judgement   ADL Overall ADL's : Needs assistance/impaired Eating/Feeding: Maximal assistance Eating/Feeding Details (indicate cue type and reason): cues to do herself, shows habits of being fed by others. Grooming: Minimal assistance;Sitting;Wash/dry face Grooming Details (indicate cue type and reason): set up with wash cloth and initiated to wash face                     Toileting- Clothing Manipulation and Hygiene: Total assistance;Sit to/from stand;Bed level Toileting - Clothing Manipulation Details (indicate cue type and reason): incontinent of bowels       General ADL Comments: otherwise total A for ADLs, suspect  she is close to baseline. Can sit <> stand with max A. Sat on BSC for peri area clean up     Vision Patient Visual Report: No change from baseline       Perception     Praxis      Pertinent Vitals/Pain Pain Assessment: Faces Faces Pain Scale: No hurt     Hand Dominance      Extremity/Trunk Assessment Upper Extremity Assessment Upper Extremity Assessment: Generalized weakness;Difficult to assess due to impaired cognition   Lower Extremity Assessment Lower Extremity Assessment: Generalized weakness;Difficult to assess due to impaired cognition       Communication Communication Communication: Other (comment) (non verbal)   Cognition Arousal/Alertness: Awake/alert Behavior During Therapy: WFL for tasks assessed/performed Overall Cognitive Status: History of cognitive impairments - at baseline                                 General Comments: hx of intellectual disability. Non verbal at baseline. Follows simple commands appropriately   General Comments  very tachycardic, max noted HR 156bpm, SaO2 on RA with good pleth wave form 88-92%O2    Exercises     Shoulder Instructions      Home Living Family/patient expects to be discharged to:: Group home                                        Prior Functioning/Environment Level of Independence: Needs assistance  Gait / Transfers Assistance Needed: requires standby assist for ambulation ADL's / Homemaking Assistance Needed: assistance required for ADLs and iADLs Communication / Swallowing Assistance Needed: non-verbal at baseline Comments: PLOF collected from prior notes        OT Problem List: Decreased strength;Decreased knowledge of use of DME or AE;Decreased activity tolerance;Decreased cognition;Impaired balance (sitting and/or standing);Decreased safety awareness      OT Treatment/Interventions: Self-care/ADL training;Therapeutic exercise;Patient/family education;Balance training;Energy conservation;Therapeutic activities;DME and/or AE instruction;Cognitive remediation/compensation    OT Goals(Current goals can be found in the care plan section) Acute Rehab OT Goals Patient Stated Goal: nonverbal OT Goal Formulation: Patient unable to participate in goal  setting Time For Goal Achievement: 03/31/20 Potential to Achieve Goals: Fair  OT Frequency: Min 1X/week   Barriers to D/C:            Co-evaluation              AM-PAC OT "6 Clicks" Daily Activity     Outcome Measure Help from another person eating meals?: A Lot Help from another person taking care of personal grooming?: A Lot Help from another person toileting, which includes using toliet, bedpan, or urinal?: Total Help from another person bathing (including washing, rinsing, drying)?: Total Help from another person to put on and taking off regular upper body clothing?: Total Help from another person to put on and taking off regular lower body clothing?: Total 6 Click Score: 8   End of Session Nurse Communication: Mobility status  Activity Tolerance: Patient tolerated treatment well Patient left: in bed;with call bell/phone within reach;with bed alarm set  OT Visit Diagnosis: Unsteadiness on feet (R26.81);Other abnormalities of gait and mobility (R26.89);Muscle weakness (generalized) (M62.81);Other symptoms and signs involving cognitive function                Time: [id number] OT Time Calculation (min): 50 min Charges:  OT General Charges $  OT Visit: 1 Visit OT Evaluation $OT Eval Moderate Complexity: 1 Mod  Zenovia Jarred, MSOT, OTR/L Acute Rehabilitation Services [facility] Office Number: [phone] Pager: [phone]    Zenovia Jarred 03/17/2020, 4:55 PM

## 2020-03-18 DIAGNOSIS — J9601 Acute respiratory failure with hypoxia: Secondary | ICD-10-CM | POA: Diagnosis not present

## 2020-03-18 DIAGNOSIS — U071 COVID-19: Secondary | ICD-10-CM | POA: Diagnosis not present

## 2020-03-18 LAB — CBC WITH DIFFERENTIAL/PLATELET
Abs Immature Granulocytes: 0.05 10*3/uL (ref 0.00–0.07)
Basophils Absolute: 0 10*3/uL (ref 0.0–0.1)
Basophils Relative: 0 %
Eosinophils Absolute: 0 10*3/uL (ref 0.0–0.5)
Eosinophils Relative: 0 %
HCT: 36.6 % (ref 36.0–46.0)
Hemoglobin: 12.2 g/dL (ref 12.0–15.0)
Immature Granulocytes: 1 %
Lymphocytes Relative: 31 %
Lymphs Abs: 3 10*3/uL (ref 0.7–4.0)
MCH: 30.3 pg (ref 26.0–34.0)
MCHC: 33.3 g/dL (ref 30.0–36.0)
MCV: 91 fL (ref 80.0–100.0)
Monocytes Absolute: 0.9 10*3/uL (ref 0.1–1.0)
Monocytes Relative: 9 %
Neutro Abs: 5.9 10*3/uL (ref 1.7–7.7)
Neutrophils Relative %: 59 %
Platelets: 176 10*3/uL (ref 150–400)
RBC: 4.02 MIL/uL (ref 3.87–5.11)
RDW: 13.7 % (ref 11.5–15.5)
WBC: 9.9 10*3/uL (ref 4.0–10.5)
nRBC: 0 % (ref 0.0–0.2)

## 2020-03-18 LAB — FERRITIN: Ferritin: 157 ng/mL (ref 11–307)

## 2020-03-18 LAB — GLUCOSE, CAPILLARY
Glucose-Capillary: 85 mg/dL (ref 70–99)
Glucose-Capillary: 115 mg/dL — ABNORMAL HIGH (ref 70–99)
Glucose-Capillary: 173 mg/dL — ABNORMAL HIGH (ref 70–99)
Glucose-Capillary: 80 mg/dL (ref 70–99)

## 2020-03-18 LAB — COMPREHENSIVE METABOLIC PANEL
ALT: 24 U/L (ref 0–44)
AST: 24 U/L (ref 15–41)
Albumin: 2.4 g/dL — ABNORMAL LOW (ref 3.5–5.0)
Alkaline Phosphatase: 29 U/L — ABNORMAL LOW (ref 38–126)
Anion gap: 8 (ref 5–15)
BUN: 11 mg/dL (ref 8–23)
CO2: 24 mmol/L (ref 22–32)
Calcium: 8.1 mg/dL — ABNORMAL LOW (ref 8.9–10.3)
Chloride: 108 mmol/L (ref 98–111)
Creatinine, Ser: 0.66 mg/dL (ref 0.44–1.00)
GFR, Estimated: >60 mL/min (ref >60)
Glucose, Bld: 107 mg/dL — ABNORMAL HIGH (ref 70–99)
Potassium: 3.8 mmol/L (ref 3.5–5.1)
Sodium: 140 mmol/L (ref 135–145)
Total Bilirubin: 0.5 mg/dL (ref 0.3–1.2)
Total Protein: 5 g/dL — ABNORMAL LOW (ref 6.5–8.1)

## 2020-03-18 LAB — D-DIMER, QUANTITATIVE: D-Dimer, Quant: 0.68 ug/mL-FEU — ABNORMAL HIGH (ref 0.00–0.50)

## 2020-03-18 LAB — C-REACTIVE PROTEIN: CRP: 4.1 mg/dL — ABNORMAL HIGH (ref <1.0)

## 2020-03-18 NOTE — Plan of Care (Signed)

## 2020-03-18 NOTE — Progress Notes (Signed)
PROGRESS NOTE    Paula Cantu  FUX:[id number] DOB: [birth year] DOA: 03/13/2020 PCP: Lucretia Field   Brief Narrative: Taken from prior notes. Ms. Brys is a 74/F with severe intellectual disability who resides in a group home, nonverbal at baseline, requires supervision and assistance with ADLs, history of type 2 diabetes mellitus, history of breast cancer was brought to the emergency room due to change in behavior, not acting like her usual self at the group home. -Upon arrival to the emergency room she was found to be hypoxic with O2 sats in the mid 80s on room air, tachypneic, tachycardic with heart rate in the 130s. -CT head was unremarkable, CT chest abdomen pelvis noted mildly dilated loops of small bowel, her Covid PCR was positive, she was started on supplemental oxygen, remdesivir on Decadron, she would not leave her oxygen on and hence treated with Valium morphine and Haldol. -For first few days was extremely agitated, would not keep her oxygen on, refused all p.o., strongly recommended consideration of DNR to her Niece/guardian Dois Davenport. -Now improving considerably, more calmer, weaned off O2.  Subjective: Patient has no new complaint today.  No nursing concerns.  Patient is nonverbal at baseline. Saturating well on room air.  Assessment & Plan:   Principal Problem:   Acute hypoxemic respiratory failure due to COVID-19 [facility]) Active Problems:   Mental disability   Malignant neoplasm of overlapping sites of left breast in female, estrogen receptor positive (HCC)   Sinus tachycardia   Acute encephalopathy   Pneumonia due to COVID-19 virus  Acute hypoxic respiratory failure secondary to COVID-19 pneumonia.  Initial chest x-ray without any infiltrate, repeat x-ray with bilateral infiltrate consistent with COVID-19 pneumonia. Inflammatory markers improving.  Weaned off from oxygen. We will complete the course of remdesivir today. -Continue with steroid for another 5  days. -Continue with supportive care. -Can go back to her group home tomorrow as they will not take her over the weekend.  Acute metabolic encephalopathy.  Most likely secondary to COVID-19 infection.  CT head without any acute changes.  She was very agitated during first 48 hours of hospitalization.  Improved.  Appears at her baseline.  Severe intellectual disability Nonverbal,resides in a group home, ambulates with assistance and requires assistance in ADLs -Reportedly gets agitated in doctors offices  Type 2 diabetes mellitus -Continue linagliptin, sliding scale insulin  DNR discussion -Called patient's niece and POA Evangeline Dakin and recommended consideration of DNR 12/29 and 12/30, she would think about this and get back to [place].  Objective: Vitals:   03/17/20 1723 03/17/20 2000 03/18/20 0517 03/18/20 1508  BP:  127/74 140/89   Pulse: (!) 108 (!) 109 100   Resp: 17 20 (!) 23   Temp: 98.9 F (37.2 C) 98.9 F (37.2 C) 97.9 F (36.6 C)   TempSrc: Axillary Axillary Axillary (P) Axillary  SpO2: 96% 90% 93%   Weight:      Height:        Intake/Output Summary (Last 24 hours) at 03/18/2020 1540 Last data filed at 03/18/2020 0500 Gross per 24 hour  Intake --  Output 100 ml  Net -100 ml   Filed Weights   03/14/20 2158 03/15/20 1336  Weight: 68 kg 59.6 kg    Examination:  General exam: Appears calm and comfortable  Respiratory system: Clear to auscultation. Respiratory effort normal. Cardiovascular system: S1 & S2 heard, RRR.  Gastrointestinal system: Soft, nontender, nondistended, bowel sounds positive. Central nervous system: Alert and following some commands. Extremities:  No edema, no cyanosis, pulses intact and symmetrical. Psychiatry: Judgement and insight appear impaired.    DVT prophylaxis: Lovenox Code Status: Full Family Communication: Cousin sandra was updated on phone. Disposition Plan:  Status is: Inpatient  Remains inpatient appropriate  because:Inpatient level of care appropriate due to severity of illness   Dispo: The patient is from: Group home              Anticipated d/c is to: Group home              Anticipated d/c date is: 1 day              Patient currently is medically stable to d/c.   Consultants:   None  Procedures:  Antimicrobials:   Data Reviewed: I have personally reviewed following labs and imaging studies  CBC: Recent Labs  Lab 03/13/20 2214 03/15/20 0626 03/16/20 0143 03/17/20 0409 03/18/20 0133  WBC 12.4* 9.5 11.0* 12.5* 9.9  NEUTROABS 10.5* 6.8 7.1 8.4* 5.9  HGB 13.4 11.5* 12.2 12.4 12.2  HCT 40.6 36.4 35.5* 37.3 36.6  MCV 93.5 95.8 91.0 90.8 91.0  PLT 177 139* 150 158 0000000   Basic Metabolic Panel: Recent Labs  Lab 03/13/20 2214 03/15/20 0234 03/16/20 0143 03/17/20 0409 03/18/20 0133  NA 136 139 140 139 140  K 3.8 3.5 3.6 3.1* 3.8  CL 105 107 108 105 108  CO2 21* 21* 22 25 24   GLUCOSE 139* 89 106* 95 107*  BUN 10 8 8  7* 11  CREATININE 0.68 0.67 0.57 0.58 0.66  CALCIUM 8.5* 7.6* 7.9* 7.7* 8.1*   GFR: Estimated Creatinine Clearance: 51.1 mL/min (by C-G formula based on SCr of 0.66 mg/dL). Liver Function Tests: Recent Labs  Lab 03/13/20 2214 03/15/20 0234 03/16/20 0143 03/17/20 0409 03/18/20 0133  AST 24 36 39 31 24  ALT 15 31 36 30 24  ALKPHOS 43 29* 32* 33* 29*  BILITOT 0.7 0.7 0.5 0.3 0.5  PROT 6.4* 4.9* 5.1* 5.1* 5.0*  ALBUMIN 3.4* 2.7* 2.6* 2.6* 2.4*   Recent Labs  Lab 03/13/20 2227  LIPASE 29   Recent Labs  Lab 03/14/20 0413  AMMONIA 55*   Coagulation Profile: Recent Labs  Lab 03/13/20 2214  INR 1.0   Cardiac Enzymes: No results for input(s): CKTOTAL, CKMB, CKMBINDEX, TROPONINI in the last 168 hours. BNP (last 3 results) No results for input(s): PROBNP in the last 8760 hours. HbA1C: No results for input(s): HGBA1C in the last 72 hours. CBG: Recent Labs  Lab 03/17/20 1130 03/17/20 1721 03/17/20 2114 03/18/20 0722 03/18/20 1205   GLUCAP 244* 91 97 85 115*   Lipid Profile: No results for input(s): CHOL, HDL, LDLCALC, TRIG, CHOLHDL, LDLDIRECT in the last 72 hours. Thyroid Function Tests: No results for input(s): TSH, T4TOTAL, FREET4, T3FREE, THYROIDAB in the last 72 hours. Anemia Panel: Recent Labs    03/17/20 0409 03/18/20 0133  FERRITIN 144 157   Sepsis Labs: Recent Labs  Lab 03/13/20 2217 03/14/20 0327  PROCALCITON  --  <0.10  LATICACIDVEN 1.4  --     Recent Results (from the past 240 hour(s))  Blood culture (routine x 2)     Status: None (Preliminary result)   Collection Time: 03/13/20 10:15 PM   Specimen: BLOOD LEFT HAND  Result Value Ref Range Status   Specimen Description BLOOD LEFT HAND  Final   Special Requests   Final    BOTTLES DRAWN AEROBIC AND ANAEROBIC Blood Culture adequate volume   Culture  Final    NO GROWTH 4 DAYS Performed at [facility], [place] [address] [place], [place] [address]    Report Status PENDING  Incomplete  Resp Panel by RT-PCR (Flu A&B, Covid) Nasopharyngeal Swab     Status: Abnormal   Collection Time: 03/14/20 12:59 AM   Specimen: Nasopharyngeal Swab; Nasopharyngeal(NP) swabs in vial transport medium  Result Value Ref Range Status   SARS Coronavirus 2 by RT PCR POSITIVE (A) NEGATIVE Final    Comment: RESULT CALLED TO, READ BACK BY AND VERIFIED WITH: S WIRTZ RN 03/14/20 0305 JDW (NOTE) SARS-CoV-2 target nucleic acids are DETECTED.  The SARS-CoV-2 RNA is generally detectable in upper respiratory specimens during the acute phase of infection. Positive results are indicative of the presence of the identified virus, but do not rule out bacterial infection or co-infection with other pathogens not detected by the test. Clinical correlation with patient history and other diagnostic information is necessary to determine patient infection status. The expected result is Negative.  Fact Sheet for Patients: EntrepreneurPulse.com.au  Fact Sheet  for Healthcare Providers: IncredibleEmployment.be  This test is not yet approved or cleared by the [place] FDA and  has been authorized for detection and/or diagnosis of SARS-CoV-2 by FDA under an Emergency Use Authorization (EUA).  This EUA will remain in effect (meaning this test can be used ) for the duration of  the COVID-19 declaration under Section 564(b)(1) of the Act, 21 U.S.C. section 360bbb-3(b)(1), unless the authorization is terminated or revoked sooner.     Influenza A by PCR NEGATIVE NEGATIVE Final   Influenza B by PCR NEGATIVE NEGATIVE Final    Comment: (NOTE) The Xpert Xpress SARS-CoV-2/FLU/RSV plus assay is intended as an aid in the diagnosis of influenza from Nasopharyngeal swab specimens and should not be used as a sole basis for treatment. Nasal washings and aspirates are unacceptable for Xpert Xpress SARS-CoV-2/FLU/RSV testing.  Fact Sheet for Patients: EntrepreneurPulse.com.au  Fact Sheet for Healthcare Providers: IncredibleEmployment.be  This test is not yet approved or cleared by the [place] FDA and has been authorized for detection and/or diagnosis of SARS-CoV-2 by FDA under an Emergency Use Authorization (EUA). This EUA will remain in effect (meaning this test can be used) for the duration of the COVID-19 declaration under Section 564(b)(1) of the Act, 21 U.S.C. section 360bbb-3(b)(1), unless the authorization is terminated or revoked.  Performed at [facility], [place] [address], [place], [place] [address]      Radiology Studies: No results found.  Scheduled Meds: . dexamethasone  6 mg Oral Daily  . divalproex  250 mg Oral BID  . enoxaparin (LOVENOX) injection  40 mg Subcutaneous Q24H  . insulin aspart  0-9 Units Subcutaneous TID WC  . linagliptin  5 mg Oral Daily  . pantoprazole  40 mg Oral Q1200  . senna-docusate  2 tablet Oral QHS  . sodium chloride flush  3 mL  Intravenous Q12H  . sodium chloride flush  3 mL Intravenous Q12H  . tamoxifen  20 mg Oral Daily   Continuous Infusions: . sodium chloride       LOS: 4 days   Time spent: 35 minutes  Lorella Nimrod, MD [facility]  If 7PM-7AM, please contact night-coverage [email or web address]  03/18/2020, 3:40 PM   This record has been created using Systems analyst. Errors have been sought and corrected,but may not always be located. Such creation errors do not reflect on the standard of care.

## 2020-03-19 DIAGNOSIS — U071 COVID-19: Secondary | ICD-10-CM | POA: Diagnosis not present

## 2020-03-19 DIAGNOSIS — J9601 Acute respiratory failure with hypoxia: Secondary | ICD-10-CM | POA: Diagnosis not present

## 2020-03-19 LAB — CBC WITH DIFFERENTIAL/PLATELET
Abs Immature Granulocytes: 0.09 10*3/uL — ABNORMAL HIGH (ref 0.00–0.07)
Basophils Absolute: 0 10*3/uL (ref 0.0–0.1)
Basophils Relative: 0 %
Eosinophils Absolute: 0 10*3/uL (ref 0.0–0.5)
Eosinophils Relative: 0 %
HCT: 40.2 % (ref 36.0–46.0)
Hemoglobin: 13.5 g/dL (ref 12.0–15.0)
Immature Granulocytes: 1 %
Lymphocytes Relative: 17 %
Lymphs Abs: 2.7 10*3/uL (ref 0.7–4.0)
MCH: 30.3 pg (ref 26.0–34.0)
MCHC: 33.6 g/dL (ref 30.0–36.0)
MCV: 90.3 fL (ref 80.0–100.0)
Monocytes Absolute: 1.3 10*3/uL — ABNORMAL HIGH (ref 0.1–1.0)
Monocytes Relative: 8 %
Neutro Abs: 11.8 10*3/uL — ABNORMAL HIGH (ref 1.7–7.7)
Neutrophils Relative %: 74 %
Platelets: 192 10*3/uL (ref 150–400)
RBC: 4.45 MIL/uL (ref 3.87–5.11)
RDW: 13.7 % (ref 11.5–15.5)
WBC: 15.9 10*3/uL — ABNORMAL HIGH (ref 4.0–10.5)
nRBC: 0 % (ref 0.0–0.2)

## 2020-03-19 LAB — CULTURE, BLOOD (ROUTINE X 2)
Culture: NO GROWTH
Special Requests: ADEQUATE

## 2020-03-19 LAB — COMPREHENSIVE METABOLIC PANEL
ALT: 28 U/L (ref 0–44)
AST: 28 U/L (ref 15–41)
Albumin: 2.8 g/dL — ABNORMAL LOW (ref 3.5–5.0)
Alkaline Phosphatase: 36 U/L — ABNORMAL LOW (ref 38–126)
Anion gap: 9 (ref 5–15)
BUN: 11 mg/dL (ref 8–23)
CO2: 27 mmol/L (ref 22–32)
Calcium: 8.3 mg/dL — ABNORMAL LOW (ref 8.9–10.3)
Chloride: 105 mmol/L (ref 98–111)
Creatinine, Ser: 0.69 mg/dL (ref 0.44–1.00)
GFR, Estimated: >60 mL/min (ref >60)
Glucose, Bld: 112 mg/dL — ABNORMAL HIGH (ref 70–99)
Potassium: 3.7 mmol/L (ref 3.5–5.1)
Sodium: 141 mmol/L (ref 135–145)
Total Bilirubin: 0.8 mg/dL (ref 0.3–1.2)
Total Protein: 5.5 g/dL — ABNORMAL LOW (ref 6.5–8.1)

## 2020-03-19 LAB — FERRITIN: Ferritin: 181 ng/mL (ref 11–307)

## 2020-03-19 LAB — GLUCOSE, CAPILLARY
Glucose-Capillary: 104 mg/dL — ABNORMAL HIGH (ref 70–99)
Glucose-Capillary: 152 mg/dL — ABNORMAL HIGH (ref 70–99)
Glucose-Capillary: 100 mg/dL — ABNORMAL HIGH (ref 70–99)

## 2020-03-19 LAB — C-REACTIVE PROTEIN: CRP: 4.3 mg/dL — ABNORMAL HIGH (ref <1.0)

## 2020-03-19 LAB — D-DIMER, QUANTITATIVE: D-Dimer, Quant: 0.74 ug/mL-FEU — ABNORMAL HIGH (ref 0.00–0.50)

## 2020-03-19 MED ORDER — DEXAMETHASONE 6 MG PO TABS: 6.0000 mg | ORAL_TABLET | Freq: Every day | ORAL | 0 refills | Status: AC

## 2020-03-19 MED ORDER — GUAIFENESIN-DM 100-10 MG/5ML PO SYRP: 10.0000 mL | ORAL_SOLUTION | ORAL | 0 refills | Status: DC | PRN

## 2020-03-19 MED ORDER — PANTOPRAZOLE SODIUM 40 MG PO TBEC: 40.0000 mg | DELAYED_RELEASE_TABLET | Freq: Every day | ORAL | 0 refills | Status: DC

## 2020-03-19 NOTE — TOC Transition Note (Signed)
Transition of Care [facility]) - CM/SW Discharge Note   Patient Details  Name: Paula Cantu MRN: [id number] Date of Birth: 10-11-1945  Transition of Care [facility]) CM/SW Contact:  Mearl Latin, LCSW Phone Number: 03/19/2020, 3:36 PM   Clinical Narrative:    Patient will DC to: [facility] Anticipated DC date: 03/19/20 Family notified: Niece, Engineer, site by: Sharin Mons   Per MD patient ready for DC to Group home. RN to call report prior to discharge [phone]). RN, patient, patient's family, and facility notified of DC. Discharge Summary sent to facility. DC packet on chart. Ambulance transport requested for patient.   CSW will sign off for now as social work intervention is no longer needed. Please consult [place] again if new needs arise.      Final next level of care: Home w Home Health Services Barriers to Discharge: No Barriers Identified   Patient Goals and CMS Choice   CMS Medicare.gov Compare Post Acute Care list provided to:: Patient Represenative (must comment) Choice offered to / list presented to : [facility] POA / Guardian  Discharge Placement                Patient to be transferred to facility by: [facility] Name of family member notified: niece Patient and family notified of of transfer: 03/19/20  Discharge Plan and Services   Discharge Planning Services: CM Consult                                 Social Determinants of Health (SDOH) Interventions     Readmission Risk Interventions No flowsheet data found.

## 2020-03-19 NOTE — Discharge Summary (Signed)
Physician Discharge Summary  Paula Cantu [id number] DOB: 1945-12-23 DOA: 03/13/2020  PCP: Trinidad Curet  Admit date: 03/13/2020 Discharge date: 03/19/2020  Admitted From: Group home Disposition: Group home  Recommendations for Outpatient Follow-up:  1. Follow up with PCP in 1-2 weeks 2. Please obtain BMP/CBC in one week 3. Please follow up on the following pending results: None  Home Health: N/A Equipment/Devices: N/A Discharge Condition: Stable CODE STATUS: Full Diet recommendation: Heart Healthy / Carb Modified   Brief/Interim Summary: Ms. Lightcap is a 74/Fwith severe intellectual disability who resides in a group home, nonverbal at baseline, requires supervision and assistance with ADLs, history of type 2 diabetes mellitus, history of breast cancer was brought to the emergency room due to change in behavior, not acting like her usual self at the group home. -Upon arrival to the emergency room she was found to be hypoxic with O2 sats in the mid 80s on room air, tachypneic, tachycardic with heart rate in the 130s. -CT head was unremarkable, CT chest abdomen pelvis noted mildly dilated loops of small bowel, her Covid PCR was positive, she was started on supplemental oxygen, remdesivir on Decadron, she would not leave her oxygen on and hence treated with Valium morphine and Haldol. -For first few days was extremely agitated, would notkeepher oxygen on, refused all p.o., strongly recommended consideration of DNR to her Niece/guardian Katharine Look.  She completed a course of remdesivir, weaned off from oxygen, now saturating well on room air.  She will continue with Decadron for another 5 more days. Was also given a cough medication to be used as needed.  She will continue with rest of her home medications and follow-up with her providers.  Discharge Diagnoses:  Principal Problem:   Acute hypoxemic respiratory failure due to COVID-19 [facility]) Active Problems:   Mental disability    Malignant neoplasm of overlapping sites of left breast in female, estrogen receptor positive ([place])   Sinus tachycardia   Acute encephalopathy   Pneumonia due to COVID-19 virus   Discharge Instructions  Discharge Instructions    Diet - low sodium heart healthy   Complete by: As directed    Discharge instructions   Complete by: As directed    It was pleasure taking care of you. Continue taking steroid for 5 more days. Follow up with PCP in 1-2 weeks.   Increase activity slowly   Complete by: As directed      Allergies as of 03/19/2020      Reactions   Lactose Intolerance (gi) Other (See Comments)   On nursing home records   Bacillus Other (See Comments)   UNSPECIFIED REACTION    Ppd [tuberculin Purified Protein Derivative] Other (See Comments)   UNSPECIFIED REACTION  On nursing home records      Medication List    TAKE these medications   dexamethasone 6 MG tablet Commonly known as: DECADRON Take 1 tablet (6 mg total) by mouth daily for 5 days.   divalproex 125 MG capsule Commonly known as: Depakote Sprinkles Take 2 capsules (250 mg total) by mouth 2 (two) times daily.   guaiFENesin-dextromethorphan 100-10 MG/5ML syrup Commonly known as: ROBITUSSIN DM Take 10 mLs by mouth every 4 (four) hours as needed for cough.   menthol-zinc oxide powder Apply 1 application topically 2 (two) times daily. Apply under breasts   metFORMIN 850 MG tablet Commonly known as: GLUCOPHAGE Take 850 mg by mouth daily with breakfast.   pantoprazole 40 MG tablet Commonly known as: PROTONIX Take 1  tablet (40 mg total) by mouth daily at 12 noon.   promethazine 25 MG tablet Commonly known as: PHENERGAN Take 25 mg by mouth See admin instructions. Every 4-6 hours prn vomiting 2 or more times in 4 hours. Notify md if symptoms persist over 12 hours What changed: Another medication with the same name was removed. Continue taking this medication, and follow the directions you see here.    sennosides-docusate sodium 8.6-50 MG tablet Commonly known as: SENOKOT-S Take 2 tablets by mouth at bedtime.   tamoxifen 20 MG tablet Commonly known as: NOLVADEX Take 20 mg by mouth daily.   vitamin C 500 MG tablet Commonly known as: ASCORBIC ACID Take 500 mg by mouth 2 (two) times daily.   Vitamin D 50 MCG (2000 UT) tablet Take 2,000 Units by mouth daily.       Follow-up Information    [facility] [place]. Schedule an appointment as soon as possible for a visit.   Specialty: Family Medicine Contact information: [address] [place] [address] [phone]              Allergies  Allergen Reactions  . Lactose Intolerance (Gi) Other (See Comments)    On nursing home records  . Bacillus Other (See Comments)    UNSPECIFIED REACTION   . Ppd [Tuberculin Purified Protein Derivative] Other (See Comments)    UNSPECIFIED REACTION  On nursing home records    Consultations:  None  Procedures/Studies: CT HEAD WO CONTRAST  Result Date: 03/14/2020 CLINICAL DATA:  Transient ischemic attack, aphasia EXAM: CT HEAD WITHOUT CONTRAST TECHNIQUE: Contiguous axial images were obtained from the base of the skull through the vertex without intravenous contrast. COMPARISON:  08/30/2017 FINDINGS: Brain: Normal anatomic configuration. Parenchymal volume loss is commensurate with the patient's age. Mild periventricular white matter changes are present likely reflecting the sequela of small vessel ischemia. No abnormal intra or extra-axial mass lesion or fluid collection. No abnormal mass effect or midline shift. No evidence of acute intracranial hemorrhage or infarct. Ventricular size is normal. Cerebellum unremarkable. Vascular: No asymmetric hyperdense vasculature at the skull base. Skull: Intact Sinuses/Orbits: Paranasal sinuses are clear. Orbits are unremarkable. Other: There is dense opacification of the left mastoid air cells as well as fluid within the left middle ear cavity.  There is sclerosis and effacement of the mastoid air cells suggesting chronic process. Together, the findings are suggestive changes of chronic otomastoiditis. No cortical erosion. IMPRESSION: No evidence of acute intracranial hemorrhage or infarct. Mild senescent changes. Changes suggesting chronic left otomastoiditis. Clinical correlation is suggested. Electronically Signed   By: Fidela Salisbury MD   On: 03/14/2020 00:12   CT CHEST ABDOMEN PELVIS W CONTRAST  Result Date: 03/14/2020 CLINICAL DATA:  Altered mental status EXAM: CT CHEST, ABDOMEN, AND PELVIS WITH CONTRAST TECHNIQUE: Multidetector CT imaging of the chest, abdomen and pelvis was performed following the standard protocol during bolus administration of intravenous contrast. CONTRAST:  161mL OMNIPAQUE IOHEXOL 300 MG/ML  SOLN COMPARISON:  None. FINDINGS: Cardiovascular: There are no significant vascular findings. Mild scattered aortic atherosclerosis is seen. Coronary artery calcifications are noted. The heart size is normal. There is no pericardial thickening or effusion. Mediastinum/Nodes: There are no enlarged mediastinal, hilar or axillary lymph nodes. Small calcified lymph nodes seen within the right hila. The thyroid gland, trachea and esophagus demonstrate no significant findings. Lungs/Pleura: There are no areas consolidation. Mild streaky atelectasis seen at both lung bases. No suspicious pulmonary nodules. There is no pleural effusion. Upper abdomen: The visualized  portion of the upper abdomen is unremarkable. Musculoskeletal/Chest wall: There is no chest wall mass or suspicious osseous finding. No acute osseous abnormality Abdomen/pelvis: Hepatobiliary: The liver is normal in density without focal abnormality.The main portal vein is patent. No evidence of calcified gallstones, gallbladder wall thickening or biliary dilatation. Pancreas: Unremarkable. No pancreatic ductal dilatation or surrounding inflammatory changes. Spleen: Normal in size  without focal abnormality. Adrenals/Urinary Tract: Both adrenal glands appear normal. The kidneys and collecting system appear normal without evidence of urinary tract calculus or hydronephrosis. Bladder is unremarkable. Stomach/Bowel: The stomach, and small bowel are unremarkable. There is mildly prominent air-filled loops of colon seen throughout measuring up to 5 cm in transverse dimension. No areas of wall thickening or surrounding inflammatory changes however are noted. Vascular/Lymphatic: There are no enlarged mesenteric, retroperitoneal, or pelvic lymph nodes. Scattered aortic atherosclerotic calcifications are seen without aneurysmal dilatation. Reproductive: The patient is status post hysterectomy. No adnexal masses or collections seen. Other: No evidence of abdominal wall mass or hernia. Musculoskeletal: No acute or significant osseous findings. IMPRESSION: No acute intrathoracic pathology to explain the patient's symptoms. Mildly prominent air-filled loops of colon seen throughout without definite evidence of bowel obstruction, which could be due to ileus Aortic Atherosclerosis (ICD10-I70.0). Electronically Signed   By: Jonna Clark M.D.   On: 03/14/2020 00:39   DG CHEST PORT 1 VIEW  Result Date: 03/15/2020 CLINICAL DATA:  Shortness of breath, COVID EXAM: PORTABLE CHEST 1 VIEW COMPARISON:  08/30/2017 FINDINGS: Heart is normal size. Low lung volumes. Bilateral perihilar and lower lobe opacities, right greater than left. Suspect small right pleural effusion. No pneumothorax or acute bony abnormality. IMPRESSION: Low lung volumes with bilateral lower lobe airspace opacities, right greater than left concerning for pneumonia. Suspect small right effusion. Electronically Signed   By: Charlett Nose M.D.   On: 03/15/2020 08:42     Subjective: Patient was resting comfortably when seen today.  No new concerns.  Discharge Exam: Vitals:   03/19/20 0534 03/19/20 0614  BP: 133/70 119/90  Pulse: 100 (!)  109  Resp: 20 19  Temp: 98.4 F (36.9 C) 98.8 F (37.1 C)  SpO2: 95% 96%   Vitals:   03/18/20 1508 03/18/20 2148 03/19/20 0534 03/19/20 0614  BP:  121/89 133/70 119/90  Pulse:  (!) 105 100 (!) 109  Resp:  18 20 19   Temp:  (!) 97.1 F (36.2 C) 98.4 F (36.9 C) 98.8 F (37.1 C)  TempSrc: (P) Axillary Axillary Axillary Axillary  SpO2:  94% 95% 96%  Weight:      Height:        General: Pt is alert, awake, not in acute distress Cardiovascular: RRR, S1/S2 +, no rubs, no gallops Respiratory: CTA bilaterally, no wheezing, no rhonchi Abdominal: Soft, NT, ND, bowel sounds + Extremities: no edema, no cyanosis   The results of significant diagnostics from this hospitalization (including imaging, microbiology, ancillary and laboratory) are listed below for reference.    Microbiology: Recent Results (from the past 240 hour(s))  Blood culture (routine x 2)     Status: None (Preliminary result)   Collection Time: 03/13/20 10:15 PM   Specimen: BLOOD LEFT HAND  Result Value Ref Range Status   Specimen Description BLOOD LEFT HAND  Final   Special Requests   Final    BOTTLES DRAWN AEROBIC AND ANAEROBIC Blood Culture adequate volume   Culture   Final    NO GROWTH 4 DAYS Performed at [facility], [address]  [place], [place] [address]    Report Status PENDING  Incomplete  Resp Panel by RT-PCR (Flu A&B, Covid) Nasopharyngeal Swab     Status: Abnormal   Collection Time: 03/14/20 12:59 AM   Specimen: Nasopharyngeal Swab; Nasopharyngeal(NP) swabs in vial transport medium  Result Value Ref Range Status   SARS Coronavirus 2 by RT PCR POSITIVE (A) NEGATIVE Final    Comment: RESULT CALLED TO, READ BACK BY AND VERIFIED WITH: S WIRTZ RN 03/14/20 0305 JDW (NOTE) SARS-CoV-2 target nucleic acids are DETECTED.  The SARS-CoV-2 RNA is generally detectable in upper respiratory specimens during the acute phase of infection. Positive results are indicative of the presence of the identified  virus, but do not rule out bacterial infection or co-infection with other pathogens not detected by the test. Clinical correlation with patient history and other diagnostic information is necessary to determine patient infection status. The expected result is Negative.  Fact Sheet for Patients: EntrepreneurPulse.com.au  Fact Sheet for Healthcare Providers: IncredibleEmployment.be  This test is not yet approved or cleared by the [place] FDA and  has been authorized for detection and/or diagnosis of SARS-CoV-2 by FDA under an Emergency Use Authorization (EUA).  This EUA will remain in effect (meaning this test can be used ) for the duration of  the COVID-19 declaration under Section 564(b)(1) of the Act, 21 U.S.C. section 360bbb-3(b)(1), unless the authorization is terminated or revoked sooner.     Influenza A by PCR NEGATIVE NEGATIVE Final   Influenza B by PCR NEGATIVE NEGATIVE Final    Comment: (NOTE) The Xpert Xpress SARS-CoV-2/FLU/RSV plus assay is intended as an aid in the diagnosis of influenza from Nasopharyngeal swab specimens and should not be used as a sole basis for treatment. Nasal washings and aspirates are unacceptable for Xpert Xpress SARS-CoV-2/FLU/RSV testing.  Fact Sheet for Patients: EntrepreneurPulse.com.au  Fact Sheet for Healthcare Providers: IncredibleEmployment.be  This test is not yet approved or cleared by the [place] FDA and has been authorized for detection and/or diagnosis of SARS-CoV-2 by FDA under an Emergency Use Authorization (EUA). This EUA will remain in effect (meaning this test can be used) for the duration of the COVID-19 declaration under Section 564(b)(1) of the Act, 21 U.S.C. section 360bbb-3(b)(1), unless the authorization is terminated or revoked.  Performed at [facility], [place] [address] [place],  [address]      Labs: BNP (last 3  results) No results for input(s): BNP in the last 8760 hours. Basic Metabolic Panel: Recent Labs  Lab 03/15/20 0234 03/16/20 0143 03/17/20 0409 03/18/20 0133 03/19/20 0450  NA 139 140 139 140 141  K 3.5 3.6 3.1* 3.8 3.7  CL 107 108 105 108 105  CO2 21* 22 25 24 27   GLUCOSE 89 106* 95 107* 112*  BUN 8 8 7* 11 11  CREATININE 0.67 0.57 0.58 0.66 0.69  CALCIUM 7.6* 7.9* 7.7* 8.1* 8.3*   Liver Function Tests: Recent Labs  Lab 03/15/20 0234 03/16/20 0143 03/17/20 0409 03/18/20 0133 03/19/20 0450  AST 36 39 31 24 28   ALT 31 36 30 24 28   ALKPHOS 29* 32* 33* 29* 36*  BILITOT 0.7 0.5 0.3 0.5 0.8  PROT 4.9* 5.1* 5.1* 5.0* 5.5*  ALBUMIN 2.7* 2.6* 2.6* 2.4* 2.8*   Recent Labs  Lab 03/13/20 2227  LIPASE 29   Recent Labs  Lab 03/14/20 0413  AMMONIA 55*   CBC: Recent Labs  Lab 03/15/20 0626 03/16/20 0143 03/17/20 0409 03/18/20 0133 03/19/20 0450  WBC  9.5 11.0* 12.5* 9.9 15.9*  NEUTROABS 6.8 7.1 8.4* 5.9 11.8*  HGB 11.5* 12.2 12.4 12.2 13.5  HCT 36.4 35.5* 37.3 36.6 40.2  MCV 95.8 91.0 90.8 91.0 90.3  PLT 139* 150 158 176 192   Cardiac Enzymes: No results for input(s): CKTOTAL, CKMB, CKMBINDEX, TROPONINI in the last 168 hours. BNP: Invalid input(s): POCBNP CBG: Recent Labs  Lab 03/18/20 0722 03/18/20 1205 03/18/20 1738 03/18/20 2203 03/19/20 0806  GLUCAP 85 115* 173* 80 104*   D-Dimer Recent Labs    03/18/20 0133 03/19/20 0450  DDIMER 0.68* 0.74*   Hgb A1c No results for input(s): HGBA1C in the last 72 hours. Lipid Profile No results for input(s): CHOL, HDL, LDLCALC, TRIG, CHOLHDL, LDLDIRECT in the last 72 hours. Thyroid function studies No results for input(s): TSH, T4TOTAL, T3FREE, THYROIDAB in the last 72 hours.  Invalid input(s): FREET3 Anemia work up Recent Labs    03/18/20 0133 03/19/20 0450  FERRITIN 157 181   Urinalysis    Component Value Date/Time   COLORURINE YELLOW 03/14/2020 [place] 03/14/2020 0838    LABSPEC >1.046 (H) 03/14/2020 0838   PHURINE 6.0 03/14/2020 0838   GLUCOSEU NEGATIVE 03/14/2020 0838   HGBUR NEGATIVE 03/14/2020 0838   BILIRUBINUR NEGATIVE 03/14/2020 0838   KETONESUR 5 (A) 03/14/2020 0838   PROTEINUR NEGATIVE 03/14/2020 0838   NITRITE POSITIVE (A) 03/14/2020 0838   LEUKOCYTESUR NEGATIVE 03/14/2020 0838   Sepsis Labs Invalid input(s): PROCALCITONIN,  WBC,  LACTICIDVEN Microbiology Recent Results (from the past 240 hour(s))  Blood culture (routine x 2)     Status: None (Preliminary result)   Collection Time: 03/13/20 10:15 PM   Specimen: BLOOD LEFT HAND  Result Value Ref Range Status   Specimen Description BLOOD LEFT HAND  Final   Special Requests   Final    BOTTLES DRAWN AEROBIC AND ANAEROBIC Blood Culture adequate volume   Culture   Final    NO GROWTH 4 DAYS Performed at [facility], [place] [address], [place], [place] [address]    Report Status PENDING  Incomplete  Resp Panel by RT-PCR (Flu A&B, Covid) Nasopharyngeal Swab     Status: Abnormal   Collection Time: 03/14/20 12:59 AM   Specimen: Nasopharyngeal Swab; Nasopharyngeal(NP) swabs in vial transport medium  Result Value Ref Range Status   SARS Coronavirus 2 by RT PCR POSITIVE (A) NEGATIVE Final    Comment: RESULT CALLED TO, READ BACK BY AND VERIFIED WITH: S WIRTZ RN 03/14/20 0305 JDW (NOTE) SARS-CoV-2 target nucleic acids are DETECTED.  The SARS-CoV-2 RNA is generally detectable in upper respiratory specimens during the acute phase of infection. Positive results are indicative of the presence of the identified virus, but do not rule out bacterial infection or co-infection with other pathogens not detected by the test. Clinical correlation with patient history and other diagnostic information is necessary to determine patient infection status. The expected result is Negative.  Fact Sheet for Patients: EntrepreneurPulse.com.au  Fact Sheet for Healthcare  Providers: IncredibleEmployment.be  This test is not yet approved or cleared by the [place] FDA and  has been authorized for detection and/or diagnosis of SARS-CoV-2 by FDA under an Emergency Use Authorization (EUA).  This EUA will remain in effect (meaning this test can be used ) for the duration of  the COVID-19 declaration under Section 564(b)(1) of the Act, 21 U.S.C. section 360bbb-3(b)(1), unless the authorization is terminated or revoked sooner.     Influenza A by PCR NEGATIVE NEGATIVE  Final   Influenza B by PCR NEGATIVE NEGATIVE Final    Comment: (NOTE) The Xpert Xpress SARS-CoV-2/FLU/RSV plus assay is intended as an aid in the diagnosis of influenza from Nasopharyngeal swab specimens and should not be used as a sole basis for treatment. Nasal washings and aspirates are unacceptable for Xpert Xpress SARS-CoV-2/FLU/RSV testing.  Fact Sheet for Patients: EntrepreneurPulse.com.au  Fact Sheet for Healthcare Providers: IncredibleEmployment.be  This test is not yet approved or cleared by the [place] FDA and has been authorized for detection and/or diagnosis of SARS-CoV-2 by FDA under an Emergency Use Authorization (EUA). This EUA will remain in effect (meaning this test can be used) for the duration of the COVID-19 declaration under Section 564(b)(1) of the Act, 21 U.S.C. section 360bbb-3(b)(1), unless the authorization is terminated or revoked.  Performed at [facility], [place] [address], [place], [place] [address]     Time coordinating discharge: Over 30 minutes  SIGNED:  Lorella Nimrod, MD  [facility] 03/19/2020, 11:11 AM  If 7PM-7AM, please contact night-coverage [email or web address]  This record has been created using Systems analyst. Errors have been sought and corrected,but may not always be located. Such creation errors do not reflect on the standard of care.

## 2020-03-19 NOTE — TOC Progression Note (Addendum)
Transition of Care [facility]) - Progression Note    Patient Details  Name: Paula Cantu MRN: [id number] Date of Birth: [birth year]  [facility] CM/SW Contact  Mearl Latin, LCSW Phone Number: 03/19/2020, 1:23 PM  Clinical Narrative:    1:23pm-CSW spoke with patient's niece, Dois Davenport, to make her aware of patient's discharge today. CSW was directed by [facility] at the group home to contact the RN: [phone]. CSW contacted that number and was directed by Tabitha to contact [facility] at [phone]. CSW left a voicemail at that number.   2:20pm-CSW received call back from [facility] with [facility]. She requested CSW fax over dc summary to f. [phone]. They will arrange home health therapy. Patient will require PTAR for transport.    Expected Discharge Plan: Home w Home Health Services Barriers to Discharge: Continued Medical Work up  Expected Discharge Plan and Services Expected Discharge Plan: Home w Home Health Services   Discharge Planning Services: CM Consult   Living arrangements for the past 2 months: Group Home Expected Discharge Date: 03/18/20                                     Social Determinants of Health (SDOH) Interventions    Readmission Risk Interventions No flowsheet data found.

## 2020-03-19 NOTE — Care Management Important Message (Signed)
Important Message  Patient Details  Name: Paula Cantu MRN: [id number] Date of Birth: 1945-10-31   Medicare Important Message Given:  Yes - Important Message mailed due to current National Emergency  Verbal consent obtained due to current National Emergency  Relationship to patient: Self Contact Name: Liyana Suniga Call Date: 03/19/20  Time: 1441 Phone: [phone] Outcome: No Answer/Busy Important Message mailed to: Patient address on file    Orson Aloe 03/19/2020, 2:42 PM

## 2020-04-25 ENCOUNTER — Emergency Department (HOSPITAL_COMMUNITY)
Admission: EM | Admit: 2020-04-25 | Discharge: 2020-04-26 | Disposition: A | Discharge status: Home / Self Care | Payer: Medicare Other | Attending: Emergency Medicine | Admitting: Emergency Medicine

## 2020-04-25 ENCOUNTER — Other Ambulatory Visit: Payer: Self-pay

## 2020-04-25 ENCOUNTER — Encounter (HOSPITAL_COMMUNITY): Payer: Self-pay

## 2020-04-25 ENCOUNTER — Emergency Department (HOSPITAL_COMMUNITY): Payer: Medicare Other

## 2020-04-25 DIAGNOSIS — N390 Urinary tract infection, site not specified: Secondary | ICD-10-CM | POA: Diagnosis not present

## 2020-04-25 DIAGNOSIS — Z8616 Personal history of COVID-19: Secondary | ICD-10-CM | POA: Diagnosis not present

## 2020-04-25 DIAGNOSIS — R000 Tachycardia, unspecified: Secondary | ICD-10-CM | POA: Diagnosis not present

## 2020-04-25 DIAGNOSIS — Z853 Personal history of malignant neoplasm of breast: Secondary | ICD-10-CM | POA: Insufficient documentation

## 2020-04-25 DIAGNOSIS — R531 Weakness: Secondary | ICD-10-CM | POA: Diagnosis present

## 2020-04-25 DIAGNOSIS — Z7984 Long term (current) use of oral hypoglycemic drugs: Secondary | ICD-10-CM | POA: Diagnosis not present

## 2020-04-25 DIAGNOSIS — E119 Type 2 diabetes mellitus without complications: Secondary | ICD-10-CM | POA: Insufficient documentation

## 2020-04-25 DIAGNOSIS — Z8542 Personal history of malignant neoplasm of other parts of uterus: Secondary | ICD-10-CM | POA: Insufficient documentation

## 2020-04-25 LAB — CBC WITH DIFFERENTIAL/PLATELET
Abs Immature Granulocytes: 0.09 10*3/uL — ABNORMAL HIGH (ref 0.00–0.07)
Basophils Absolute: 0.1 10*3/uL (ref 0.0–0.1)
Basophils Relative: 1 %
Eosinophils Absolute: 0.1 10*3/uL (ref 0.0–0.5)
Eosinophils Relative: 1 %
HCT: 40.5 % (ref 36.0–46.0)
Hemoglobin: 13.2 g/dL (ref 12.0–15.0)
Immature Granulocytes: 1 %
Lymphocytes Relative: 21 %
Lymphs Abs: 2.3 10*3/uL (ref 0.7–4.0)
MCH: 30.8 pg (ref 26.0–34.0)
MCHC: 32.6 g/dL (ref 30.0–36.0)
MCV: 94.6 fL (ref 80.0–100.0)
Monocytes Absolute: 0.7 10*3/uL (ref 0.1–1.0)
Monocytes Relative: 7 %
Neutro Abs: 7.4 10*3/uL (ref 1.7–7.7)
Neutrophils Relative %: 69 %
Platelets: 181 10*3/uL (ref 150–400)
RBC: 4.28 MIL/uL (ref 3.87–5.11)
RDW: 15 % (ref 11.5–15.5)
WBC: 10.6 10*3/uL — ABNORMAL HIGH (ref 4.0–10.5)
nRBC: 0 % (ref 0.0–0.2)

## 2020-04-25 LAB — COMPREHENSIVE METABOLIC PANEL
ALT: 16 U/L (ref 0–44)
AST: 24 U/L (ref 15–41)
Albumin: 3.3 g/dL — ABNORMAL LOW (ref 3.5–5.0)
Alkaline Phosphatase: 37 U/L — ABNORMAL LOW (ref 38–126)
Anion gap: 15 (ref 5–15)
BUN: 21 mg/dL (ref 8–23)
CO2: 19 mmol/L — ABNORMAL LOW (ref 22–32)
Calcium: 8.9 mg/dL (ref 8.9–10.3)
Chloride: 109 mmol/L (ref 98–111)
Creatinine, Ser: 0.89 mg/dL (ref 0.44–1.00)
GFR, Estimated: >60 mL/min (ref >60)
Glucose, Bld: 141 mg/dL — ABNORMAL HIGH (ref 70–99)
Potassium: 3.8 mmol/L (ref 3.5–5.1)
Sodium: 143 mmol/L (ref 135–145)
Total Bilirubin: 0.4 mg/dL (ref 0.3–1.2)
Total Protein: 6.3 g/dL — ABNORMAL LOW (ref 6.5–8.1)

## 2020-04-25 LAB — URINALYSIS, ROUTINE W REFLEX MICROSCOPIC
Bilirubin Urine: NEGATIVE
Glucose, UA: NEGATIVE mg/dL
Hgb urine dipstick: NEGATIVE
Ketones, ur: 5 mg/dL — AB
Nitrite: POSITIVE — AB
Protein, ur: NEGATIVE mg/dL
Specific Gravity, Urine: 1.02 (ref 1.005–1.030)
pH: 5 (ref 5.0–8.0)

## 2020-04-25 MED ORDER — CEPHALEXIN 500 MG PO CAPS: 500.0000 mg | ORAL_CAPSULE | Freq: Three times a day (TID) | ORAL | 0 refills | Status: AC

## 2020-04-25 MED ORDER — SODIUM CHLORIDE 0.9 % IV BOLUS
1000.0000 mL | Freq: Once | INTRAVENOUS | Status: AC
  Administered 2020-04-25: 1000 mL via INTRAVENOUS

## 2020-04-25 MED ORDER — IOHEXOL 350 MG/ML SOLN
100.0000 mL | Freq: Once | INTRAVENOUS | Status: AC | PRN
  Administered 2020-04-25: 100 mL via INTRAVENOUS

## 2020-04-25 MED ORDER — SODIUM CHLORIDE 0.9 % IV SOLN
1.0000 g | Freq: Once | INTRAVENOUS | Status: AC
  Administered 2020-04-25: 1 g via INTRAVENOUS
  Filled 2020-04-25: qty 10

## 2020-04-25 NOTE — ED Notes (Signed)
PTAR called for transport back to [facility] group home.

## 2020-04-25 NOTE — ED Notes (Signed)
Patient to CT.

## 2020-04-25 NOTE — ED Triage Notes (Signed)
PATIENT RESIDES AT [facility]. FACILITY SENDS PATIENT IN DUE TO CONSTANT HOLDING HEAD DOWN SINCE THIS AM. PATIENT NON-VERBAL AND AMBULATORY AT BASELINE. PATIENT AMBULATORY ON EMS ARRIVAL. PATIENT IS AGITATED ON ARRIVAL.

## 2020-04-25 NOTE — ED Provider Notes (Signed)
[facility] DEPT Provider Note   CSN: [id number] Arrival date & time: 04/25/20  1511     History Chief Complaint  Patient presents with  . Weakness    Paula Cantu is a 75 y.o. female past medical history of idiopathic intellectual disability, microcephaly, seizure disorder, type 2 diabetes, endometrial cancer, invasive ductal carcinoma of the breast. Patient sent in from [facility] for "possible dehydration, possible UTI, lump on back of neck."  Per EMS, patient reports facility was concerned as patient was "constantly holding her head down" since this morning.  Patient is reportedly nonverbal at baseline and is ambulatory.  EMS noted patient was ambulatory on arrival.  She was also noted to be agitated.   Per Tamika RN at nursing facility: concerned for UTI due to Today they noticed she had some trouble holding her head up with some generalized weakness. No hx of frequent UTI, normal PO intake. No vomiting or diarrhea, fever, falls, cough.   They mention she has "white coat syndrome" and some anxiety. Takes Tamoxifen daily for breast cancer treatment. Compliant with depakote, no recent seizures. Vaccinated for COVID  last year no booster, no concern for covid illness today.  The history is provided by the EMS personnel. The history is limited by a developmental delay.  LEVEL 5 CAVEAT: patient is nonverbal     Past Medical History:  Diagnosis Date  . Arthritis   . Cancer (HCC)    endometrial  . Constipation   . Diabetes mellitus without complication ([place])    takes metformin  . Frequent falls   . Gait disorder   . Incontinence of bowel   . Incontinence of urine   . Intellectual disability    severe intellectual disability, non-verbal  . Osteopenia   . Seizures ([place])    remote history; possible febrile childhood seizure    Patient Active Problem List   Diagnosis Date Noted  . Pneumonia due to COVID-19 virus 03/15/2020  . Acute hypoxemic  respiratory failure due to COVID-19 ([place]) 03/14/2020  . Sinus tachycardia 03/14/2020  . Acute encephalopathy 03/14/2020  . Malignant neoplasm of overlapping sites of left breast in female, estrogen receptor positive ([place]) 11/09/2018  . Mental disability 11/24/2017  . Falling 08/12/2017  . Idiopathic intellectual disability 04/13/2013    Past Surgical History:  Procedure Laterality Date  . ABDOMINAL HYSTERECTOMY    . BREAST LUMPECTOMY Left 03/04/2013   Procedure: LEFT BREAST LUMPECTOMY;  Surgeon: [name], MD;  Location: [facility];  Service: General;  Laterality: Left;  . RADIOLOGY WITH ANESTHESIA N/A 09/16/2016   Procedure: RADIOLOGY WITH ANESTHESIA MRI OF THE BRAIN WITH AND WITHOUT;  Surgeon: Radiologist, Medication, MD;  Location: [facility];  Service: Radiology;  Laterality: N/A;  . RADIOLOGY WITH ANESTHESIA N/A 09/15/2017   Procedure: MRI WITH ANESTHESIA OF THE BRAIN WITH AND WITHOUT AND CERVICAL WITHOUT CONTRAST;  Surgeon: Radiologist, Medication, MD;  Location: [facility];  Service: Radiology;  Laterality: N/A;     OB History   No obstetric history on file.     History reviewed. No pertinent family history.  Social History   Tobacco Use  . Smoking status: Never Smoker  . Smokeless tobacco: Never Used  Vaping Use  . Vaping Use: Never used  Substance Use Topics  . Alcohol use: No  . Drug use: No    Home Medications Prior to Admission medications   Medication Sig Start Date End Date Taking? Authorizing Provider  cephALEXin (KEFLEX) 500  MG capsule Take 1 capsule (500 mg total) by mouth 3 (three) times daily for 7 days. 04/25/20 05/02/20 Yes Isaia Hassell, [place] N, PA-C  Cholecalciferol (VITAMIN D) 2000 units tablet Take 2,000 Units by mouth daily.    [provider]  divalproex (DEPAKOTE SPRINKLES) 125 MG capsule Take 2 capsules (250 mg total) by mouth 2 (two) times daily. 11/24/17   Marcial Pacas, MD  guaiFENesin-dextromethorphan (ROBITUSSIN DM) 100-10 MG/5ML syrup Take 10 mLs by  mouth every 4 (four) hours as needed for cough. 03/19/20   Lorella Nimrod, MD  menthol-zinc oxide (GOLD BOND) powder Apply 1 application topically 2 (two) times daily. Apply under breasts    [provider]  metFORMIN (GLUCOPHAGE) 850 MG tablet Take 850 mg by mouth daily with breakfast.    [provider]  pantoprazole (PROTONIX) 40 MG tablet Take 1 tablet (40 mg total) by mouth daily at 12 noon. 03/19/20   Lorella Nimrod, MD  promethazine (PHENERGAN) 25 MG tablet Take 25 mg by mouth See admin instructions. Every 4-6 hours prn vomiting 2 or more times in 4 hours. Notify md if symptoms persist over 12 hours    [provider]  sennosides-docusate sodium (SENOKOT-S) 8.6-50 MG tablet Take 2 tablets by mouth at bedtime.    [provider]  tamoxifen (NOLVADEX) 20 MG tablet Take 20 mg by mouth daily.    [provider]  vitamin C (ASCORBIC ACID) 500 MG tablet Take 500 mg by mouth 2 (two) times daily.    [provider]    Allergies    Lactose intolerance (gi), Bacillus, and Ppd [tuberculin purified protein derivative]  Review of Systems   Review of Systems  Unable to perform ROS: Patient nonverbal    Physical Exam Updated Vital Signs BP (!) 149/100   Pulse (!) 109   Temp 98 F (36.7 C) (Oral)   Resp 19   Ht 5\' 1"  (1.549 m)   Wt 59 kg   SpO2 97%   BMI 24.58 kg/m   Physical Exam Vitals and nursing note reviewed.  Constitutional:      Appearance: She is well-developed and well-nourished.     Comments: She is actively changing posture throughout evaluation and while NT is placing her in hospital gown. She changes posture on her own without difficulty. Ranging neck spontaneously.  HENT:     Head: Normocephalic and atraumatic.  Eyes:     Conjunctiva/sclera: Conjunctivae normal.  Cardiovascular:     Rate and Rhythm: Regular rhythm. Tachycardia present.  Pulmonary:     Effort: Pulmonary effort is normal. No respiratory distress.      Breath sounds: Normal breath sounds.  Abdominal:     General: Bowel sounds are normal.     Palpations: Abdomen is soft.     Tenderness: There is no abdominal tenderness. There is no guarding or rebound.  Skin:    General: Skin is warm.  Neurological:     Mental Status: She is alert.     Comments: Spontaneously moving all extremities equally. Making eye contact. No obvious facial droop.   Psychiatric:        Mood and Affect: Mood and affect normal.        Behavior: Behavior normal.     ED Results / Procedures / Treatments   Labs (all labs ordered are listed, but only abnormal results are displayed) Labs Reviewed  CBC WITH DIFFERENTIAL/PLATELET - Abnormal; Notable for the following components:      Result Value  WBC 10.6 (*)    Abs Immature Granulocytes 0.09 (*)    All other components within normal limits  COMPREHENSIVE METABOLIC PANEL - Abnormal; Notable for the following components:   CO2 19 (*)    Glucose, Bld 141 (*)    Total Protein 6.3 (*)    Albumin 3.3 (*)    Alkaline Phosphatase 37 (*)    All other components within normal limits  URINALYSIS, ROUTINE W REFLEX MICROSCOPIC - Abnormal; Notable for the following components:   Ketones, ur 5 (*)    Nitrite POSITIVE (*)    Leukocytes,Ua SMALL (*)    Bacteria, UA MANY (*)    All other components within normal limits  URINE CULTURE    EKG EKG Interpretation  Date/Time:  Wednesday April 25 2020 15:34:39 EST Ventricular Rate:  139 PR Interval:    QRS Duration: 123 QT Interval:  310 QTC Calculation: 470 R Axis:   0 Text Interpretation: Sinus tachycardia Paired ventricular premature complexes Nonspecific intraventricular conduction delay Confirmed by Thamas Jaegers ([id number]) on 04/25/2020 5:37:46 PM   Radiology CT Angio Chest PE W/Cm &/Or Wo Cm  Result Date: 04/25/2020 CLINICAL DATA:  Recent COVID. Tachycardia, tachypnea. Shortness of breath. EXAM: CT ANGIOGRAPHY CHEST WITH CONTRAST TECHNIQUE: Multidetector CT imaging  of the chest was performed using the standard protocol during bolus administration of intravenous contrast. Multiplanar CT image reconstructions and MIPs were obtained to evaluate the vascular anatomy. CONTRAST:  137mL OMNIPAQUE IOHEXOL 350 MG/ML SOLN COMPARISON:  03/14/2020 FINDINGS: Cardiovascular: Limited study due to respiratory motion. No large or central filling defects in the pulmonary arteries to suggest pulmonary emboli. Heart is normal size. Aorta is normal caliber. Coronary artery and aortic calcifications. Mediastinum/Nodes: No mediastinal, hilar, or axillary adenopathy. Trachea and esophagus are unremarkable. Thyroid unremarkable. Lungs/Pleura: No confluent opacities or effusions. 5 mm nodule at the left lung base. Upper Abdomen: Imaging into the upper abdomen demonstrates no acute findings. Musculoskeletal: Chest wall soft tissues are unremarkable. No acute bony abnormality. Review of the MIP images confirms the above findings. IMPRESSION: Limited study due to respiratory motion. No evidence of pulmonary embolus. Coronary artery disease. 5 mm nodule at the left base. No follow-up needed if patient is low-risk. Non-contrast chest CT can be considered in 12 months if patient is high-risk. This recommendation follows the consensus statement: Guidelines for Management of Incidental Pulmonary Nodules Detected on CT Images: From the Fleischner Society 2017; Radiology 2017; 284:228-243. Aortic Atherosclerosis (ICD10-I70.0). Electronically Signed   By: Rolm Baptise M.D.   On: 04/25/2020 22:00   DG Chest Port 1 View  Result Date: 04/25/2020 CLINICAL DATA:  Weakness. EXAM: PORTABLE CHEST 1 VIEW COMPARISON:  03/15/2020 FINDINGS: The cardiomediastinal silhouette is within normal limits. The lungs are hypoinflated. There is mild elevation of the right hemidiaphragm with mild gaseous distension of the hepatic flexure of the colon which is interposed between the liver and diaphragm. No airspace consolidation,  edema, pleural effusion, pneumothorax is identified. No acute osseous abnormality is seen. IMPRESSION: No active disease. Electronically Signed   By: Logan Bores M.D.   On: 04/25/2020 16:30    Procedures Procedures   Medications Ordered in ED Medications  sodium chloride 0.9 % bolus 1,000 mL (0 mLs Intravenous Stopped 04/25/20 2037)  cefTRIAXone (ROCEPHIN) 1 g in sodium chloride 0.9 % 100 mL IVPB (0 g Intravenous Stopped 04/25/20 2123)  iohexol (OMNIPAQUE) 350 MG/ML injection 100 mL (100 mLs Intravenous Contrast Given 04/25/20 2135)    ED Course  I have reviewed  the triage vital signs and the nursing notes.  Pertinent labs & imaging results that were available during my care of the patient were reviewed by me and considered in my medical decision making (see chart for details).  Clinical Course as of 04/25/20 2238  Wed Apr 25, 2020  1928 I was directly involved in this patients medical care.  [JH]    Clinical Course User Index [JH] [place], Greggory Brandy, MD   MDM Rules/Calculators/A&P                          Patient is brought in from nursing facility with concern for possible generalized weakness today.  Facility was concerned that she was not holding her head up as much is usable however on EMS arrival patient was ambulatory about the facility.  She is nonverbal at baseline, is at her mental baseline here.  She is afebrile though is noted to be tachycardic.  She is noted to have similar tachycardia in prior visits.  Her RN at the facility also reports "whitecoat hypertension" and anxiety that is not unusual for her.  Patient has reportedly had normal p.o. intake, no vomiting or diarrhea.  On evaluation she is in no acute distress.  She is spontaneously moving all extremities without difficulties, she is also spontaneously ranging her neck and adjusting her posture while she is being dressed in a gown.  Heart and lung sounds are clear.  Abdomen is benign.  She is making eye contact when being  spoken to.  No facial droop.   Blood work revealed a blood cell count of 10.6, no anemia.  Metabolic panel with normal renal and hepatic function.  UA is nitrite positive, concerning for infection. She is treated with rocephin.  She is noted to have a persistent mild tachycardia throughout her stay.  Per chart review last admission last month for COVID-19 illness, she was noted to have similar tachycardia.  However as patient is nonverbal, she is initially tachypneic with this persistent mild tachycardia, cannot rule out PE.  This discussed with attending Dr. Almyra Free.  Decision for CTA of the chest was made, and is negative.  Incidental finding of pulmonary nodule was communicated in discharge paperwork for facility to follow.   she is a afebrile. In no acute distress. At her mental baseline per facility.  Patient is appropriate for discharge to her facility at this time w treatment for UTI.  Final Clinical Impression(s) / ED Diagnoses Final diagnoses:  Acute UTI (urinary tract infection)    Rx / DC Orders ED Discharge Orders         Ordered    cephALEXin (KEFLEX) 500 MG capsule  3 times daily        04/25/20 2218           Rita Vialpando, [place] N, PA-C 04/25/20 2240    Luna Fuse, MD 04/27/20 2134

## 2020-04-25 NOTE — Discharge Instructions (Addendum)
Her blood work is overall reassuring. Her chest xray and CT are negative for pneumonia or blood blot. Her urine shows mild signs of infection. Please give her the antibiotic as directed until gone.  Her CT showed an incidental finding of a 5 mm nodule at the base of her left lung.  Discussed this finding with her primary care. Follow with her PCP.

## 2020-04-28 LAB — URINE CULTURE: Culture: >=100000 — AB

## 2020-04-29 ENCOUNTER — Telehealth (HOSPITAL_BASED_OUTPATIENT_CLINIC_OR_DEPARTMENT_OTHER): Payer: Self-pay | Admitting: Emergency Medicine

## 2020-04-29 NOTE — Telephone Encounter (Signed)
Post ED Visit - Positive Culture Follow-up  Culture report reviewed by antimicrobial stewardship pharmacist: [facility] Team []  Elenor Quinones, Pharm.D. []  Heide Guile, Pharm.D., BCPS AQ-ID []  Parks Neptune, Pharm.D., BCPS []  Alycia Rossetti, Pharm.D., BCPS []  [place], Pharm.D., BCPS, AAHIVP []  Legrand Como, Pharm.D., BCPS, AAHIVP []  Salome Arnt, PharmD, BCPS []  Johnnette Gourd, PharmD, BCPS []  Hughes Better, PharmD, BCPS []  Leeroy Cha, PharmD []  Laqueta Linden, PharmD, BCPS []  Albertina Parr, PharmD  [facility] Team []  Leodis Sias, PharmD []  Lindell Spar, PharmD []  Royetta Asal, PharmD [x]  Graylin Shiver, Rph []  Rema Fendt) Glennon Mac, PharmD []  Arlyn Dunning, PharmD []  Netta Cedars, PharmD []  Dia Sitter, PharmD []  Leone Haven, PharmD []  Gretta Arab, PharmD []  Theodis Shove, PharmD []  Peggyann Juba, PharmD []  Reuel Boom, PharmD   Positive urine culture Treated with Cephalexin, organism sensitive to the same and no further patient follow-up is required at this time.  Sandi Raveling Kenyah Luba 04/29/2020, 10:03 AM

## 2020-05-23 ENCOUNTER — Telehealth: Payer: Self-pay | Admitting: Oncology

## 2020-05-23 NOTE — Telephone Encounter (Signed)
Attempted to contact patient about scheduling an appointments, numbers on file do not work, or are the wrong number.  Will mail out schedule appointment.

## 2020-06-19 ENCOUNTER — Other Ambulatory Visit: Payer: Self-pay | Admitting: Family Medicine

## 2020-07-16 NOTE — Progress Notes (Signed)
[facility]  [phone] [phone]   ID: [name] OB: 12-15-1945  MR#: [id number]  TOI#:[id number]  Patient Care Team: Trinidad Curet as PCP - General (Family Medicine) Oasis Goehring, Virgie Dad, MD as Consulting Physician (Oncology) OTHER MD: Cephas Darby   CHIEF COMPLAINT: Estrogen receptor positive breast cancer  CURRENT TREATMENT: Tamoxifen   INTERVAL HISTORY: [name] returns today for follow-up of her left-sided breast cancer.  She is accompanied by 2 of her caregivers, Jeb Levering and Irven Coe  She continues on tamoxifen.  The staff is not aware of any side effects from this medication that they know of   REVIEW OF SYSTEMS: According to the staff [name] is very stable.  She does puzzles and colors most days.  She has an excellent appetite.  She had a bowel movement during our visit today.  A detailed review of systems was otherwise noncontributory   COVID 19 VACCINATION STATUS: infection 02/2020   BREAST CANCER HISTORY: From the original intake note:  Patient is nonverbal. According to nodes in the staff, a left breast mass was noted during a mass and this was brought to her primary physician's attention, who referred the patient to Dr. Lynwood Dawley. He saw the patient's on 01/07/2013 and noted a 5 cm mass in the superior aspect of the left breast, which seemed relatively fixed. There was no erythema. Very limited ultrasound was obtained and showed this to be solid. There was no evidence of supraclavicular adenopathy.  Needle biopsy was suggested but was not allowed by the patient. Mammography could not be obtained. Dr. [name] spoke with the patient's guardian, Abelardo Cantu, who lives in [place], and discuss the situation with her. Approval was obtained for surgery and this was performed 03/04/2013. The final pathology ([facility] [id number]) showed an invasive ductal carcinoma, grade 3, measuring 4 cm, with negative margins  (closest 2 mm) estrogen receptor positive at 91%, progesterone receptor positive at 12%, both with strong staining intensity, and a proliferation marker of 19%.  There is a question whether the patient may have received tamoxifen and/or Megace in the past, but I do not have any record regarding that.  The patient's subsequent history is as detailed below   PAST MEDICAL HISTORY: Past Medical History:  Diagnosis Date  . Arthritis   . Cancer (HCC)    endometrial  . Constipation   . Diabetes mellitus without complication ([place])    takes metformin  . Frequent falls   . Gait disorder   . Incontinence of bowel   . Incontinence of urine   . Intellectual disability    severe intellectual disability, non-verbal  . Osteopenia   . Seizures ([place])    remote history; possible febrile childhood seizure    PAST SURGICAL HISTORY: Past Surgical History:  Procedure Laterality Date  . ABDOMINAL HYSTERECTOMY    . BREAST LUMPECTOMY Left 03/04/2013   Procedure: LEFT BREAST LUMPECTOMY;  Surgeon: [name], MD;  Location: [facility];  Service: General;  Laterality: Left;  . RADIOLOGY WITH ANESTHESIA N/A 09/16/2016   Procedure: RADIOLOGY WITH ANESTHESIA MRI OF THE BRAIN WITH AND WITHOUT;  Surgeon: Radiologist, Medication, MD;  Location: [facility];  Service: Radiology;  Laterality: N/A;  . RADIOLOGY WITH ANESTHESIA N/A 09/15/2017   Procedure: MRI WITH ANESTHESIA OF THE BRAIN WITH AND WITHOUT AND CERVICAL WITHOUT CONTRAST;  Surgeon: Radiologist, Medication, MD;  Location: [facility];  Service: Radiology;  Laterality: N/A;    FAMILY HISTORY No family history on  file.   GYNECOLOGIC HISTORY:  Status post hysterectomy   SOCIAL HISTORY:  The patient lives in a group home.     ADVANCED DIRECTIVES: Her legal caregiver, Abelardo Cantu, lives in [place]. She can be reached at [phone]. She has agreed to oral  antiestrogen treatment for this patient.   HEALTH MAINTENANCE: Social History   Tobacco Use  .  Smoking status: Never Smoker  . Smokeless tobacco: Never Used  Vaping Use  . Vaping Use: Never used  Substance Use Topics  . Alcohol use: No  . Drug use: No       Allergies  Allergen Reactions  . Lactose Intolerance (Gi) Other (See Comments)    On nursing home records  . Bacillus Other (See Comments)    UNSPECIFIED REACTION   . Ppd [Tuberculin Purified Protein Derivative] Other (See Comments)    UNSPECIFIED REACTION  On nursing home records    Current Outpatient Medications  Medication Sig Dispense Refill  . Cholecalciferol (VITAMIN D) 2000 units tablet Take 2,000 Units by mouth daily.    . divalproex (DEPAKOTE SPRINKLES) 125 MG capsule Take 2 capsules (250 mg total) by mouth 2 (two) times daily. 120 capsule 11  . guaiFENesin-dextromethorphan (ROBITUSSIN DM) 100-10 MG/5ML syrup Take 10 mLs by mouth every 4 (four) hours as needed for cough. 118 mL 0  . menthol-zinc oxide (GOLD BOND) powder Apply 1 application topically 2 (two) times daily. Apply under breasts    . metFORMIN (GLUCOPHAGE) 850 MG tablet Take 850 mg by mouth daily with breakfast.    . pantoprazole (PROTONIX) 40 MG tablet Take 1 tablet (40 mg total) by mouth daily at 12 noon. 30 tablet 0  . promethazine (PHENERGAN) 25 MG tablet Take 25 mg by mouth See admin instructions. Every 4-6 hours prn vomiting 2 or more times in 4 hours. Notify md if symptoms persist over 12 hours    . sennosides-docusate sodium (SENOKOT-S) 8.6-50 MG tablet Take 2 tablets by mouth at bedtime.    . tamoxifen (NOLVADEX) 20 MG tablet Take 20 mg by mouth daily.    . vitamin C (ASCORBIC ACID) 500 MG tablet Take 500 mg by mouth 2 (two) times daily.     No current facility-administered medications for this visit.    OBJECTIVE: Middle-aged white woman examined in a wheelchair.  The patient spontaneously stood up in the middle of the exam. Vitals:   07/17/20 0915  Temp: 97.7 F (36.5 C)     Body mass index is 23.81 kg/m.    Staff was unable to  obtain vitals today as patient was not cooperative  Sclerae unicteric Lungs no rales or rhonchi Heart regular rate and rhythm Abd soft, nontender MSK no focal spinal tenderness Neuro: nonfocal Breasts: There is an apparent mass in the right breast measuring approximately 5 cm, movable, nontender, not associated with skin or nipple changes.  The left breast is status post lumpectomy.  There are no palpable masses or evidence of left breast recurrence.   Left breast 11/09/2018    LAB RESULTS:  CMP     Component Value Date/Time   NA 143 04/25/2020 1536    No results found for: SPEP  Lab Results  Component Value Date   WBC 10.6 (H) 04/25/2020      Chemistry      Component Value Date/Time   NA 143 04/25/2020 1536      Component Value Date/Time   CALCIUM 8.9 04/25/2020 1536       No results  found for: LABCA2  No components found for: MVEHM094  No results for input(s): INR in the last 168 hours.  Urinalysis    Component Value Date/Time   COLORURINE YELLOW 04/25/2020 1536    STUDIES: No results found.    ASSESSMENT: 75 [facility].o. [facility] resident status post left lumpectomy with no sentinel lymph node sampling 03/04/2013 for a pT2 pNX, stage II invasive ductal carcinoma, grade 3, estrogen receptor 91% positive, progesterone receptor 12% positive, with an MIB-1 of 19%.  (1) tamoxifen started 04/13/2013   PLAN: Munirah is now 7-1/2 years out from definitive surgery for her breast cancer with no evidence of disease recurrence.  This is very favorable.  The left breast looks fine.  I palpating an area of firmness in the right breast which requires evaluation.  I am setting her up for mammography with a tentative date of May 17.  I am suggesting that the patient may benefit from receiving Valium 5mg  on the way to that test to make sure she is compliant as mammography is uncomfortable.  I am also putting in an order for an ultrasound of the right breast and  hopefully that study may be accomplished even if the patient is moderately noncompliant.  Otherwise I am setting her up for a return appointment here in a year and we are continuing tamoxifen for a minimum of 10 years.  Total encounter time 25 minutes.* Samvel Zinn, Virgie Dad, MD  07/17/20 9:23 AM Medical Oncology and Hematology [facility] [place], [place] [address] Tel. [phone]    Fax. [phone]   I, Wilburn Mylar, am acting as scribe for Dr. Virgie Dad. Pedram Goodchild.  I, Lurline Del MD, have reviewed the above documentation for accuracy and completeness, and I agree with the above.   *Total Encounter Time as defined by the Centers for Medicare and Medicaid Services includes, in addition to the face-to-face time of a patient visit (documented in the note above) non-face-to-face time: obtaining and reviewing outside history, ordering and reviewing medications, tests or procedures, care coordination (communications with other health care professionals or caregivers) and documentation in the medical record.

## 2020-07-17 ENCOUNTER — Other Ambulatory Visit: Payer: Self-pay

## 2020-07-17 ENCOUNTER — Inpatient Hospital Stay: Payer: Medicare Other | Attending: Oncology | Admitting: Oncology

## 2020-07-17 VITALS — Temp 97.7°F | Ht 61.0 in | Wt 126.0 lb

## 2020-07-17 DIAGNOSIS — C50912 Malignant neoplasm of unspecified site of left female breast: Secondary | ICD-10-CM | POA: Insufficient documentation

## 2020-07-17 DIAGNOSIS — C50812 Malignant neoplasm of overlapping sites of left female breast: Secondary | ICD-10-CM | POA: Diagnosis not present

## 2020-07-17 DIAGNOSIS — Z170 Estrogen receptor positive status [ER+]: Secondary | ICD-10-CM | POA: Diagnosis not present

## 2020-07-17 DIAGNOSIS — F79 Unspecified intellectual disabilities: Secondary | ICD-10-CM

## 2020-07-17 DIAGNOSIS — Z79810 Long term (current) use of selective estrogen receptor modulators (SERMs): Secondary | ICD-10-CM | POA: Insufficient documentation

## 2020-09-21 ENCOUNTER — Emergency Department (HOSPITAL_COMMUNITY): Payer: Medicare Other

## 2020-09-21 ENCOUNTER — Inpatient Hospital Stay (HOSPITAL_COMMUNITY)
Admission: EM | Admit: 2020-09-21 | Discharge: 2020-09-27 | DRG: 597 | Disposition: A | Discharge status: Home / Self Care | Payer: Medicare Other | Attending: Internal Medicine | Admitting: Internal Medicine

## 2020-09-21 ENCOUNTER — Other Ambulatory Visit: Payer: Self-pay

## 2020-09-21 ENCOUNTER — Encounter (HOSPITAL_COMMUNITY): Payer: Self-pay

## 2020-09-21 DIAGNOSIS — Z170 Estrogen receptor positive status [ER+]: Secondary | ICD-10-CM

## 2020-09-21 DIAGNOSIS — E739 Lactose intolerance, unspecified: Secondary | ICD-10-CM | POA: Diagnosis present

## 2020-09-21 DIAGNOSIS — C50812 Malignant neoplasm of overlapping sites of left female breast: Secondary | ICD-10-CM | POA: Diagnosis not present

## 2020-09-21 DIAGNOSIS — C50911 Malignant neoplasm of unspecified site of right female breast: Secondary | ICD-10-CM | POA: Diagnosis present

## 2020-09-21 DIAGNOSIS — Z888 Allergy status to other drugs, medicaments and biological substances status: Secondary | ICD-10-CM | POA: Diagnosis not present

## 2020-09-21 DIAGNOSIS — Z87440 Personal history of urinary (tract) infections: Secondary | ICD-10-CM

## 2020-09-21 DIAGNOSIS — J910 Malignant pleural effusion: Secondary | ICD-10-CM | POA: Diagnosis present

## 2020-09-21 DIAGNOSIS — Z7984 Long term (current) use of oral hypoglycemic drugs: Secondary | ICD-10-CM | POA: Diagnosis not present

## 2020-09-21 DIAGNOSIS — R451 Restlessness and agitation: Secondary | ICD-10-CM | POA: Diagnosis present

## 2020-09-21 DIAGNOSIS — F72 Severe intellectual disabilities: Secondary | ICD-10-CM | POA: Diagnosis present

## 2020-09-21 DIAGNOSIS — Z20822 Contact with and (suspected) exposure to covid-19: Secondary | ICD-10-CM | POA: Diagnosis present

## 2020-09-21 DIAGNOSIS — C7931 Secondary malignant neoplasm of brain: Secondary | ICD-10-CM | POA: Diagnosis present

## 2020-09-21 DIAGNOSIS — R000 Tachycardia, unspecified: Secondary | ICD-10-CM | POA: Diagnosis present

## 2020-09-21 DIAGNOSIS — N390 Urinary tract infection, site not specified: Secondary | ICD-10-CM | POA: Diagnosis present

## 2020-09-21 DIAGNOSIS — Z66 Do not resuscitate: Secondary | ICD-10-CM | POA: Diagnosis not present

## 2020-09-21 DIAGNOSIS — C50912 Malignant neoplasm of unspecified site of left female breast: Secondary | ICD-10-CM | POA: Diagnosis present

## 2020-09-21 DIAGNOSIS — Z90710 Acquired absence of both cervix and uterus: Secondary | ICD-10-CM | POA: Diagnosis not present

## 2020-09-21 DIAGNOSIS — F419 Anxiety disorder, unspecified: Secondary | ICD-10-CM | POA: Diagnosis present

## 2020-09-21 DIAGNOSIS — J90 Pleural effusion, not elsewhere classified: Secondary | ICD-10-CM | POA: Diagnosis present

## 2020-09-21 DIAGNOSIS — B9620 Unspecified Escherichia coli [E. coli] as the cause of diseases classified elsewhere: Secondary | ICD-10-CM | POA: Diagnosis present

## 2020-09-21 DIAGNOSIS — M8580 Other specified disorders of bone density and structure, unspecified site: Secondary | ICD-10-CM | POA: Diagnosis present

## 2020-09-21 DIAGNOSIS — Z79811 Long term (current) use of aromatase inhibitors: Secondary | ICD-10-CM | POA: Diagnosis not present

## 2020-09-21 DIAGNOSIS — Z79810 Long term (current) use of selective estrogen receptor modulators (SERMs): Secondary | ICD-10-CM

## 2020-09-21 DIAGNOSIS — J9601 Acute respiratory failure with hypoxia: Secondary | ICD-10-CM

## 2020-09-21 DIAGNOSIS — E119 Type 2 diabetes mellitus without complications: Secondary | ICD-10-CM | POA: Diagnosis present

## 2020-09-21 DIAGNOSIS — Z79899 Other long term (current) drug therapy: Secondary | ICD-10-CM

## 2020-09-21 DIAGNOSIS — Z9109 Other allergy status, other than to drugs and biological substances: Secondary | ICD-10-CM | POA: Diagnosis not present

## 2020-09-21 DIAGNOSIS — J9690 Respiratory failure, unspecified, unspecified whether with hypoxia or hypercapnia: Secondary | ICD-10-CM | POA: Diagnosis not present

## 2020-09-21 DIAGNOSIS — C7800 Secondary malignant neoplasm of unspecified lung: Secondary | ICD-10-CM | POA: Diagnosis present

## 2020-09-21 DIAGNOSIS — Z515 Encounter for palliative care: Secondary | ICD-10-CM | POA: Diagnosis not present

## 2020-09-21 DIAGNOSIS — Z7189 Other specified counseling: Secondary | ICD-10-CM | POA: Diagnosis not present

## 2020-09-21 LAB — URINALYSIS, ROUTINE W REFLEX MICROSCOPIC
Bilirubin Urine: NEGATIVE
Glucose, UA: NEGATIVE mg/dL
Hgb urine dipstick: NEGATIVE
Ketones, ur: 5 mg/dL — AB
Leukocytes,Ua: NEGATIVE
Nitrite: NEGATIVE
Protein, ur: NEGATIVE mg/dL
Specific Gravity, Urine: 1.006 (ref 1.005–1.030)
pH: 6 (ref 5.0–8.0)

## 2020-09-21 LAB — CBC WITH DIFFERENTIAL/PLATELET
Abs Immature Granulocytes: 0.04 10*3/uL (ref 0.00–0.07)
Basophils Absolute: 0 10*3/uL (ref 0.0–0.1)
Basophils Relative: 0 %
Eosinophils Absolute: 0 10*3/uL (ref 0.0–0.5)
Eosinophils Relative: 0 %
HCT: 36.7 % (ref 36.0–46.0)
Hemoglobin: 11.8 g/dL — ABNORMAL LOW (ref 12.0–15.0)
Immature Granulocytes: 0 %
Lymphocytes Relative: 15 %
Lymphs Abs: 1.5 10*3/uL (ref 0.7–4.0)
MCH: 30.5 pg (ref 26.0–34.0)
MCHC: 32.2 g/dL (ref 30.0–36.0)
MCV: 94.8 fL (ref 80.0–100.0)
Monocytes Absolute: 0.7 10*3/uL (ref 0.1–1.0)
Monocytes Relative: 7 %
Neutro Abs: 8.1 10*3/uL — ABNORMAL HIGH (ref 1.7–7.7)
Neutrophils Relative %: 78 %
Platelets: 217 10*3/uL (ref 150–400)
RBC: 3.87 MIL/uL (ref 3.87–5.11)
RDW: 13.9 % (ref 11.5–15.5)
WBC: 10.4 10*3/uL (ref 4.0–10.5)
nRBC: 0 % (ref 0.0–0.2)

## 2020-09-21 LAB — LACTIC ACID, PLASMA: Lactic Acid, Venous: 1.4 mmol/L (ref 0.5–1.9)

## 2020-09-21 LAB — COMPREHENSIVE METABOLIC PANEL
ALT: 12 U/L (ref 0–44)
AST: 17 U/L (ref 15–41)
Albumin: 3 g/dL — ABNORMAL LOW (ref 3.5–5.0)
Alkaline Phosphatase: 33 U/L — ABNORMAL LOW (ref 38–126)
Anion gap: 6 (ref 5–15)
BUN: 11 mg/dL (ref 8–23)
CO2: 23 mmol/L (ref 22–32)
Calcium: 8.1 mg/dL — ABNORMAL LOW (ref 8.9–10.3)
Chloride: 110 mmol/L (ref 98–111)
Creatinine, Ser: 0.59 mg/dL (ref 0.44–1.00)
GFR, Estimated: >60 mL/min (ref >60)
Glucose, Bld: 112 mg/dL — ABNORMAL HIGH (ref 70–99)
Potassium: 3.8 mmol/L (ref 3.5–5.1)
Sodium: 139 mmol/L (ref 135–145)
Total Bilirubin: 0.5 mg/dL (ref 0.3–1.2)
Total Protein: 5.7 g/dL — ABNORMAL LOW (ref 6.5–8.1)

## 2020-09-21 LAB — LIPASE, BLOOD: Lipase: 32 U/L (ref 11–51)

## 2020-09-21 LAB — RESP PANEL BY RT-PCR (FLU A&B, COVID) ARPGX2
Influenza A by PCR: NEGATIVE
Influenza B by PCR: NEGATIVE
SARS Coronavirus 2 by RT PCR: NEGATIVE

## 2020-09-21 LAB — TROPONIN I (HIGH SENSITIVITY)
Troponin I (High Sensitivity): 3 ng/L (ref <18)
Troponin I (High Sensitivity): 4 ng/L (ref <18)

## 2020-09-21 LAB — CBG MONITORING, ED: Glucose-Capillary: 114 mg/dL — ABNORMAL HIGH (ref 70–99)

## 2020-09-21 MED ORDER — ALBUTEROL SULFATE HFA 108 (90 BASE) MCG/ACT IN AERS: 2.0000 | INHALATION_SPRAY | Freq: Once | RESPIRATORY_TRACT | Status: DC

## 2020-09-21 MED ORDER — SODIUM CHLORIDE 0.9 % IV BOLUS
1000.0000 mL | Freq: Once | INTRAVENOUS | Status: AC
  Administered 2020-09-21: 1000 mL via INTRAVENOUS

## 2020-09-21 MED ORDER — IOHEXOL 350 MG/ML SOLN
100.0000 mL | Freq: Once | INTRAVENOUS | Status: AC | PRN
  Administered 2020-09-21: 100 mL via INTRAVENOUS

## 2020-09-21 MED ORDER — LORAZEPAM 2 MG/ML IJ SOLN
1.0000 mg | Freq: Once | INTRAMUSCULAR | Status: AC
  Administered 2020-09-21: 1 mg via INTRAMUSCULAR

## 2020-09-21 MED ORDER — LORAZEPAM 2 MG/ML IJ SOLN
1.0000 mg | Freq: Once | INTRAMUSCULAR | Status: DC
  Filled 2020-09-21: qty 1

## 2020-09-21 NOTE — ED Notes (Signed)
Lab called regarding blood not being in process. Lab states they have received the blood.

## 2020-09-21 NOTE — ED Triage Notes (Signed)
Pt BIB [facility] from [facility] [place]. [address] Per EMS, pt has experienced urinary frequency and fowl odor. Pt has required more assistance from staff over the past few days. Pt is non-verbal.

## 2020-09-21 NOTE — ED Provider Notes (Signed)
[place] DEPT Provider Note   CSN: [id number] Arrival date & time: 09/21/20  1541     History Chief Complaint  Patient presents with   Urinary Frequency    Paula Cantu is a 75 y.o. female with medical history significant for diabetes, seizures, breast cancer, intellectual disability, nonverbal at baseline who presents for evaluation of weakness and foul-smelling urine.  Level 5 caveat- Nonverbal  Per caregiver in room>>  Urinary frequency and foul smell to urine. Hx of frequent UTI, Generalized weakness and has required more assistance over the last few days.  Dry cough, breathing harder, decreased appetite.   Patient at baseline mentation  HPI     Past Medical History:  Diagnosis Date   Arthritis    Cancer ([place])    endometrial   Constipation    Diabetes mellitus without complication ([place])    takes metformin   Frequent falls    Gait disorder    Incontinence of bowel    Incontinence of urine    Intellectual disability    severe intellectual disability, non-verbal   Osteopenia    Seizures ([place])    remote history; possible febrile childhood seizure    Patient Active Problem List   Diagnosis Date Noted   Pleural effusion 09/21/2020   Pneumonia due to COVID-19 virus 03/15/2020   Acute hypoxemic respiratory failure due to COVID-19 [facility]) 03/14/2020   Sinus tachycardia 03/14/2020   Acute encephalopathy 03/14/2020   Malignant neoplasm of overlapping sites of left breast in female, estrogen receptor positive ([place]) 11/09/2018   Mental disability 11/24/2017   Falling 08/12/2017   Idiopathic intellectual disability 04/13/2013    Past Surgical History:  Procedure Laterality Date   ABDOMINAL HYSTERECTOMY     BREAST LUMPECTOMY Left 03/04/2013   Procedure: LEFT BREAST LUMPECTOMY;  Surgeon: [name], MD;  Location: [facility];  Service: General;  Laterality: Left;   RADIOLOGY WITH ANESTHESIA N/A 09/16/2016   Procedure: RADIOLOGY  WITH ANESTHESIA MRI OF THE BRAIN WITH AND WITHOUT;  Surgeon: Radiologist, Medication, MD;  Location: [facility];  Service: Radiology;  Laterality: N/A;   RADIOLOGY WITH ANESTHESIA N/A 09/15/2017   Procedure: MRI WITH ANESTHESIA OF THE BRAIN WITH AND WITHOUT AND CERVICAL WITHOUT CONTRAST;  Surgeon: Radiologist, Medication, MD;  Location: [facility];  Service: Radiology;  Laterality: N/A;     OB History   No obstetric history on file.     History reviewed. No pertinent family history.  Social History   Tobacco Use   Smoking status: Never   Smokeless tobacco: Never  Vaping Use   Vaping Use: Never used  Substance Use Topics   Alcohol use: No   Drug use: No    Home Medications Prior to Admission medications   Medication Sig Start Date End Date Taking? Authorizing Provider  Cholecalciferol (VITAMIN D) 2000 units tablet Take 2,000 Units by mouth daily.    [provider]  divalproex (DEPAKOTE SPRINKLES) 125 MG capsule Take 2 capsules (250 mg total) by mouth 2 (two) times daily. 11/24/17   Marcial Pacas, MD  guaiFENesin-dextromethorphan (ROBITUSSIN DM) 100-10 MG/5ML syrup Take 10 mLs by mouth every 4 (four) hours as needed for cough. 03/19/20   Lorella Nimrod, MD  menthol-zinc oxide (GOLD BOND) powder Apply 1 application topically 2 (two) times daily. Apply under breasts    [provider]  metFORMIN (GLUCOPHAGE) 850 MG tablet Take 850 mg by mouth daily with breakfast.    [provider]  pantoprazole (PROTONIX) 40  MG tablet Take 1 tablet (40 mg total) by mouth daily at 12 noon. 03/19/20   Lorella Nimrod, MD  promethazine (PHENERGAN) 25 MG tablet Take 25 mg by mouth See admin instructions. Every 4-6 hours prn vomiting 2 or more times in 4 hours. Notify md if symptoms persist over 12 hours    [provider]  sennosides-docusate sodium (SENOKOT-S) 8.6-50 MG tablet Take 2 tablets by mouth at bedtime.    [provider]  tamoxifen (NOLVADEX) 20 MG tablet Take 20 mg by  mouth daily.    [provider]  vitamin C (ASCORBIC ACID) 500 MG tablet Take 500 mg by mouth 2 (two) times daily.    [provider]    Allergies    Lactose intolerance (gi), Bacillus, and Ppd [tuberculin purified protein derivative]  Review of Systems   Review of Systems  Unable to perform ROS: Patient nonverbal   Physical Exam Updated Vital Signs BP (!) 149/74   Pulse (!) 109   Temp (!) 96.9 F (36.1 C) (Axillary)   Resp (!) 23   Ht 4' (1.219 m)   Wt 47.2 kg   SpO2 98%   BMI 31.74 kg/m   Physical Exam Vitals and nursing note reviewed.  Constitutional:      General: She is not in acute distress.    Appearance: She is well-developed. She is ill-appearing (Chronically ill appearing). She is not toxic-appearing or diaphoretic.  HENT:     Head: Normocephalic and atraumatic.     Nose: Nose normal.     Mouth/Throat:     Mouth: Mucous membranes are moist.  Eyes:     Pupils: Pupils are equal, round, and reactive to light.  Cardiovascular:     Rate and Rhythm: Normal rate.     Pulses: Normal pulses.     Heart sounds: Normal heart sounds.  Pulmonary:     Comments: Tachypnea, coarse lung sounds with decreased lung sounds on right Abdominal:     General: Bowel sounds are normal. There is no distension.     Palpations: Abdomen is soft.     Tenderness: There is no abdominal tenderness. There is no guarding or rebound.     Comments: Soft, nontender  Musculoskeletal:        General: Normal range of motion.     Cervical back: Normal range of motion.     Comments: Moves all 4 extremities.  No bony tenderness  Skin:    General: Skin is warm and dry.     Comments: Mild skin breakdown to perineal region however no warmth, fluctuance, induration or rash  Neurological:     Mental Status: She is alert. Mental status is at baseline.     Comments: At baseline per caregiver  Psychiatric:        Mood and Affect: Mood normal.    ED Results / Procedures / Treatments    Labs (all labs ordered are listed, but only abnormal results are displayed) Labs Reviewed  CBC WITH DIFFERENTIAL/PLATELET - Abnormal; Notable for the following components:      Result Value   Hemoglobin 11.8 (*)    Neutro Abs 8.1 (*)    All other components within normal limits  COMPREHENSIVE METABOLIC PANEL - Abnormal; Notable for the following components:   Glucose, Bld 112 (*)    Calcium 8.1 (*)    Total Protein 5.7 (*)    Albumin 3.0 (*)    Alkaline Phosphatase 33 (*)    All other components  within normal limits  URINALYSIS, ROUTINE W REFLEX MICROSCOPIC - Abnormal; Notable for the following components:   Color, Urine STRAW (*)    Ketones, ur 5 (*)    All other components within normal limits  CBG MONITORING, ED - Abnormal; Notable for the following components:   Glucose-Capillary 114 (*)    All other components within normal limits  RESP PANEL BY RT-PCR (FLU A&B, COVID) ARPGX2  CULTURE, BLOOD (ROUTINE X 2)  CULTURE, BLOOD (ROUTINE X 2)  URINE CULTURE  LIPASE, BLOOD  LACTIC ACID, PLASMA  LACTIC ACID, PLASMA  TROPONIN I (HIGH SENSITIVITY)  TROPONIN I (HIGH SENSITIVITY)    EKG EKG Interpretation  Date/Time:  Friday September 21 2020 16:33:18 EDT Ventricular Rate:  134 PR Interval:  112 QRS Duration: 97 QT Interval:  307 QTC Calculation: 457 R Axis:   -10 Text Interpretation: Sinus tachycardia Ventricular premature complex Sinus tachycardia Confirmed by Lavenia Atlas [phone]) on 09/21/2020 4:57:25 PM  Radiology CT Angio Chest PE W/Cm &/Or Wo Cm  Result Date: 09/21/2020 CLINICAL DATA:  Chest and abdominal pain with foul-smelling urine, initial encounter EXAM: CT ANGIOGRAPHY CHEST CT ABDOMEN AND PELVIS WITH CONTRAST TECHNIQUE: Multidetector CT imaging of the chest was performed using the standard protocol during bolus administration of intravenous contrast. Multiplanar CT image reconstructions and MIPs were obtained to evaluate the vascular anatomy. Multidetector CT imaging  of the abdomen and pelvis was performed using the standard protocol during bolus administration of intravenous contrast. CONTRAST:  171mL OMNIPAQUE IOHEXOL 350 MG/ML SOLN COMPARISON:  Chest x-ray from earlier in the same day, CT from 04/25/2020 FINDINGS: CTA CHEST FINDINGS Cardiovascular: Thoracic aorta demonstrates mild atherosclerotic calcifications. No aneurysmal dilatation or dissection is noted. The heart is not significantly enlarged in size. The pulmonary artery shows a normal branching pattern bilaterally. No filling defect to suggest pulmonary embolism is seen. Coronary calcifications are noted. Mediastinum/Nodes: Thoracic inlet is within normal limits. Scattered noncalcified hilar lymph nodes are noted bilaterally. The largest of these on the left measures approximately 12 mm in short axis. Largest of these on the right measures approximately 11 mm in short axis. A few calcified lymph nodes are noted within the right hilum likely related to prior granulomatous disease. The esophagus as visualized appears within normal limits. Lungs/Pleura: Large right-sided pleural effusion is noted. The residual right lung is well aerated with scattered pulmonary nodules identified. The largest of these lies in the right upper lobe best seen on image number 44 of series 4 measuring approximately 7 mm. This was not well appreciated on the recent exam from February of this year. Consolidation in the right lower lobe is noted related to the underlying effusion. Multiple nodular densities are noted throughout the left lower lobe which have increased in the interval from the prior exam. The largest of these is noted on image number 68 of series 4. This nodule was not present on the prior exam. No sizable effusion on the left is noted. Scattered smaller left-sided nodules are seen. The previously seen lower lobe nodule has increased in size now measuring approximately 10 mm in dimension. Musculoskeletal: Degenerative changes of  the thoracic spine are noted. No discrete lytic or sclerotic lesions are seen. There is however a soft tissue mass lesion in the right breast which was not well appreciated on the prior exam due to the imaging technique. This measures approximately 7.0 x 4.5 cm in greatest dimension. Delayed images demonstrates some peripheral enhancement particularly inferiorly with central necrosis. This is suspicious for  underlying breast neoplasm until proven otherwise. No sizable associated adenopathy is noted. Review of the MIP images confirms the above findings. CT ABDOMEN and PELVIS FINDINGS Hepatobiliary: Liver is well visualized and mildly fatty infiltrated. No focal mass is seen. Changes of cholelithiasis are seen without complicating factors. Pancreas: Unremarkable. No pancreatic ductal dilatation or surrounding inflammatory changes. Spleen: Normal in size without focal abnormality. Adrenals/Urinary Tract: Adrenal glands are within normal limits. Kidneys demonstrate a normal enhancement pattern bilaterally. No renal calculi or obstructive changes are seen. The ureters are within normal limits. The bladder is well distended. Stomach/Bowel: Colon shows no obstructive or inflammatory changes. Visualized small bowel is within normal limits. The appendix is not well appreciated. No inflammatory changes to suggest appendicitis are noted. Small bowel and stomach are unremarkable. Vascular/Lymphatic: Aortic atherosclerosis. No enlarged abdominal or pelvic lymph nodes. Reproductive: Status post hysterectomy. No adnexal masses. Other: No abdominal wall hernia or abnormality. No abdominopelvic ascites. Musculoskeletal: Compression deformities of T11 and L2 are seen. These appear chronic in nature. No lytic or sclerotic lesion is seen. Review of the MIP images confirms the above findings. IMPRESSION: CTA of the chest: No evidence of pulmonary emboli. Large right-sided pleural effusion. Multiple pulmonary nodules are noted  bilaterally suspicious for metastatic disease. These have increased in both size and number when compare with the prior exam of February of 2022. Some associated hilar adenopathy is noted. Large right breast mass with peripheral enhancement and central necrosis highly suspicious for a breast neoplasm. Diagnostic mammography and tissue sampling is recommended as clinically indicated. CT of the abdomen and pelvis: Cholelithiasis without complicating factors. T11 and L2 compression deformities which appear chronic in nature. No other focal abnormality is noted. Electronically Signed   By: Inez Catalina M.D.   On: 09/21/2020 21:15   CT Abdomen Pelvis W Contrast  Result Date: 09/21/2020 CLINICAL DATA:  Chest and abdominal pain with foul-smelling urine, initial encounter EXAM: CT ANGIOGRAPHY CHEST CT ABDOMEN AND PELVIS WITH CONTRAST TECHNIQUE: Multidetector CT imaging of the chest was performed using the standard protocol during bolus administration of intravenous contrast. Multiplanar CT image reconstructions and MIPs were obtained to evaluate the vascular anatomy. Multidetector CT imaging of the abdomen and pelvis was performed using the standard protocol during bolus administration of intravenous contrast. CONTRAST:  126mL OMNIPAQUE IOHEXOL 350 MG/ML SOLN COMPARISON:  Chest x-ray from earlier in the same day, CT from 04/25/2020 FINDINGS: CTA CHEST FINDINGS Cardiovascular: Thoracic aorta demonstrates mild atherosclerotic calcifications. No aneurysmal dilatation or dissection is noted. The heart is not significantly enlarged in size. The pulmonary artery shows a normal branching pattern bilaterally. No filling defect to suggest pulmonary embolism is seen. Coronary calcifications are noted. Mediastinum/Nodes: Thoracic inlet is within normal limits. Scattered noncalcified hilar lymph nodes are noted bilaterally. The largest of these on the left measures approximately 12 mm in short axis. Largest of these on the right  measures approximately 11 mm in short axis. A few calcified lymph nodes are noted within the right hilum likely related to prior granulomatous disease. The esophagus as visualized appears within normal limits. Lungs/Pleura: Large right-sided pleural effusion is noted. The residual right lung is well aerated with scattered pulmonary nodules identified. The largest of these lies in the right upper lobe best seen on image number 44 of series 4 measuring approximately 7 mm. This was not well appreciated on the recent exam from February of this year. Consolidation in the right lower lobe is noted related to the underlying effusion. Multiple nodular densities  are noted throughout the left lower lobe which have increased in the interval from the prior exam. The largest of these is noted on image number 68 of series 4. This nodule was not present on the prior exam. No sizable effusion on the left is noted. Scattered smaller left-sided nodules are seen. The previously seen lower lobe nodule has increased in size now measuring approximately 10 mm in dimension. Musculoskeletal: Degenerative changes of the thoracic spine are noted. No discrete lytic or sclerotic lesions are seen. There is however a soft tissue mass lesion in the right breast which was not well appreciated on the prior exam due to the imaging technique. This measures approximately 7.0 x 4.5 cm in greatest dimension. Delayed images demonstrates some peripheral enhancement particularly inferiorly with central necrosis. This is suspicious for underlying breast neoplasm until proven otherwise. No sizable associated adenopathy is noted. Review of the MIP images confirms the above findings. CT ABDOMEN and PELVIS FINDINGS Hepatobiliary: Liver is well visualized and mildly fatty infiltrated. No focal mass is seen. Changes of cholelithiasis are seen without complicating factors. Pancreas: Unremarkable. No pancreatic ductal dilatation or surrounding inflammatory changes.  Spleen: Normal in size without focal abnormality. Adrenals/Urinary Tract: Adrenal glands are within normal limits. Kidneys demonstrate a normal enhancement pattern bilaterally. No renal calculi or obstructive changes are seen. The ureters are within normal limits. The bladder is well distended. Stomach/Bowel: Colon shows no obstructive or inflammatory changes. Visualized small bowel is within normal limits. The appendix is not well appreciated. No inflammatory changes to suggest appendicitis are noted. Small bowel and stomach are unremarkable. Vascular/Lymphatic: Aortic atherosclerosis. No enlarged abdominal or pelvic lymph nodes. Reproductive: Status post hysterectomy. No adnexal masses. Other: No abdominal wall hernia or abnormality. No abdominopelvic ascites. Musculoskeletal: Compression deformities of T11 and L2 are seen. These appear chronic in nature. No lytic or sclerotic lesion is seen. Review of the MIP images confirms the above findings. IMPRESSION: CTA of the chest: No evidence of pulmonary emboli. Large right-sided pleural effusion. Multiple pulmonary nodules are noted bilaterally suspicious for metastatic disease. These have increased in both size and number when compare with the prior exam of February of 2022. Some associated hilar adenopathy is noted. Large right breast mass with peripheral enhancement and central necrosis highly suspicious for a breast neoplasm. Diagnostic mammography and tissue sampling is recommended as clinically indicated. CT of the abdomen and pelvis: Cholelithiasis without complicating factors. T11 and L2 compression deformities which appear chronic in nature. No other focal abnormality is noted. Electronically Signed   By: Inez Catalina M.D.   On: 09/21/2020 21:15   DG Chest Portable 1 View  Result Date: 09/21/2020 CLINICAL DATA:  Short of breath, tachycardia EXAM: PORTABLE CHEST 1 VIEW COMPARISON:  04/25/2020 FINDINGS: Left lung grossly clear. Asymmetric opacity in the  right thorax likely due to moderate layering effusion. Airspace disease at the right base. Stable cardiomediastinal silhouette. IMPRESSION: Asymmetric opacity in the right thorax suspected to be secondary to at least moderate layering pleural effusion. Airspace disease at right base may reflect atelectasis or pneumonia Electronically Signed   By: Donavan Foil M.D.   On: 09/21/2020 17:30    Procedures .Critical Care  Date/Time: 09/21/2020 10:11 PM Performed by: Nettie Elm, PA-C Authorized by: Nettie Elm, PA-C   Critical care provider statement:    Critical care time (minutes):  45   Critical care was necessary to treat or prevent imminent or life-threatening deterioration of the following conditions:  Respiratory failure  Critical care was time spent personally by me on the following activities:  Discussions with consultants, evaluation of patient's response to treatment, examination of patient, ordering and performing treatments and interventions, ordering and review of laboratory studies, ordering and review of radiographic studies, pulse oximetry, re-evaluation of patient's condition, obtaining history from patient or surrogate and review of old charts   Medications Ordered in ED Medications  albuterol (VENTOLIN HFA) 108 (90 Base) MCG/ACT inhaler 2 puff (2 puffs Inhalation Not Given 09/21/20 1929)  sodium chloride 0.9 % bolus 1,000 mL (0 mLs Intravenous Stopped 09/21/20 1908)  LORazepam (ATIVAN) injection 1 mg (1 mg Intramuscular Given 09/21/20 1701)  iohexol (OMNIPAQUE) 350 MG/ML injection 100 mL (100 mLs Intravenous Contrast Given 09/21/20 2041)    ED Course  I have reviewed the triage vital signs and the nursing notes.  Pertinent labs & imaging results that were available during my care of the patient were reviewed by me and considered in my medical decision making (see chart for details).  75 year old nonverbal here for evaluation of multiple complaints to be a caregiver.  On  arrival she is afebrile, nonseptic however appears chronically ill.  Patient is tachycardic, tachypneic.  She has coarse lung sounds with decreased lung sounds on right.  She has known breast cancer.  There was some concern for some malodorous urine.  Her abdomen is soft, nontender.  We will plan on broad work-up given patient nonverbal  On reassessment patient hypoxic into high 80's. Patient will not keep [place] on face.  Oxygen drops to 87% on RA with mild movement  Labs and imaging personally reviewed and interpreted:  UA negative for infection Troponin 4 Lipase 32 9  lactic acid 1.4 CBC without leukocytosis, hemoglobin 0.8 Metabolic panel without significant abnormality   CTA negative for PE however does have large left pleural effusion, high suspicion for pulmonary nodules consistent with likely metastatic disease. CT abdomen pelvis without acute abnormality, does have cholelithiasis however right upper quadrant abdomen soft, nontender low suspicion for infectious etiology of gallbladder  Discussed with caregiver at baseline.  States they have updated family.  Patient wanted to be admitted for acute hypoxic respiratory failure likely due to large right pleural effusion  CONSULT with Dr. Marlowe Sax with [facility] who will evaluate patient for admission  Patient seen eval by attending Dr. Dina Rich who agrees with above treatment, plan disposition  The patient appears reasonably stabilized for admission considering the current resources, flow, and capabilities available in the ED at this time, and I doubt any other [facility] requiring further screening and/or treatment in the ED prior to admission.    MDM Rules/Calculators/A&P                           Final Clinical Impression(s) / ED Diagnoses Final diagnoses:  Acute respiratory failure with hypoxia (HCC)  Pleural effusion    Rx / DC Orders ED Discharge Orders     None        Li Bobo A, PA-C 09/21/20 2211    Horton, Alvin Critchley,  DO 09/22/20 0122

## 2020-09-21 NOTE — ED Notes (Signed)
Lab called regarding CBC, lab states 15 more minutes for result

## 2020-09-21 NOTE — ED Notes (Signed)
Caregiver at bedside

## 2020-09-21 NOTE — ED Notes (Signed)
On call nurse at [facility] called to notify of pt admission

## 2020-09-21 NOTE — ED Notes (Signed)
Spoke with the on call nurse at [facility] regarding pt status. Will call nurse back with pt updates

## 2020-09-22 ENCOUNTER — Inpatient Hospital Stay (HOSPITAL_COMMUNITY): Payer: Medicare Other

## 2020-09-22 DIAGNOSIS — J910 Malignant pleural effusion: Secondary | ICD-10-CM | POA: Diagnosis not present

## 2020-09-22 DIAGNOSIS — J9690 Respiratory failure, unspecified, unspecified whether with hypoxia or hypercapnia: Secondary | ICD-10-CM

## 2020-09-22 DIAGNOSIS — C7800 Secondary malignant neoplasm of unspecified lung: Secondary | ICD-10-CM

## 2020-09-22 DIAGNOSIS — C50912 Malignant neoplasm of unspecified site of left female breast: Secondary | ICD-10-CM

## 2020-09-22 DIAGNOSIS — J90 Pleural effusion, not elsewhere classified: Secondary | ICD-10-CM | POA: Diagnosis not present

## 2020-09-22 LAB — COMPREHENSIVE METABOLIC PANEL
ALT: 16 U/L (ref 0–44)
AST: 21 U/L (ref 15–41)
Albumin: 3 g/dL — ABNORMAL LOW (ref 3.5–5.0)
Alkaline Phosphatase: 36 U/L — ABNORMAL LOW (ref 38–126)
Anion gap: 7 (ref 5–15)
BUN: 8 mg/dL (ref 8–23)
CO2: 25 mmol/L (ref 22–32)
Calcium: 8.1 mg/dL — ABNORMAL LOW (ref 8.9–10.3)
Chloride: 110 mmol/L (ref 98–111)
Creatinine, Ser: 0.53 mg/dL (ref 0.44–1.00)
GFR, Estimated: >60 mL/min (ref >60)
Glucose, Bld: 100 mg/dL — ABNORMAL HIGH (ref 70–99)
Potassium: 3.9 mmol/L (ref 3.5–5.1)
Sodium: 142 mmol/L (ref 135–145)
Total Bilirubin: 0.5 mg/dL (ref 0.3–1.2)
Total Protein: 5.8 g/dL — ABNORMAL LOW (ref 6.5–8.1)

## 2020-09-22 LAB — CBC WITH DIFFERENTIAL/PLATELET
Abs Immature Granulocytes: 0.05 10*3/uL (ref 0.00–0.07)
Basophils Absolute: 0.1 10*3/uL (ref 0.0–0.1)
Basophils Relative: 1 %
Eosinophils Absolute: 0.2 10*3/uL (ref 0.0–0.5)
Eosinophils Relative: 3 %
HCT: 39.7 % (ref 36.0–46.0)
Hemoglobin: 12.5 g/dL (ref 12.0–15.0)
Immature Granulocytes: 1 %
Lymphocytes Relative: 26 %
Lymphs Abs: 2.4 10*3/uL (ref 0.7–4.0)
MCH: 30.2 pg (ref 26.0–34.0)
MCHC: 31.5 g/dL (ref 30.0–36.0)
MCV: 95.9 fL (ref 80.0–100.0)
Monocytes Absolute: 0.9 10*3/uL (ref 0.1–1.0)
Monocytes Relative: 9 %
Neutro Abs: 5.8 10*3/uL (ref 1.7–7.7)
Neutrophils Relative %: 60 %
Platelets: 216 10*3/uL (ref 150–400)
RBC: 4.14 MIL/uL (ref 3.87–5.11)
RDW: 13.8 % (ref 11.5–15.5)
WBC: 9.4 10*3/uL (ref 4.0–10.5)
nRBC: 0 % (ref 0.0–0.2)

## 2020-09-22 LAB — PROTIME-INR
INR: 1 (ref 0.8–1.2)
Prothrombin Time: 13.2 seconds (ref 11.4–15.2)

## 2020-09-22 LAB — MAGNESIUM: Magnesium: 2 mg/dL (ref 1.7–2.4)

## 2020-09-22 MED ORDER — ACETAMINOPHEN 650 MG RE SUPP: 650.0000 mg | Freq: Four times a day (QID) | RECTAL | Status: DC | PRN

## 2020-09-22 MED ORDER — OMEGA-3-ACID ETHYL ESTERS 1 G PO CAPS
1.0000 g | ORAL_CAPSULE | Freq: Every day | ORAL | Status: DC
  Administered 2020-09-23 – 2020-09-26 (×4): 1 g via ORAL
  Filled 2020-09-22 (×4): qty 1

## 2020-09-22 MED ORDER — ACETAMINOPHEN 325 MG PO TABS
650.0000 mg | ORAL_TABLET | Freq: Four times a day (QID) | ORAL | Status: DC | PRN
  Administered 2020-09-22 – 2020-09-25 (×4): 650 mg via ORAL
  Filled 2020-09-22 (×4): qty 2

## 2020-09-22 MED ORDER — ASCORBIC ACID 500 MG PO TABS
500.0000 mg | ORAL_TABLET | Freq: Two times a day (BID) | ORAL | Status: DC
  Administered 2020-09-22 – 2020-09-26 (×8): 500 mg via ORAL
  Filled 2020-09-22 (×8): qty 1

## 2020-09-22 MED ORDER — DIVALPROEX SODIUM 125 MG PO CSDR
250.0000 mg | DELAYED_RELEASE_CAPSULE | Freq: Two times a day (BID) | ORAL | Status: DC
  Administered 2020-09-22 – 2020-09-27 (×10): 250 mg via ORAL
  Filled 2020-09-22 (×11): qty 2

## 2020-09-22 MED ORDER — TAMOXIFEN CITRATE 10 MG PO TABS
20.0000 mg | ORAL_TABLET | Freq: Every day | ORAL | Status: DC
  Administered 2020-09-23 – 2020-09-24 (×2): 20 mg via ORAL
  Filled 2020-09-22 (×3): qty 2

## 2020-09-22 MED ORDER — LORAZEPAM 2 MG/ML IJ SOLN
0.5000 mg | Freq: Four times a day (QID) | INTRAMUSCULAR | Status: DC | PRN
  Administered 2020-09-22: 0.5 mg via INTRAVENOUS
  Filled 2020-09-22: qty 1

## 2020-09-22 MED ORDER — SENNOSIDES-DOCUSATE SODIUM 8.6-50 MG PO TABS
2.0000 | ORAL_TABLET | Freq: Every day | ORAL | Status: DC
  Administered 2020-09-22 – 2020-09-26 (×5): 2 via ORAL
  Filled 2020-09-22 (×5): qty 2

## 2020-09-22 MED ORDER — PROMETHAZINE HCL 25 MG RE SUPP: 25.0000 mg | RECTAL | Status: DC | PRN

## 2020-09-22 MED ORDER — VITAMIN D3 25 MCG (1000 UNIT) PO TABS
4000.0000 [IU] | ORAL_TABLET | Freq: Every day | ORAL | Status: DC
  Administered 2020-09-23 – 2020-09-26 (×4): 4000 [IU] via ORAL
  Filled 2020-09-22 (×4): qty 4

## 2020-09-22 MED ORDER — GOLD BOND EX POWD: 1.0000 "application " | Freq: Two times a day (BID) | CUTANEOUS | Status: DC

## 2020-09-22 NOTE — ED Notes (Signed)
Per [place] tech, states they could not do procedure-"too unstable"-they need to do under sedation-informed tech she needed to inform provider

## 2020-09-22 NOTE — ED Notes (Signed)
Pt ate mashed potatoes from lunch tray and drank lemonade.  No suspected aspiration.  Will ask admitting MD to change diet to puree and pt does not appear to be able to chew.

## 2020-09-22 NOTE — ED Notes (Signed)
Patient transported to Ultrasound 

## 2020-09-22 NOTE — H&P (Signed)
Chief Complaint: Presumed malignant pleural effusion  Referring Physician(s): Cherylann Ratel, MD  Supervising Physician: Mir, Sharen Heck  Patient Status: [facility]  History of Present Illness: Paula Cantu is a 75 y.o. female with cognitive impairment who was brought in by her facility due to foul smelling urine.  There was also concern for difficulty breathing.  CTA revealed pleural effusion, pulmonary nodules, and a right breast mass.    A thoracentesis was requested however the patient is non-verbal and does not follow commands.  I spoke with her cousin who is her legal guardian and she informed me that Paula Cantu has to have sedation for any procedure due to her cognitive impairment.  I discussed the case with Dr. Dwaine Gale.  Since this is presumed malignant effusion, and would likely re-accumulate, he recommends pigtail chest tube at first.  This would avoid having to plan sedation each time she would need a thoracentesis.  Depending on cytology and how much/fast fluid re-accumulates, she may need to be transitioned to a tunneled pleural catheter (PleurX) and transition to Hospice/Palliative care.   Past Medical History:  Diagnosis Date   Arthritis    Cancer ([place])    endometrial   Constipation    Diabetes mellitus without complication ([place])    takes metformin   Frequent falls    Gait disorder    Incontinence of bowel    Incontinence of urine    Intellectual disability    severe intellectual disability, non-verbal   Osteopenia    Seizures ([place])    remote history; possible febrile childhood seizure    Past Surgical History:  Procedure Laterality Date   ABDOMINAL HYSTERECTOMY     BREAST LUMPECTOMY Left 03/04/2013   Procedure: LEFT BREAST LUMPECTOMY;  Surgeon: [name], MD;  Location: [place];  Service: General;  Laterality: Left;   RADIOLOGY WITH ANESTHESIA N/A 09/16/2016   Procedure: RADIOLOGY WITH ANESTHESIA MRI OF THE BRAIN WITH AND WITHOUT;  Surgeon:  Radiologist, Medication, MD;  Location: [place];  Service: Radiology;  Laterality: N/A;   RADIOLOGY WITH ANESTHESIA N/A 09/15/2017   Procedure: MRI WITH ANESTHESIA OF THE BRAIN WITH AND WITHOUT AND CERVICAL WITHOUT CONTRAST;  Surgeon: Radiologist, Medication, MD;  Location: [place];  Service: Radiology;  Laterality: N/A;    Allergies: Lactose intolerance (gi), Bacillus, and Ppd [tuberculin purified protein derivative]  Medications: Prior to Admission medications   Medication Sig Start Date End Date Taking? Authorizing Provider  Cholecalciferol (VITAMIN D3) 50 MCG (2000 UT) capsule Take 4,000 Units by mouth daily.   Yes [provider]  divalproex (DEPAKOTE SPRINKLES) 125 MG capsule Take 2 capsules (250 mg total) by mouth 2 (two) times daily. 11/24/17  Yes Marcial Pacas, MD  menthol-zinc oxide (GOLD BOND) powder Apply 1 application topically 2 (two) times daily. Apply under breasts   Yes [provider]  metFORMIN (GLUCOPHAGE) 850 MG tablet Take 850 mg by mouth daily with breakfast.   Yes [provider]  Omega-3 Fatty Acids (FISH OIL PO) Take 1,200 mg by mouth daily. Fish oil 1600mg / 74mL   --- W/ Food   Yes [provider]  promethazine (PHENERGAN) 25 MG suppository Place 25 mg rectally every 4 (four) hours as needed for vomiting.   Yes [provider]  promethazine (PHENERGAN) 25 MG tablet Take 25 mg by mouth every 4 (four) hours as needed for vomiting. Notify md if symptoms persist over 12 hours   Yes [provider]  sennosides-docusate sodium (SENOKOT-S)  8.6-50 MG tablet Take 2 tablets by mouth at bedtime.   Yes [provider]  tamoxifen (NOLVADEX) 20 MG tablet Take 20 mg by mouth daily.   Yes [provider]  vitamin C (ASCORBIC ACID) 500 MG tablet Take 500 mg by mouth 2 (two) times daily.   Yes [provider]     History reviewed. No pertinent family history.  Social History   Socioeconomic History   Marital  status: Single    Spouse name: Not on file   Number of children: 0   Years of education: Not on file   Highest education level: Not on file  Occupational History   Occupation: Disabled  Tobacco Use   Smoking status: Never   Smokeless tobacco: Never  Vaping Use   Vaping Use: Never used  Substance and Sexual Activity   Alcohol use: No   Drug use: No   Sexual activity: Not on file  Other Topics Concern   Not on file  Social History Narrative   Lives at [facility].   She is ambidextrous.   Occasional caffeine use.   Social Determinants of Health   Financial Resource Strain: Not on file  Food Insecurity: Not on file  Transportation Needs: Not on file  Physical Activity: Not on file  Stress: Not on file  Social Connections: Not on file     Review of Systems  Unable to perform ROS: Patient nonverbal   Vital Signs: BP 138/81   Pulse 71   Temp (!) 96.9 F (36.1 C) (Axillary)   Resp 16   Ht 4' (1.219 m)   Wt 47.2 kg   SpO2 100%   BMI 31.74 kg/m   Physical Exam Vitals reviewed.  Constitutional:      Comments: Wiggling and moving around in the bed  Cardiovascular:     Rate and Rhythm: Normal rate.  Pulmonary:     Effort: Pulmonary effort is normal. No respiratory distress.     Comments: Breath sounds diminished on right. Skin:    General: Skin is warm and dry.  Psychiatric:     Comments: Cognitive impairment, non-verbal    Imaging: CT Angio Chest PE W/Cm &/Or Wo Cm  Result Date: 09/21/2020 CLINICAL DATA:  Chest and abdominal pain with foul-smelling urine, initial encounter EXAM: CT ANGIOGRAPHY CHEST CT ABDOMEN AND PELVIS WITH CONTRAST TECHNIQUE: Multidetector CT imaging of the chest was performed using the standard protocol during bolus administration of intravenous contrast. Multiplanar CT image reconstructions and MIPs were obtained to evaluate the vascular anatomy. Multidetector CT imaging of the abdomen and pelvis was performed using the standard protocol during  bolus administration of intravenous contrast. CONTRAST:  111mL OMNIPAQUE IOHEXOL 350 MG/ML SOLN COMPARISON:  Chest x-ray from earlier in the same day, CT from 04/25/2020 FINDINGS: CTA CHEST FINDINGS Cardiovascular: Thoracic aorta demonstrates mild atherosclerotic calcifications. No aneurysmal dilatation or dissection is noted. The heart is not significantly enlarged in size. The pulmonary artery shows a normal branching pattern bilaterally. No filling defect to suggest pulmonary embolism is seen. Coronary calcifications are noted. Mediastinum/Nodes: Thoracic inlet is within normal limits. Scattered noncalcified hilar lymph nodes are noted bilaterally. The largest of these on the left measures approximately 12 mm in short axis. Largest of these on the right measures approximately 11 mm in short axis. A few calcified lymph nodes are noted within the right hilum likely related to prior granulomatous disease. The esophagus as visualized appears within normal limits. Lungs/Pleura: Large right-sided pleural effusion is noted. The  residual right lung is well aerated with scattered pulmonary nodules identified. The largest of these lies in the right upper lobe best seen on image number 44 of series 4 measuring approximately 7 mm. This was not well appreciated on the recent exam from February of this year. Consolidation in the right lower lobe is noted related to the underlying effusion. Multiple nodular densities are noted throughout the left lower lobe which have increased in the interval from the prior exam. The largest of these is noted on image number 68 of series 4. This nodule was not present on the prior exam. No sizable effusion on the left is noted. Scattered smaller left-sided nodules are seen. The previously seen lower lobe nodule has increased in size now measuring approximately 10 mm in dimension. Musculoskeletal: Degenerative changes of the thoracic spine are noted. No discrete lytic or sclerotic lesions are  seen. There is however a soft tissue mass lesion in the right breast which was not well appreciated on the prior exam due to the imaging technique. This measures approximately 7.0 x 4.5 cm in greatest dimension. Delayed images demonstrates some peripheral enhancement particularly inferiorly with central necrosis. This is suspicious for underlying breast neoplasm until proven otherwise. No sizable associated adenopathy is noted. Review of the MIP images confirms the above findings. CT ABDOMEN and PELVIS FINDINGS Hepatobiliary: Liver is well visualized and mildly fatty infiltrated. No focal mass is seen. Changes of cholelithiasis are seen without complicating factors. Pancreas: Unremarkable. No pancreatic ductal dilatation or surrounding inflammatory changes. Spleen: Normal in size without focal abnormality. Adrenals/Urinary Tract: Adrenal glands are within normal limits. Kidneys demonstrate a normal enhancement pattern bilaterally. No renal calculi or obstructive changes are seen. The ureters are within normal limits. The bladder is well distended. Stomach/Bowel: Colon shows no obstructive or inflammatory changes. Visualized small bowel is within normal limits. The appendix is not well appreciated. No inflammatory changes to suggest appendicitis are noted. Small bowel and stomach are unremarkable. Vascular/Lymphatic: Aortic atherosclerosis. No enlarged abdominal or pelvic lymph nodes. Reproductive: Status post hysterectomy. No adnexal masses. Other: No abdominal wall hernia or abnormality. No abdominopelvic ascites. Musculoskeletal: Compression deformities of T11 and L2 are seen. These appear chronic in nature. No lytic or sclerotic lesion is seen. Review of the MIP images confirms the above findings. IMPRESSION: CTA of the chest: No evidence of pulmonary emboli. Large right-sided pleural effusion. Multiple pulmonary nodules are noted bilaterally suspicious for metastatic disease. These have increased in both size  and number when compare with the prior exam of February of 2022. Some associated hilar adenopathy is noted. Large right breast mass with peripheral enhancement and central necrosis highly suspicious for a breast neoplasm. Diagnostic mammography and tissue sampling is recommended as clinically indicated. CT of the abdomen and pelvis: Cholelithiasis without complicating factors. T11 and L2 compression deformities which appear chronic in nature. No other focal abnormality is noted. Electronically Signed   By: Inez Catalina M.D.   On: 09/21/2020 21:15   CT Abdomen Pelvis W Contrast  Result Date: 09/21/2020 CLINICAL DATA:  Chest and abdominal pain with foul-smelling urine, initial encounter EXAM: CT ANGIOGRAPHY CHEST CT ABDOMEN AND PELVIS WITH CONTRAST TECHNIQUE: Multidetector CT imaging of the chest was performed using the standard protocol during bolus administration of intravenous contrast. Multiplanar CT image reconstructions and MIPs were obtained to evaluate the vascular anatomy. Multidetector CT imaging of the abdomen and pelvis was performed using the standard protocol during bolus administration of intravenous contrast. CONTRAST:  162mL OMNIPAQUE IOHEXOL 350  MG/ML SOLN COMPARISON:  Chest x-ray from earlier in the same day, CT from 04/25/2020 FINDINGS: CTA CHEST FINDINGS Cardiovascular: Thoracic aorta demonstrates mild atherosclerotic calcifications. No aneurysmal dilatation or dissection is noted. The heart is not significantly enlarged in size. The pulmonary artery shows a normal branching pattern bilaterally. No filling defect to suggest pulmonary embolism is seen. Coronary calcifications are noted. Mediastinum/Nodes: Thoracic inlet is within normal limits. Scattered noncalcified hilar lymph nodes are noted bilaterally. The largest of these on the left measures approximately 12 mm in short axis. Largest of these on the right measures approximately 11 mm in short axis. A few calcified lymph nodes are noted  within the right hilum likely related to prior granulomatous disease. The esophagus as visualized appears within normal limits. Lungs/Pleura: Large right-sided pleural effusion is noted. The residual right lung is well aerated with scattered pulmonary nodules identified. The largest of these lies in the right upper lobe best seen on image number 44 of series 4 measuring approximately 7 mm. This was not well appreciated on the recent exam from February of this year. Consolidation in the right lower lobe is noted related to the underlying effusion. Multiple nodular densities are noted throughout the left lower lobe which have increased in the interval from the prior exam. The largest of these is noted on image number 68 of series 4. This nodule was not present on the prior exam. No sizable effusion on the left is noted. Scattered smaller left-sided nodules are seen. The previously seen lower lobe nodule has increased in size now measuring approximately 10 mm in dimension. Musculoskeletal: Degenerative changes of the thoracic spine are noted. No discrete lytic or sclerotic lesions are seen. There is however a soft tissue mass lesion in the right breast which was not well appreciated on the prior exam due to the imaging technique. This measures approximately 7.0 x 4.5 cm in greatest dimension. Delayed images demonstrates some peripheral enhancement particularly inferiorly with central necrosis. This is suspicious for underlying breast neoplasm until proven otherwise. No sizable associated adenopathy is noted. Review of the MIP images confirms the above findings. CT ABDOMEN and PELVIS FINDINGS Hepatobiliary: Liver is well visualized and mildly fatty infiltrated. No focal mass is seen. Changes of cholelithiasis are seen without complicating factors. Pancreas: Unremarkable. No pancreatic ductal dilatation or surrounding inflammatory changes. Spleen: Normal in size without focal abnormality. Adrenals/Urinary Tract: Adrenal  glands are within normal limits. Kidneys demonstrate a normal enhancement pattern bilaterally. No renal calculi or obstructive changes are seen. The ureters are within normal limits. The bladder is well distended. Stomach/Bowel: Colon shows no obstructive or inflammatory changes. Visualized small bowel is within normal limits. The appendix is not well appreciated. No inflammatory changes to suggest appendicitis are noted. Small bowel and stomach are unremarkable. Vascular/Lymphatic: Aortic atherosclerosis. No enlarged abdominal or pelvic lymph nodes. Reproductive: Status post hysterectomy. No adnexal masses. Other: No abdominal wall hernia or abnormality. No abdominopelvic ascites. Musculoskeletal: Compression deformities of T11 and L2 are seen. These appear chronic in nature. No lytic or sclerotic lesion is seen. Review of the MIP images confirms the above findings. IMPRESSION: CTA of the chest: No evidence of pulmonary emboli. Large right-sided pleural effusion. Multiple pulmonary nodules are noted bilaterally suspicious for metastatic disease. These have increased in both size and number when compare with the prior exam of February of 2022. Some associated hilar adenopathy is noted. Large right breast mass with peripheral enhancement and central necrosis highly suspicious for a breast neoplasm. Diagnostic mammography and  tissue sampling is recommended as clinically indicated. CT of the abdomen and pelvis: Cholelithiasis without complicating factors. T11 and L2 compression deformities which appear chronic in nature. No other focal abnormality is noted. Electronically Signed   By: Inez Catalina M.D.   On: 09/21/2020 21:15   DG Chest Portable 1 View  Result Date: 09/21/2020 CLINICAL DATA:  Short of breath, tachycardia EXAM: PORTABLE CHEST 1 VIEW COMPARISON:  04/25/2020 FINDINGS: Left lung grossly clear. Asymmetric opacity in the right thorax likely due to moderate layering effusion. Airspace disease at the right  base. Stable cardiomediastinal silhouette. IMPRESSION: Asymmetric opacity in the right thorax suspected to be secondary to at least moderate layering pleural effusion. Airspace disease at right base may reflect atelectasis or pneumonia Electronically Signed   By: Donavan Foil M.D.   On: 09/21/2020 17:30    Labs:  CBC: Recent Labs    03/19/20 0450 04/25/20 1536 09/21/20 1622 09/22/20 1008  WBC 15.9* 10.6* 10.4 9.4  HGB 13.5 13.2 11.8* 12.5  HCT 40.2 40.5 36.7 39.7  PLT 192 181 217 216    COAGS: Recent Labs    03/13/20 2214 09/22/20 1008  INR 1.0 1.0  APTT 26  --     BMP: Recent Labs    03/19/20 0450 04/25/20 1536 09/21/20 1622 09/22/20 1008  NA 141 143 139 142  K 3.7 3.8 3.8 3.9  CL 105 109 110 110  CO2 27 19* 23 25  GLUCOSE 112* 141* 112* 100*  BUN 11 21 11 8   CALCIUM 8.3* 8.9 8.1* 8.1*  CREATININE 0.69 0.89 0.59 0.53  GFRNONAA >60 >60 >60 >60    LIVER FUNCTION TESTS: Recent Labs    03/19/20 0450 04/25/20 1536 09/21/20 1622 09/22/20 1008  BILITOT 0.8 0.4 0.5 0.5  AST 28 24 17 21   ALT 28 16 12 16   ALKPHOS 36* 37* 33* 36*  PROT 5.5* 6.3* 5.7* 5.8*  ALBUMIN 2.8* 3.3* 3.0* 3.0*    TUMOR MARKERS: No results for input(s): AFPTM, CEA, CA199, CHROMGRNA in the last 8760 hours.  Assessment and Plan:  Presumed malignant effusion on Right given large right breast mass.  Case discussed with Dr. Dwaine Gale. He also reviewed images.  Since this is presumed malignant effusion, and would likely re-accumulate, will plan for pigtail chest tube on Monday.  This would avoid having to plan sedation each time she would need a thoracentesis.  Depending on cytology and how much/fast fluid re-accumulates, she may need to be transitioned to a tunneled pleural catheter (PleurX) and transition to Hospice/Palliative care.  Risks and benefits of chest tube placement were discussed with the patient's cousin including bleeding, infection, damage to adjacent structures, malfunction  of the tube requiring additional procedures and sepsis.  All questions were answered, patient is agreeable to proceed. Verbal consent obtained.  Discussed with Dr. Marylyn Ishihara and he is in agreement as well.  Thank you for this interesting consult.  I greatly enjoyed meeting Paula Cantu and look forward to participating in their care.  A copy of this report was sent to the requesting provider on this date.  Electronically Signed: Murrell Redden, PA-C   09/22/2020, 1:39 PM      I spent a total of 40 Minutes in face to face in clinical consultation, greater than 50% of which was counseling/coordinating care for pigtail chest tube.

## 2020-09-22 NOTE — Progress Notes (Signed)
Paula Cantu   DOB:[birth year]   AC#:[id number]    ASSESSMENT & PLAN:   Metastatic breast cancer to the lung until proven otherwise Right pleural effusion, likely malignant in nature She has palpable right breast mass, evident on CT imaging The patient likely has progressed on tamoxifen I spoke with her medical healthcare power of attorney She is undecided of what to do I will inform Dr. Jana Hakim that the patient will be admitted to the hospital for further treatment and work-up In the meantime, I recommend therapeutic and diagnostic thoracentesis  Mild respiratory failure secondary to pleural effusion Tachycardia Likely secondary to malignant effusion As above, provide supportive care  Goals of care discussion I told the caregiver that the patient have stage IV disease which is incurable Given the nature of her nonverbal/intellectual disability, the patient will not be able to make any form of informed decision about treatment options Her healthcare power of attorney does not want to make any decision this weekend I did suggest palliative care consult but she would like to hold off until she has a chance to talk to Dr. Jana Hakim on Monday  Discharge planning She will likely be here over the weekend Dr. Jana Hakim will round on her next week Please call if questions arise All questions were answered. The patient knows to call the clinic with any problems, questions or concerns.   The total time spent in the appointment was 40 minutes encounter with patients including review of chart and various tests results, discussions about plan of care and coordination of care plan  Heath Lark, MD 09/22/2020 10:28 AM  Subjective:  This is a patient of Dr. Jana Hakim, last seen in May She was noted to have large breast mass Diagnostic mammogram and ultrasound has been arranged The patient is nonverbal I spoke with her healthcare power of attorney after my initial assessment She was brought in due  to signs of respiratory failure and hypoxia Imaging study reviewed large pleural effusion, confirmed on CT imaging which also coincidentally showed multiple pulmonary metastasis  Objective:  Vitals:   09/22/20 0600 09/22/20 0816  BP: (!) 155/80 (!) 160/114  Pulse: (!) 106 (!) 122  Resp: (!) 22 (!) 25  Temp:    SpO2: 94% 95%    No intake or output data in the 24 hours ending 09/22/20 1028  GENERAL:alert, appears somewhat agitated, nonverbal SKIN: skin color, texture, turgor are normal, no rashes or significant lesions EYES: normal, Conjunctiva are pink and non-injected, sclera clear OROPHARYNX:no exudate, no erythema and lips, buccal mucosa, and tongue normal  NECK: supple, thyroid normal size, non-tender, without nodularity LYMPH:  no palpable lymphadenopathy in the cervical, axillary or inguinal LUNGS: Clear audible expiratory wheeze with reduced breath sounds throughout the right lung HEART: tachycardia, no murmurs and no lower extremity edema ABDOMEN:abdomen soft, non-tender and normal bowel sounds Musculoskeletal:no cyanosis of digits and no clubbing.  She has large palpable right breast mass which is firm on palpation NEURO: alert, moving all 4 limbs   Labs:  Recent Labs    03/19/20 0450 04/25/20 1536 09/21/20 1622  NA 141 143 139  K 3.7 3.8 3.8  CL 105 109 110  CO2 27 19* 23  GLUCOSE 112* 141* 112*  BUN 11 21 11   CREATININE 0.69 0.89 0.59  CALCIUM 8.3* 8.9 8.1*  GFRNONAA >60 >60 >60  PROT 5.5* 6.3* 5.7*  ALBUMIN 2.8* 3.3* 3.0*  AST 28 24 17   ALT 28 16 12   ALKPHOS 36* 37* 33*  BILITOT 0.8 0.4 0.5    Studies: I have personally reviewed her imaging studies CT Angio Chest PE W/Cm &/Or Wo Cm  Result Date: 09/21/2020 CLINICAL DATA:  Chest and abdominal pain with foul-smelling urine, initial encounter EXAM: CT ANGIOGRAPHY CHEST CT ABDOMEN AND PELVIS WITH CONTRAST TECHNIQUE: Multidetector CT imaging of the chest was performed using the standard protocol during bolus  administration of intravenous contrast. Multiplanar CT image reconstructions and MIPs were obtained to evaluate the vascular anatomy. Multidetector CT imaging of the abdomen and pelvis was performed using the standard protocol during bolus administration of intravenous contrast. CONTRAST:  166mL OMNIPAQUE IOHEXOL 350 MG/ML SOLN COMPARISON:  Chest x-ray from earlier in the same day, CT from 04/25/2020 FINDINGS: CTA CHEST FINDINGS Cardiovascular: Thoracic aorta demonstrates mild atherosclerotic calcifications. No aneurysmal dilatation or dissection is noted. The heart is not significantly enlarged in size. The pulmonary artery shows a normal branching pattern bilaterally. No filling defect to suggest pulmonary embolism is seen. Coronary calcifications are noted. Mediastinum/Nodes: Thoracic inlet is within normal limits. Scattered noncalcified hilar lymph nodes are noted bilaterally. The largest of these on the left measures approximately 12 mm in short axis. Largest of these on the right measures approximately 11 mm in short axis. A few calcified lymph nodes are noted within the right hilum likely related to prior granulomatous disease. The esophagus as visualized appears within normal limits. Lungs/Pleura: Large right-sided pleural effusion is noted. The residual right lung is well aerated with scattered pulmonary nodules identified. The largest of these lies in the right upper lobe best seen on image number 44 of series 4 measuring approximately 7 mm. This was not well appreciated on the recent exam from February of this year. Consolidation in the right lower lobe is noted related to the underlying effusion. Multiple nodular densities are noted throughout the left lower lobe which have increased in the interval from the prior exam. The largest of these is noted on image number 68 of series 4. This nodule was not present on the prior exam. No sizable effusion on the left is noted. Scattered smaller left-sided nodules  are seen. The previously seen lower lobe nodule has increased in size now measuring approximately 10 mm in dimension. Musculoskeletal: Degenerative changes of the thoracic spine are noted. No discrete lytic or sclerotic lesions are seen. There is however a soft tissue mass lesion in the right breast which was not well appreciated on the prior exam due to the imaging technique. This measures approximately 7.0 x 4.5 cm in greatest dimension. Delayed images demonstrates some peripheral enhancement particularly inferiorly with central necrosis. This is suspicious for underlying breast neoplasm until proven otherwise. No sizable associated adenopathy is noted. Review of the MIP images confirms the above findings. CT ABDOMEN and PELVIS FINDINGS Hepatobiliary: Liver is well visualized and mildly fatty infiltrated. No focal mass is seen. Changes of cholelithiasis are seen without complicating factors. Pancreas: Unremarkable. No pancreatic ductal dilatation or surrounding inflammatory changes. Spleen: Normal in size without focal abnormality. Adrenals/Urinary Tract: Adrenal glands are within normal limits. Kidneys demonstrate a normal enhancement pattern bilaterally. No renal calculi or obstructive changes are seen. The ureters are within normal limits. The bladder is well distended. Stomach/Bowel: Colon shows no obstructive or inflammatory changes. Visualized small bowel is within normal limits. The appendix is not well appreciated. No inflammatory changes to suggest appendicitis are noted. Small bowel and stomach are unremarkable. Vascular/Lymphatic: Aortic atherosclerosis. No enlarged abdominal or pelvic lymph nodes. Reproductive: Status post hysterectomy. No adnexal masses.  Other: No abdominal wall hernia or abnormality. No abdominopelvic ascites. Musculoskeletal: Compression deformities of T11 and L2 are seen. These appear chronic in nature. No lytic or sclerotic lesion is seen. Review of the MIP images confirms the  above findings. IMPRESSION: CTA of the chest: No evidence of pulmonary emboli. Large right-sided pleural effusion. Multiple pulmonary nodules are noted bilaterally suspicious for metastatic disease. These have increased in both size and number when compare with the prior exam of February of 2022. Some associated hilar adenopathy is noted. Large right breast mass with peripheral enhancement and central necrosis highly suspicious for a breast neoplasm. Diagnostic mammography and tissue sampling is recommended as clinically indicated. CT of the abdomen and pelvis: Cholelithiasis without complicating factors. T11 and L2 compression deformities which appear chronic in nature. No other focal abnormality is noted. Electronically Signed   By: Inez Catalina M.D.   On: 09/21/2020 21:15   CT Abdomen Pelvis W Contrast  Result Date: 09/21/2020 CLINICAL DATA:  Chest and abdominal pain with foul-smelling urine, initial encounter EXAM: CT ANGIOGRAPHY CHEST CT ABDOMEN AND PELVIS WITH CONTRAST TECHNIQUE: Multidetector CT imaging of the chest was performed using the standard protocol during bolus administration of intravenous contrast. Multiplanar CT image reconstructions and MIPs were obtained to evaluate the vascular anatomy. Multidetector CT imaging of the abdomen and pelvis was performed using the standard protocol during bolus administration of intravenous contrast. CONTRAST:  130mL OMNIPAQUE IOHEXOL 350 MG/ML SOLN COMPARISON:  Chest x-ray from earlier in the same day, CT from 04/25/2020 FINDINGS: CTA CHEST FINDINGS Cardiovascular: Thoracic aorta demonstrates mild atherosclerotic calcifications. No aneurysmal dilatation or dissection is noted. The heart is not significantly enlarged in size. The pulmonary artery shows a normal branching pattern bilaterally. No filling defect to suggest pulmonary embolism is seen. Coronary calcifications are noted. Mediastinum/Nodes: Thoracic inlet is within normal limits. Scattered noncalcified  hilar lymph nodes are noted bilaterally. The largest of these on the left measures approximately 12 mm in short axis. Largest of these on the right measures approximately 11 mm in short axis. A few calcified lymph nodes are noted within the right hilum likely related to prior granulomatous disease. The esophagus as visualized appears within normal limits. Lungs/Pleura: Large right-sided pleural effusion is noted. The residual right lung is well aerated with scattered pulmonary nodules identified. The largest of these lies in the right upper lobe best seen on image number 44 of series 4 measuring approximately 7 mm. This was not well appreciated on the recent exam from February of this year. Consolidation in the right lower lobe is noted related to the underlying effusion. Multiple nodular densities are noted throughout the left lower lobe which have increased in the interval from the prior exam. The largest of these is noted on image number 68 of series 4. This nodule was not present on the prior exam. No sizable effusion on the left is noted. Scattered smaller left-sided nodules are seen. The previously seen lower lobe nodule has increased in size now measuring approximately 10 mm in dimension. Musculoskeletal: Degenerative changes of the thoracic spine are noted. No discrete lytic or sclerotic lesions are seen. There is however a soft tissue mass lesion in the right breast which was not well appreciated on the prior exam due to the imaging technique. This measures approximately 7.0 x 4.5 cm in greatest dimension. Delayed images demonstrates some peripheral enhancement particularly inferiorly with central necrosis. This is suspicious for underlying breast neoplasm until proven otherwise. No sizable associated adenopathy is noted. Review  of the MIP images confirms the above findings. CT ABDOMEN and PELVIS FINDINGS Hepatobiliary: Liver is well visualized and mildly fatty infiltrated. No focal mass is seen. Changes of  cholelithiasis are seen without complicating factors. Pancreas: Unremarkable. No pancreatic ductal dilatation or surrounding inflammatory changes. Spleen: Normal in size without focal abnormality. Adrenals/Urinary Tract: Adrenal glands are within normal limits. Kidneys demonstrate a normal enhancement pattern bilaterally. No renal calculi or obstructive changes are seen. The ureters are within normal limits. The bladder is well distended. Stomach/Bowel: Colon shows no obstructive or inflammatory changes. Visualized small bowel is within normal limits. The appendix is not well appreciated. No inflammatory changes to suggest appendicitis are noted. Small bowel and stomach are unremarkable. Vascular/Lymphatic: Aortic atherosclerosis. No enlarged abdominal or pelvic lymph nodes. Reproductive: Status post hysterectomy. No adnexal masses. Other: No abdominal wall hernia or abnormality. No abdominopelvic ascites. Musculoskeletal: Compression deformities of T11 and L2 are seen. These appear chronic in nature. No lytic or sclerotic lesion is seen. Review of the MIP images confirms the above findings. IMPRESSION: CTA of the chest: No evidence of pulmonary emboli. Large right-sided pleural effusion. Multiple pulmonary nodules are noted bilaterally suspicious for metastatic disease. These have increased in both size and number when compare with the prior exam of February of 2022. Some associated hilar adenopathy is noted. Large right breast mass with peripheral enhancement and central necrosis highly suspicious for a breast neoplasm. Diagnostic mammography and tissue sampling is recommended as clinically indicated. CT of the abdomen and pelvis: Cholelithiasis without complicating factors. T11 and L2 compression deformities which appear chronic in nature. No other focal abnormality is noted. Electronically Signed   By: Inez Catalina M.D.   On: 09/21/2020 21:15   DG Chest Portable 1 View  Result Date: 09/21/2020 CLINICAL DATA:   Short of breath, tachycardia EXAM: PORTABLE CHEST 1 VIEW COMPARISON:  04/25/2020 FINDINGS: Left lung grossly clear. Asymmetric opacity in the right thorax likely due to moderate layering effusion. Airspace disease at the right base. Stable cardiomediastinal silhouette. IMPRESSION: Asymmetric opacity in the right thorax suspected to be secondary to at least moderate layering pleural effusion. Airspace disease at right base may reflect atelectasis or pneumonia Electronically Signed   By: Donavan Foil M.D.   On: 09/21/2020 17:30

## 2020-09-22 NOTE — H&P (Signed)
History and Physical    Paula Cantu XBM:[id number] DOB: [birth year] DOA: 09/21/2020  PCP: Trinidad Curet  Patient coming from: [place]  Chief Complaint: Urinary frequency  HPI: Paula Cantu is a 75 y.o. female with medical history significant of breast cancer, cognitive impairment, DM2. Presenting with increased urinary frequency. Patient is non-verbal at baseline. History is from chart review.  She apparently has had several days of malodorous, dark, frequent urine. She had required more and more assistance at her group home. It has been noted that she has been breathing harder and has had a dry cough. The facility became concerned and brought her to the ED.   ED Course: UA was negative. She was afebrile and white count was normal. She was found to be intermittently hypoxic. CTA revealed pleural effusion, pulmonary nodules, and a right breast mass. TRH was called for admission.   Review of Systems:  Unable to obtain d/t mentation.  PMHx Past Medical History:  Diagnosis Date   Arthritis    Cancer ([place])    endometrial   Constipation    Diabetes mellitus without complication ([place])    takes metformin   Frequent falls    Gait disorder    Incontinence of bowel    Incontinence of urine    Intellectual disability    severe intellectual disability, non-verbal   Osteopenia    Seizures ([place])    remote history; possible febrile childhood seizure    PSHx Past Surgical History:  Procedure Laterality Date   ABDOMINAL HYSTERECTOMY     BREAST LUMPECTOMY Left 03/04/2013   Procedure: LEFT BREAST LUMPECTOMY;  Surgeon: [name], MD;  Location: [facility];  Service: General;  Laterality: Left;   RADIOLOGY WITH ANESTHESIA N/A 09/16/2016   Procedure: RADIOLOGY WITH ANESTHESIA MRI OF THE BRAIN WITH AND WITHOUT;  Surgeon: Radiologist, Medication, MD;  Location: [facility];  Service: Radiology;  Laterality: N/A;   RADIOLOGY WITH ANESTHESIA N/A 09/15/2017   Procedure: MRI WITH ANESTHESIA OF THE  BRAIN WITH AND WITHOUT AND CERVICAL WITHOUT CONTRAST;  Surgeon: Radiologist, Medication, MD;  Location: [facility];  Service: Radiology;  Laterality: N/A;    SocHx  reports that she has never smoked. She has never used smokeless tobacco. She reports that she does not drink alcohol and does not use drugs.  Allergies  Allergen Reactions   Lactose Intolerance (Gi) Other (See Comments)    On nursing home records   Bacillus Other (See Comments)    UNSPECIFIED REACTION    Ppd [Tuberculin Purified Protein Derivative] Other (See Comments)    UNSPECIFIED REACTION  On nursing home records    FamHx History reviewed. No pertinent family history.  Prior to Admission medications   Medication Sig Start Date End Date Taking? Authorizing Provider  Cholecalciferol (VITAMIN D3) 50 MCG (2000 UT) capsule Take 4,000 Units by mouth daily.   Yes [provider]  divalproex (DEPAKOTE SPRINKLES) 125 MG capsule Take 2 capsules (250 mg total) by mouth 2 (two) times daily. 11/24/17  Yes Marcial Pacas, MD  menthol-zinc oxide (GOLD BOND) powder Apply 1 application topically 2 (two) times daily. Apply under breasts   Yes [provider]  metFORMIN (GLUCOPHAGE) 850 MG tablet Take 850 mg by mouth daily with breakfast.   Yes [provider]  Omega-3 Fatty Acids (FISH OIL PO) Take 1,200 mg by mouth daily. Fish oil 1600mg / 65mL   --- W/ Food   Yes [provider]  promethazine (PHENERGAN) 25 MG suppository Place  25 mg rectally every 4 (four) hours as needed for vomiting.   Yes [provider]  promethazine (PHENERGAN) 25 MG tablet Take 25 mg by mouth every 4 (four) hours as needed for vomiting. Notify md if symptoms persist over 12 hours   Yes [provider]  sennosides-docusate sodium (SENOKOT-S) 8.6-50 MG tablet Take 2 tablets by mouth at bedtime.   Yes [provider]  tamoxifen (NOLVADEX) 20 MG tablet Take 20 mg by mouth daily.   Yes [provider]   vitamin C (ASCORBIC ACID) 500 MG tablet Take 500 mg by mouth 2 (two) times daily.   Yes [provider]    Physical Exam: Vitals:   09/22/20 0200 09/22/20 0400 09/22/20 0600 09/22/20 0816  BP: 122/63 113/68 (!) 155/80 (!) 160/114  Pulse: (!) 102 91 (!) 106 (!) 122  Resp: (!) 25 20 (!) 22 (!) 25  Temp:      TempSrc:      SpO2: 91% 100% 94% 95%  Weight:      Height:        General: 75 y.o. female resting in bed in NAD Eyes: PERRL, normal sclera ENMT: Nares patent w/o discharge, orophaynx clear, dentition normal, ears w/o discharge/lesions/ulcers Neck: Supple, trachea midline Cardiovascular: tachy, +S1, S2, no m/g/r, equal pulses throughout Respiratory: decreased right base, no w/r/r, somewhat increased WOB GI: BS+, NDNT, no masses noted, no organomegaly noted MSK: No c/c; trace blpedal edema Skin: No rashes, bruises, ulcerations noted Neuro: nonverbal, not following commands  Labs on Admission: I have personally reviewed following labs and imaging studies  CBC: Recent Labs  Lab 09/21/20 1622  WBC 10.4  NEUTROABS 8.1*  HGB 11.8*  HCT 36.7  MCV 94.8  PLT 169   Basic Metabolic Panel: Recent Labs  Lab 09/21/20 1622  NA 139  K 3.8  CL 110  CO2 23  GLUCOSE 112*  BUN 11  CREATININE 0.59  CALCIUM 8.1*   GFR: Estimated Creatinine Clearance: 28.8 mL/min (by C-G formula based on SCr of 0.59 mg/dL). Liver Function Tests: Recent Labs  Lab 09/21/20 1622  AST 17  ALT 12  ALKPHOS 33*  BILITOT 0.5  PROT 5.7*  ALBUMIN 3.0*   Recent Labs  Lab 09/21/20 1622  LIPASE 32   No results for input(s): AMMONIA in the last 168 hours. Coagulation Profile: No results for input(s): INR, PROTIME in the last 168 hours. Cardiac Enzymes: No results for input(s): CKTOTAL, CKMB, CKMBINDEX, TROPONINI in the last 168 hours. BNP (last 3 results) No results for input(s): PROBNP in the last 8760 hours. HbA1C: No results for input(s): HGBA1C in the last 72  hours. CBG: Recent Labs  Lab 09/21/20 1634  GLUCAP 114*   Lipid Profile: No results for input(s): CHOL, HDL, LDLCALC, TRIG, CHOLHDL, LDLDIRECT in the last 72 hours. Thyroid Function Tests: No results for input(s): TSH, T4TOTAL, FREET4, T3FREE, THYROIDAB in the last 72 hours. Anemia Panel: No results for input(s): VITAMINB12, FOLATE, FERRITIN, TIBC, IRON, RETICCTPCT in the last 72 hours. Urine analysis:    Component Value Date/Time   COLORURINE STRAW (A) 09/21/2020 1937   APPEARANCEUR CLEAR 09/21/2020 1937   LABSPEC 1.006 09/21/2020 1937   PHURINE 6.0 09/21/2020 1937   GLUCOSEU NEGATIVE 09/21/2020 1937   HGBUR NEGATIVE 09/21/2020 1937   BILIRUBINUR NEGATIVE 09/21/2020 1937   KETONESUR 5 (A) 09/21/2020 1937   PROTEINUR NEGATIVE 09/21/2020 1937   NITRITE NEGATIVE 09/21/2020 1937   LEUKOCYTESUR NEGATIVE 09/21/2020 1937    Radiological Exams on  Admission: CT Angio Chest PE W/Cm &/Or Wo Cm  Result Date: 09/21/2020 CLINICAL DATA:  Chest and abdominal pain with foul-smelling urine, initial encounter EXAM: CT ANGIOGRAPHY CHEST CT ABDOMEN AND PELVIS WITH CONTRAST TECHNIQUE: Multidetector CT imaging of the chest was performed using the standard protocol during bolus administration of intravenous contrast. Multiplanar CT image reconstructions and MIPs were obtained to evaluate the vascular anatomy. Multidetector CT imaging of the abdomen and pelvis was performed using the standard protocol during bolus administration of intravenous contrast. CONTRAST:  11mL OMNIPAQUE IOHEXOL 350 MG/ML SOLN COMPARISON:  Chest x-ray from earlier in the same day, CT from 04/25/2020 FINDINGS: CTA CHEST FINDINGS Cardiovascular: Thoracic aorta demonstrates mild atherosclerotic calcifications. No aneurysmal dilatation or dissection is noted. The heart is not significantly enlarged in size. The pulmonary artery shows a normal branching pattern bilaterally. No filling defect to suggest pulmonary embolism is seen. Coronary  calcifications are noted. Mediastinum/Nodes: Thoracic inlet is within normal limits. Scattered noncalcified hilar lymph nodes are noted bilaterally. The largest of these on the left measures approximately 12 mm in short axis. Largest of these on the right measures approximately 11 mm in short axis. A few calcified lymph nodes are noted within the right hilum likely related to prior granulomatous disease. The esophagus as visualized appears within normal limits. Lungs/Pleura: Large right-sided pleural effusion is noted. The residual right lung is well aerated with scattered pulmonary nodules identified. The largest of these lies in the right upper lobe best seen on image number 44 of series 4 measuring approximately 7 mm. This was not well appreciated on the recent exam from February of this year. Consolidation in the right lower lobe is noted related to the underlying effusion. Multiple nodular densities are noted throughout the left lower lobe which have increased in the interval from the prior exam. The largest of these is noted on image number 68 of series 4. This nodule was not present on the prior exam. No sizable effusion on the left is noted. Scattered smaller left-sided nodules are seen. The previously seen lower lobe nodule has increased in size now measuring approximately 10 mm in dimension. Musculoskeletal: Degenerative changes of the thoracic spine are noted. No discrete lytic or sclerotic lesions are seen. There is however a soft tissue mass lesion in the right breast which was not well appreciated on the prior exam due to the imaging technique. This measures approximately 7.0 x 4.5 cm in greatest dimension. Delayed images demonstrates some peripheral enhancement particularly inferiorly with central necrosis. This is suspicious for underlying breast neoplasm until proven otherwise. No sizable associated adenopathy is noted. Review of the MIP images confirms the above findings. CT ABDOMEN and PELVIS  FINDINGS Hepatobiliary: Liver is well visualized and mildly fatty infiltrated. No focal mass is seen. Changes of cholelithiasis are seen without complicating factors. Pancreas: Unremarkable. No pancreatic ductal dilatation or surrounding inflammatory changes. Spleen: Normal in size without focal abnormality. Adrenals/Urinary Tract: Adrenal glands are within normal limits. Kidneys demonstrate a normal enhancement pattern bilaterally. No renal calculi or obstructive changes are seen. The ureters are within normal limits. The bladder is well distended. Stomach/Bowel: Colon shows no obstructive or inflammatory changes. Visualized small bowel is within normal limits. The appendix is not well appreciated. No inflammatory changes to suggest appendicitis are noted. Small bowel and stomach are unremarkable. Vascular/Lymphatic: Aortic atherosclerosis. No enlarged abdominal or pelvic lymph nodes. Reproductive: Status post hysterectomy. No adnexal masses. Other: No abdominal wall hernia or abnormality. No abdominopelvic ascites. Musculoskeletal: Compression deformities of  T11 and L2 are seen. These appear chronic in nature. No lytic or sclerotic lesion is seen. Review of the MIP images confirms the above findings. IMPRESSION: CTA of the chest: No evidence of pulmonary emboli. Large right-sided pleural effusion. Multiple pulmonary nodules are noted bilaterally suspicious for metastatic disease. These have increased in both size and number when compare with the prior exam of February of 2022. Some associated hilar adenopathy is noted. Large right breast mass with peripheral enhancement and central necrosis highly suspicious for a breast neoplasm. Diagnostic mammography and tissue sampling is recommended as clinically indicated. CT of the abdomen and pelvis: Cholelithiasis without complicating factors. T11 and L2 compression deformities which appear chronic in nature. No other focal abnormality is noted. Electronically Signed    By: Inez Catalina M.D.   On: 09/21/2020 21:15   CT Abdomen Pelvis W Contrast  Result Date: 09/21/2020 CLINICAL DATA:  Chest and abdominal pain with foul-smelling urine, initial encounter EXAM: CT ANGIOGRAPHY CHEST CT ABDOMEN AND PELVIS WITH CONTRAST TECHNIQUE: Multidetector CT imaging of the chest was performed using the standard protocol during bolus administration of intravenous contrast. Multiplanar CT image reconstructions and MIPs were obtained to evaluate the vascular anatomy. Multidetector CT imaging of the abdomen and pelvis was performed using the standard protocol during bolus administration of intravenous contrast. CONTRAST:  133mL OMNIPAQUE IOHEXOL 350 MG/ML SOLN COMPARISON:  Chest x-ray from earlier in the same day, CT from 04/25/2020 FINDINGS: CTA CHEST FINDINGS Cardiovascular: Thoracic aorta demonstrates mild atherosclerotic calcifications. No aneurysmal dilatation or dissection is noted. The heart is not significantly enlarged in size. The pulmonary artery shows a normal branching pattern bilaterally. No filling defect to suggest pulmonary embolism is seen. Coronary calcifications are noted. Mediastinum/Nodes: Thoracic inlet is within normal limits. Scattered noncalcified hilar lymph nodes are noted bilaterally. The largest of these on the left measures approximately 12 mm in short axis. Largest of these on the right measures approximately 11 mm in short axis. A few calcified lymph nodes are noted within the right hilum likely related to prior granulomatous disease. The esophagus as visualized appears within normal limits. Lungs/Pleura: Large right-sided pleural effusion is noted. The residual right lung is well aerated with scattered pulmonary nodules identified. The largest of these lies in the right upper lobe best seen on image number 44 of series 4 measuring approximately 7 mm. This was not well appreciated on the recent exam from February of this year. Consolidation in the right lower lobe is  noted related to the underlying effusion. Multiple nodular densities are noted throughout the left lower lobe which have increased in the interval from the prior exam. The largest of these is noted on image number 68 of series 4. This nodule was not present on the prior exam. No sizable effusion on the left is noted. Scattered smaller left-sided nodules are seen. The previously seen lower lobe nodule has increased in size now measuring approximately 10 mm in dimension. Musculoskeletal: Degenerative changes of the thoracic spine are noted. No discrete lytic or sclerotic lesions are seen. There is however a soft tissue mass lesion in the right breast which was not well appreciated on the prior exam due to the imaging technique. This measures approximately 7.0 x 4.5 cm in greatest dimension. Delayed images demonstrates some peripheral enhancement particularly inferiorly with central necrosis. This is suspicious for underlying breast neoplasm until proven otherwise. No sizable associated adenopathy is noted. Review of the MIP images confirms the above findings. CT ABDOMEN and PELVIS FINDINGS Hepatobiliary:  Liver is well visualized and mildly fatty infiltrated. No focal mass is seen. Changes of cholelithiasis are seen without complicating factors. Pancreas: Unremarkable. No pancreatic ductal dilatation or surrounding inflammatory changes. Spleen: Normal in size without focal abnormality. Adrenals/Urinary Tract: Adrenal glands are within normal limits. Kidneys demonstrate a normal enhancement pattern bilaterally. No renal calculi or obstructive changes are seen. The ureters are within normal limits. The bladder is well distended. Stomach/Bowel: Colon shows no obstructive or inflammatory changes. Visualized small bowel is within normal limits. The appendix is not well appreciated. No inflammatory changes to suggest appendicitis are noted. Small bowel and stomach are unremarkable. Vascular/Lymphatic: Aortic atherosclerosis.  No enlarged abdominal or pelvic lymph nodes. Reproductive: Status post hysterectomy. No adnexal masses. Other: No abdominal wall hernia or abnormality. No abdominopelvic ascites. Musculoskeletal: Compression deformities of T11 and L2 are seen. These appear chronic in nature. No lytic or sclerotic lesion is seen. Review of the MIP images confirms the above findings. IMPRESSION: CTA of the chest: No evidence of pulmonary emboli. Large right-sided pleural effusion. Multiple pulmonary nodules are noted bilaterally suspicious for metastatic disease. These have increased in both size and number when compare with the prior exam of February of 2022. Some associated hilar adenopathy is noted. Large right breast mass with peripheral enhancement and central necrosis highly suspicious for a breast neoplasm. Diagnostic mammography and tissue sampling is recommended as clinically indicated. CT of the abdomen and pelvis: Cholelithiasis without complicating factors. T11 and L2 compression deformities which appear chronic in nature. No other focal abnormality is noted. Electronically Signed   By: Inez Catalina M.D.   On: 09/21/2020 21:15   DG Chest Portable 1 View  Result Date: 09/21/2020 CLINICAL DATA:  Short of breath, tachycardia EXAM: PORTABLE CHEST 1 VIEW COMPARISON:  04/25/2020 FINDINGS: Left lung grossly clear. Asymmetric opacity in the right thorax likely due to moderate layering effusion. Airspace disease at the right base. Stable cardiomediastinal silhouette. IMPRESSION: Asymmetric opacity in the right thorax suspected to be secondary to at least moderate layering pleural effusion. Airspace disease at right base may reflect atelectasis or pneumonia Electronically Signed   By: Donavan Foil M.D.   On: 09/21/2020 17:30    EKG: Independently reviewed. Sinus tach, no st elevations  Assessment/Plan Acute hypoxic respiratory failure Pleural effusion     - admit to inpt, tele     - breathing tx, supplemental O2     -  will consult IR for thoracentesis; studies ordered     - CTA also shows new pulm nodules and a necrotic right breast mass     - check CTH for staging     - spoke with onco; agrees w/ above plan, they will see her  Sinus tach     - secondary to above; follow  Severe intellectual disability     - nonverbal at baseline     - can be combative at times; continue home regimen     - PRN ativan available     - consents will need to be made through HCPOA (cousin listen in chart); I have updated HCPOA w/ plan  DM2     - DM diet, SSI, glucose checks, A1c  Hx of breast CA     - see above  DVT prophylaxis: SCDs  Code Status: FULL  Family Communication: w/ [facility]CPOA by phone  Consults called: Oncology (Dr. Alvy Bimler)   Status is: Inpatient  Remains inpatient appropriate because:Inpatient level of care appropriate due to severity of illness  Dispo:  The patient is from: Group home              Anticipated d/c is to: Group home              Patient currently is not medically stable to d/c.   Difficult to place patient No  Time spent coordinating admission: 70 minutes  [facility]  If 7PM-7AM, please contact night-coverage [email or web address]  09/22/2020, 9:01 AM

## 2020-09-23 DIAGNOSIS — J90 Pleural effusion, not elsewhere classified: Secondary | ICD-10-CM | POA: Diagnosis not present

## 2020-09-23 DIAGNOSIS — J910 Malignant pleural effusion: Secondary | ICD-10-CM | POA: Diagnosis not present

## 2020-09-23 DIAGNOSIS — C50912 Malignant neoplasm of unspecified site of left female breast: Secondary | ICD-10-CM | POA: Diagnosis not present

## 2020-09-23 DIAGNOSIS — J9690 Respiratory failure, unspecified, unspecified whether with hypoxia or hypercapnia: Secondary | ICD-10-CM | POA: Diagnosis not present

## 2020-09-23 DIAGNOSIS — R000 Tachycardia, unspecified: Secondary | ICD-10-CM

## 2020-09-23 DIAGNOSIS — C7800 Secondary malignant neoplasm of unspecified lung: Secondary | ICD-10-CM | POA: Diagnosis not present

## 2020-09-23 LAB — CBC
HCT: 37.8 % (ref 36.0–46.0)
Hemoglobin: 12.1 g/dL (ref 12.0–15.0)
MCH: 30.3 pg (ref 26.0–34.0)
MCHC: 32 g/dL (ref 30.0–36.0)
MCV: 94.5 fL (ref 80.0–100.0)
Platelets: 201 10*3/uL (ref 150–400)
RBC: 4 MIL/uL (ref 3.87–5.11)
RDW: 13.6 % (ref 11.5–15.5)
WBC: 8.1 10*3/uL (ref 4.0–10.5)
nRBC: 0 % (ref 0.0–0.2)

## 2020-09-23 LAB — COMPREHENSIVE METABOLIC PANEL
ALT: 20 U/L (ref 0–44)
AST: 25 U/L (ref 15–41)
Albumin: 2.8 g/dL — ABNORMAL LOW (ref 3.5–5.0)
Alkaline Phosphatase: 35 U/L — ABNORMAL LOW (ref 38–126)
Anion gap: 5 (ref 5–15)
BUN: 7 mg/dL — ABNORMAL LOW (ref 8–23)
CO2: 29 mmol/L (ref 22–32)
Calcium: 8.1 mg/dL — ABNORMAL LOW (ref 8.9–10.3)
Chloride: 109 mmol/L (ref 98–111)
Creatinine, Ser: 0.58 mg/dL (ref 0.44–1.00)
GFR, Estimated: >60 mL/min (ref >60)
Glucose, Bld: 100 mg/dL — ABNORMAL HIGH (ref 70–99)
Potassium: 3.9 mmol/L (ref 3.5–5.1)
Sodium: 143 mmol/L (ref 135–145)
Total Bilirubin: 0.5 mg/dL (ref 0.3–1.2)
Total Protein: 5.4 g/dL — ABNORMAL LOW (ref 6.5–8.1)

## 2020-09-23 MED ORDER — METOPROLOL TARTRATE 5 MG/5ML IV SOLN
5.0000 mg | Freq: Four times a day (QID) | INTRAVENOUS | Status: DC | PRN
  Administered 2020-09-23 – 2020-09-26 (×2): 5 mg via INTRAVENOUS
  Filled 2020-09-23 (×2): qty 5

## 2020-09-23 NOTE — Progress Notes (Signed)
PROGRESS NOTE    Paula Cantu  XFG:[id number] DOB: Dec 29, 1945 DOA: 09/21/2020 PCP: Trinidad Curet   Brief Narrative:  This 75 years old female with PMH significant for metastatic breast cancer, cognitive impairment, type 2 diabetes presented in the ED with increased urinary frequency.  Patient is nonverbal at baseline.She had several days of malodorous dark frequent urine..  She had required more assistance at her group home.  She is also found to have difficulty breathing and dry cough.  Patient was brought in the ED.  UA was negative,  Patient was found to be intermittently hypoxic.CTA chest ruled out pulmonary embolism but found to have pulmonary nodules,  pleural effusion and right breast mass.  Assessment & Plan:   Active Problems:   Pleural effusion   Acute hypoxic respiratory failure secondary to pulmonary effusion. Patient was found to have intermittent hypoxia.  She was 87% on room air. O2 saturation improved to 96% on 2 L supplemental oxygen. Continue supplemental oxygen to keep saturation above 94%. IR consulted for thoracocentesis. Since this is presumed malignant effusion and would likely reaccumulate.  IR suggested for pigtail chest tube on Monday. Depend upon the cytology and how much/foster fluid reaccumulate she may need to be transition to tunneled pleural catheter Pleurx and transition to hospice or palliative care.  Sinus tachycardia: Start metoprolol 5 mg IV push every 6 hours as needed.  Severe intellectual disability.: Patient is nonverbal at baseline. She does have some intermittent agitation. Continue Ativan as needed.  Home medications.  Type 2 diabetes: Continue sliding scale.  History of breast cancer: Oncology consulted.  Patient has a stage IV breast cancer which is noncurable. Palliative care consulted to discuss goals of care.  DVT prophylaxis: SCDs Code Status: Full code Family Communication: No family at bed side. Disposition Plan:    Status is: Inpatient  Remains inpatient appropriate because:Inpatient level of care appropriate due to severity of illness  Dispo: The patient is from:  [place]              Anticipated d/c is to: Group Home              Patient currently is not medically stable to d/c.   Difficult to place patient No  Consultants:  Oncology Palliative care.  Procedures:  Antimicrobials:   Anti-infectives (From admission, onward)    None       Subjective: Patient was seen and examined at bedside.  Overnight events noted.  Patient is nonverbal but lies comfortably. She does not seem to to have distress.  Objective: Vitals:   09/23/20 0132 09/23/20 0134 09/23/20 0456 09/23/20 1252  BP: 96/73  (!) 155/87 (!) 126/109  Pulse: (!) 114 (!) 108 (!) 120 (!) 103  Resp: 19  19 20   Temp: 97.7 F (36.5 C)  97.9 F (36.6 C) 97.8 F (36.6 C)  TempSrc: Axillary  Axillary   SpO2:  96% 92% 96%  Weight:      Height:        Intake/Output Summary (Last 24 hours) at 09/23/2020 1448 Last data filed at 09/23/2020 0507 Gross per 24 hour  Intake 360 ml  Output 150 ml  Net 210 ml   Filed Weights   09/21/20 1608 09/22/20 1635  Weight: 47.2 kg 57.7 kg    Examination:  General exam: Appears calm and comfortable , not in any acute distress. Respiratory system: Clear to auscultation. Respiratory effort normal. Cardiovascular system: S1 & S2 heard, RRR. No JVD, murmurs,  rubs, gallops or clicks. No pedal edema. Gastrointestinal system: Abdomen is nondistended, soft and nontender. No organomegaly or masses felt. Normal bowel sounds heard. Central nervous system: Alert and oriented. No focal neurological deficits. Extremities: Symmetric 5 x 5 power. Skin: No rashes, lesions or ulcers Psychiatry: Judgement and insight appear normal. Mood & affect appropriate.     Data Reviewed: I have personally reviewed following labs and imaging studies  CBC: Recent Labs  Lab 09/21/20 1622 09/22/20 1008  09/23/20 0317  WBC 10.4 9.4 8.1  NEUTROABS 8.1* 5.8  --   HGB 11.8* 12.5 12.1  HCT 36.7 39.7 37.8  MCV 94.8 95.9 94.5  PLT 217 216 626   Basic Metabolic Panel: Recent Labs  Lab 09/21/20 1622 09/22/20 1008 09/23/20 0317  NA 139 142 143  K 3.8 3.9 3.9  CL 110 110 109  CO2 23 25 29   GLUCOSE 112* 100* 100*  BUN 11 8 7*  CREATININE 0.59 0.53 0.58  CALCIUM 8.1* 8.1* 8.1*  MG  --  2.0  --    GFR: Estimated Creatinine Clearance: 32.9 mL/min (by C-G formula based on SCr of 0.58 mg/dL). Liver Function Tests: Recent Labs  Lab 09/21/20 1622 09/22/20 1008 09/23/20 0317  AST 17 21 25   ALT 12 16 20   ALKPHOS 33* 36* 35*  BILITOT 0.5 0.5 0.5  PROT 5.7* 5.8* 5.4*  ALBUMIN 3.0* 3.0* 2.8*   Recent Labs  Lab 09/21/20 1622  LIPASE 32   No results for input(s): AMMONIA in the last 168 hours. Coagulation Profile: Recent Labs  Lab 09/22/20 1008  INR 1.0   Cardiac Enzymes: No results for input(s): CKTOTAL, CKMB, CKMBINDEX, TROPONINI in the last 168 hours. BNP (last 3 results) No results for input(s): PROBNP in the last 8760 hours. HbA1C: No results for input(s): HGBA1C in the last 72 hours. CBG: Recent Labs  Lab 09/21/20 1634  GLUCAP 114*   Lipid Profile: No results for input(s): CHOL, HDL, LDLCALC, TRIG, CHOLHDL, LDLDIRECT in the last 72 hours. Thyroid Function Tests: No results for input(s): TSH, T4TOTAL, FREET4, T3FREE, THYROIDAB in the last 72 hours. Anemia Panel: No results for input(s): VITAMINB12, FOLATE, FERRITIN, TIBC, IRON, RETICCTPCT in the last 72 hours. Sepsis Labs: Recent Labs  Lab 09/21/20 1622  LATICACIDVEN 1.4    Recent Results (from the past 240 hour(s))  Blood culture (routine x 2)     Status: None (Preliminary result)   Collection Time: 09/21/20  4:22 PM   Specimen: BLOOD  Result Value Ref Range Status   Specimen Description   Final    BLOOD RIGHT ANTECUBITAL Performed at [facility] [address] [place],  [place] [address]    Special Requests   Final    BOTTLES DRAWN AEROBIC AND ANAEROBIC Blood Culture adequate volume Performed at [facility] [address] [place], [place] [address]    Culture   Final    NO GROWTH 2 DAYS Performed at [facility] [address] [place], [place] [address]    Report Status PENDING  Incomplete  Blood culture (routine x 2)     Status: None (Preliminary result)   Collection Time: 09/21/20  4:27 PM   Specimen: BLOOD  Result Value Ref Range Status   Specimen Description   Final    BLOOD LEFT ANTECUBITAL Performed at [facility] [address], [place], [place] [address]    Special Requests   Final    BOTTLES DRAWN AEROBIC AND  ANAEROBIC Blood Culture adequate volume Performed at [facility] [address] [place], [place] [address]    Culture   Final    NO GROWTH 2 DAYS Performed at [facility] [address], [place], [place] [address]    Report Status PENDING  Incomplete  Resp Panel by RT-PCR (Flu A&B, Covid) Nasopharyngeal Swab     Status: None   Collection Time: 09/21/20  7:04 PM   Specimen: Nasopharyngeal Swab; Nasopharyngeal(NP) swabs in vial transport medium  Result Value Ref Range Status   SARS Coronavirus 2 by RT PCR NEGATIVE NEGATIVE Final    Comment: (NOTE) SARS-CoV-2 target nucleic acids are NOT DETECTED.  The SARS-CoV-2 RNA is generally detectable in upper respiratory specimens during the acute phase of infection. The lowest concentration of SARS-CoV-2 viral copies this assay can detect is 138 copies/mL. A negative result does not preclude SARS-Cov-2 infection and should not be used as the sole basis for treatment or other patient management decisions. A negative result may occur with  improper specimen collection/handling, submission of specimen other than nasopharyngeal swab, presence of viral mutation(s) within the areas targeted by this assay, and inadequate  number of viral copies(<138 copies/mL). A negative result must be combined with clinical observations, patient history, and epidemiological information. The expected result is Negative.  Fact Sheet for Patients:  EntrepreneurPulse.com.au  Fact Sheet for Healthcare Providers:  IncredibleEmployment.be  This test is no t yet approved or cleared by the [place] FDA and  has been authorized for detection and/or diagnosis of SARS-CoV-2 by FDA under an Emergency Use Authorization (EUA). This EUA will remain  in effect (meaning this test can be used) for the duration of the COVID-19 declaration under Section 564(b)(1) of the Act, 21 U.S.C.section 360bbb-3(b)(1), unless the authorization is terminated  or revoked sooner.       Influenza A by PCR NEGATIVE NEGATIVE Final   Influenza B by PCR NEGATIVE NEGATIVE Final    Comment: (NOTE) The Xpert Xpress SARS-CoV-2/FLU/RSV plus assay is intended as an aid in the diagnosis of influenza from Nasopharyngeal swab specimens and should not be used as a sole basis for treatment. Nasal washings and aspirates are unacceptable for Xpert Xpress SARS-CoV-2/FLU/RSV testing.  Fact Sheet for Patients: EntrepreneurPulse.com.au  Fact Sheet for Healthcare Providers: IncredibleEmployment.be  This test is not yet approved or cleared by the [place] FDA and has been authorized for detection and/or diagnosis of SARS-CoV-2 by FDA under an Emergency Use Authorization (EUA). This EUA will remain in effect (meaning this test can be used) for the duration of the COVID-19 declaration under Section 564(b)(1) of the Act, 21 U.S.C. section 360bbb-3(b)(1), unless the authorization is terminated or revoked.  Performed at [facility], [place] [address], [place], [place] [address]   Urine culture     Status: Abnormal (Preliminary result)   Collection Time: 09/21/20  7:37  PM   Specimen: Urine, Random  Result Value Ref Range Status   Specimen Description   Final    URINE, RANDOM Performed at [facility] [address], [place], [place] [address]    Special Requests   Final    NONE Performed at [facility], [place] [address], [place], [place] [address]    Culture (A)  Final    10,000 COLONIES/mL GRAM NEGATIVE RODS SUSCEPTIBILITIES TO FOLLOW Performed at [facility], [place] [address], [place], [place] [address]    Report Status PENDING  Incomplete  Radiology Studies: CT Angio Chest PE W/Cm &/Or Wo Cm  Result Date: 09/21/2020 CLINICAL DATA:  Chest and abdominal pain with foul-smelling urine, initial encounter EXAM: CT ANGIOGRAPHY CHEST CT ABDOMEN AND PELVIS WITH CONTRAST TECHNIQUE: Multidetector CT imaging of the chest was performed using the standard protocol during bolus administration of intravenous contrast. Multiplanar CT image reconstructions and MIPs were obtained to evaluate the vascular anatomy. Multidetector CT imaging of the abdomen and pelvis was performed using the standard protocol during bolus administration of intravenous contrast. CONTRAST:  146mL OMNIPAQUE IOHEXOL 350 MG/ML SOLN COMPARISON:  Chest x-ray from earlier in the same day, CT from 04/25/2020 FINDINGS: CTA CHEST FINDINGS Cardiovascular: Thoracic aorta demonstrates mild atherosclerotic calcifications. No aneurysmal dilatation or dissection is noted. The heart is not significantly enlarged in size. The pulmonary artery shows a normal branching pattern bilaterally. No filling defect to suggest pulmonary embolism is seen. Coronary calcifications are noted. Mediastinum/Nodes: Thoracic inlet is within normal limits. Scattered noncalcified hilar lymph nodes are noted bilaterally. The largest of these on the left measures approximately 12 mm in short axis. Largest of these on the right measures approximately 11 mm in short axis. A few calcified lymph  nodes are noted within the right hilum likely related to prior granulomatous disease. The esophagus as visualized appears within normal limits. Lungs/Pleura: Large right-sided pleural effusion is noted. The residual right lung is well aerated with scattered pulmonary nodules identified. The largest of these lies in the right upper lobe best seen on image number 44 of series 4 measuring approximately 7 mm. This was not well appreciated on the recent exam from February of this year. Consolidation in the right lower lobe is noted related to the underlying effusion. Multiple nodular densities are noted throughout the left lower lobe which have increased in the interval from the prior exam. The largest of these is noted on image number 68 of series 4. This nodule was not present on the prior exam. No sizable effusion on the left is noted. Scattered smaller left-sided nodules are seen. The previously seen lower lobe nodule has increased in size now measuring approximately 10 mm in dimension. Musculoskeletal: Degenerative changes of the thoracic spine are noted. No discrete lytic or sclerotic lesions are seen. There is however a soft tissue mass lesion in the right breast which was not well appreciated on the prior exam due to the imaging technique. This measures approximately 7.0 x 4.5 cm in greatest dimension. Delayed images demonstrates some peripheral enhancement particularly inferiorly with central necrosis. This is suspicious for underlying breast neoplasm until proven otherwise. No sizable associated adenopathy is noted. Review of the MIP images confirms the above findings. CT ABDOMEN and PELVIS FINDINGS Hepatobiliary: Liver is well visualized and mildly fatty infiltrated. No focal mass is seen. Changes of cholelithiasis are seen without complicating factors. Pancreas: Unremarkable. No pancreatic ductal dilatation or surrounding inflammatory changes. Spleen: Normal in size without focal abnormality. Adrenals/Urinary  Tract: Adrenal glands are within normal limits. Kidneys demonstrate a normal enhancement pattern bilaterally. No renal calculi or obstructive changes are seen. The ureters are within normal limits. The bladder is well distended. Stomach/Bowel: Colon shows no obstructive or inflammatory changes. Visualized small bowel is within normal limits. The appendix is not well appreciated. No inflammatory changes to suggest appendicitis are noted. Small bowel and stomach are unremarkable. Vascular/Lymphatic: Aortic atherosclerosis. No enlarged abdominal or pelvic lymph nodes. Reproductive: Status post hysterectomy. No adnexal masses. Other: No abdominal wall hernia or abnormality. No abdominopelvic ascites. Musculoskeletal: Compression deformities  of T11 and L2 are seen. These appear chronic in nature. No lytic or sclerotic lesion is seen. Review of the MIP images confirms the above findings. IMPRESSION: CTA of the chest: No evidence of pulmonary emboli. Large right-sided pleural effusion. Multiple pulmonary nodules are noted bilaterally suspicious for metastatic disease. These have increased in both size and number when compare with the prior exam of February of 2022. Some associated hilar adenopathy is noted. Large right breast mass with peripheral enhancement and central necrosis highly suspicious for a breast neoplasm. Diagnostic mammography and tissue sampling is recommended as clinically indicated. CT of the abdomen and pelvis: Cholelithiasis without complicating factors. T11 and L2 compression deformities which appear chronic in nature. No other focal abnormality is noted. Electronically Signed   By: Inez Catalina M.D.   On: 09/21/2020 21:15   CT Abdomen Pelvis W Contrast  Result Date: 09/21/2020 CLINICAL DATA:  Chest and abdominal pain with foul-smelling urine, initial encounter EXAM: CT ANGIOGRAPHY CHEST CT ABDOMEN AND PELVIS WITH CONTRAST TECHNIQUE: Multidetector CT imaging of the chest was performed using the  standard protocol during bolus administration of intravenous contrast. Multiplanar CT image reconstructions and MIPs were obtained to evaluate the vascular anatomy. Multidetector CT imaging of the abdomen and pelvis was performed using the standard protocol during bolus administration of intravenous contrast. CONTRAST:  120mL OMNIPAQUE IOHEXOL 350 MG/ML SOLN COMPARISON:  Chest x-ray from earlier in the same day, CT from 04/25/2020 FINDINGS: CTA CHEST FINDINGS Cardiovascular: Thoracic aorta demonstrates mild atherosclerotic calcifications. No aneurysmal dilatation or dissection is noted. The heart is not significantly enlarged in size. The pulmonary artery shows a normal branching pattern bilaterally. No filling defect to suggest pulmonary embolism is seen. Coronary calcifications are noted. Mediastinum/Nodes: Thoracic inlet is within normal limits. Scattered noncalcified hilar lymph nodes are noted bilaterally. The largest of these on the left measures approximately 12 mm in short axis. Largest of these on the right measures approximately 11 mm in short axis. A few calcified lymph nodes are noted within the right hilum likely related to prior granulomatous disease. The esophagus as visualized appears within normal limits. Lungs/Pleura: Large right-sided pleural effusion is noted. The residual right lung is well aerated with scattered pulmonary nodules identified. The largest of these lies in the right upper lobe best seen on image number 44 of series 4 measuring approximately 7 mm. This was not well appreciated on the recent exam from February of this year. Consolidation in the right lower lobe is noted related to the underlying effusion. Multiple nodular densities are noted throughout the left lower lobe which have increased in the interval from the prior exam. The largest of these is noted on image number 68 of series 4. This nodule was not present on the prior exam. No sizable effusion on the left is noted.  Scattered smaller left-sided nodules are seen. The previously seen lower lobe nodule has increased in size now measuring approximately 10 mm in dimension. Musculoskeletal: Degenerative changes of the thoracic spine are noted. No discrete lytic or sclerotic lesions are seen. There is however a soft tissue mass lesion in the right breast which was not well appreciated on the prior exam due to the imaging technique. This measures approximately 7.0 x 4.5 cm in greatest dimension. Delayed images demonstrates some peripheral enhancement particularly inferiorly with central necrosis. This is suspicious for underlying breast neoplasm until proven otherwise. No sizable associated adenopathy is noted. Review of the MIP images confirms the above findings. CT ABDOMEN and PELVIS FINDINGS  Hepatobiliary: Liver is well visualized and mildly fatty infiltrated. No focal mass is seen. Changes of cholelithiasis are seen without complicating factors. Pancreas: Unremarkable. No pancreatic ductal dilatation or surrounding inflammatory changes. Spleen: Normal in size without focal abnormality. Adrenals/Urinary Tract: Adrenal glands are within normal limits. Kidneys demonstrate a normal enhancement pattern bilaterally. No renal calculi or obstructive changes are seen. The ureters are within normal limits. The bladder is well distended. Stomach/Bowel: Colon shows no obstructive or inflammatory changes. Visualized small bowel is within normal limits. The appendix is not well appreciated. No inflammatory changes to suggest appendicitis are noted. Small bowel and stomach are unremarkable. Vascular/Lymphatic: Aortic atherosclerosis. No enlarged abdominal or pelvic lymph nodes. Reproductive: Status post hysterectomy. No adnexal masses. Other: No abdominal wall hernia or abnormality. No abdominopelvic ascites. Musculoskeletal: Compression deformities of T11 and L2 are seen. These appear chronic in nature. No lytic or sclerotic lesion is seen.  Review of the MIP images confirms the above findings. IMPRESSION: CTA of the chest: No evidence of pulmonary emboli. Large right-sided pleural effusion. Multiple pulmonary nodules are noted bilaterally suspicious for metastatic disease. These have increased in both size and number when compare with the prior exam of February of 2022. Some associated hilar adenopathy is noted. Large right breast mass with peripheral enhancement and central necrosis highly suspicious for a breast neoplasm. Diagnostic mammography and tissue sampling is recommended as clinically indicated. CT of the abdomen and pelvis: Cholelithiasis without complicating factors. T11 and L2 compression deformities which appear chronic in nature. No other focal abnormality is noted. Electronically Signed   By: Inez Catalina M.D.   On: 09/21/2020 21:15   DG Chest Portable 1 View  Result Date: 09/21/2020 CLINICAL DATA:  Short of breath, tachycardia EXAM: PORTABLE CHEST 1 VIEW COMPARISON:  04/25/2020 FINDINGS: Left lung grossly clear. Asymmetric opacity in the right thorax likely due to moderate layering effusion. Airspace disease at the right base. Stable cardiomediastinal silhouette. IMPRESSION: Asymmetric opacity in the right thorax suspected to be secondary to at least moderate layering pleural effusion. Airspace disease at right base may reflect atelectasis or pneumonia Electronically Signed   By: Donavan Foil M.D.   On: 09/21/2020 17:30     Scheduled Meds:  vitamin C  500 mg Oral BID   cholecalciferol  4,000 Units Oral Daily   divalproex  250 mg Oral BID   omega-3 acid ethyl esters  1 g Oral Daily   senna-docusate  2 tablet Oral QHS   tamoxifen  20 mg Oral Daily   Continuous Infusions:   LOS: 2 days    Time spent: 35 mins    Shakiya Mcneary, MD [facility]   If 7PM-7AM, please contact night-coverage

## 2020-09-23 NOTE — Progress Notes (Signed)
Paula Cantu   DOB:1946/02/08   JJ#:[id number]    ASSESSMENT & PLAN:  Metastatic breast cancer to the lung until proven otherwise Right pleural effusion, likely malignant in nature She has palpable right breast mass, evident on CT imaging The patient likely has progressed on tamoxifen Awaiting diagnostic and therapeutic thoracentesis  Dr. Jana Hakim will resume care next week  mild respiratory failure secondary to pleural effusion Tachycardia Likely secondary to malignant effusion Appreciate IR consult Currently, she is stable   Goals of care discussion I told the caregiver that the patient have stage IV disease which is incurable Given the nature of her nonverbal/intellectual disability, the patient will not be able to make any form of informed decision about treatment options Her healthcare power of attorney does not want to make any decision this weekend I did suggest palliative care consult but she would like to hold off until she has a chance to talk to Dr. Jana Hakim on Monday   Discharge planning She will likely be here over the weekend Dr. Jana Hakim will round on her next week Please call if questions arise  All questions were answered. The patient knows to call the clinic with any problems, questions or concerns.   The total time spent in the appointment was 15 minutes encounter with patients including review of chart and various tests results, discussions about plan of care and coordination of care plan  Heath Lark, MD 09/23/2020 10:00 AM  Subjective:  The patient is awake but noncommunicative I reviewed her electronic records  Objective:  Vitals:   09/23/20 0134 09/23/20 0456  BP:  (!) 155/87  Pulse: (!) 108 (!) 120  Resp:  19  Temp:  97.9 F (36.6 C)  SpO2: 96% 92%     Intake/Output Summary (Last 24 hours) at 09/23/2020 1000 Last data filed at 09/23/2020 0507 Gross per 24 hour  Intake 360 ml  Output 150 ml  Net 210 ml    GENERAL:alert, no distress and  comfortable   Labs:  Recent Labs    09/21/20 1622 09/22/20 1008 09/23/20 0317  NA 139 142 143  K 3.8 3.9 3.9  CL 110 110 109  CO2 23 25 29   GLUCOSE 112* 100* 100*  BUN 11 8 7*  CREATININE 0.59 0.53 0.58  CALCIUM 8.1* 8.1* 8.1*  GFRNONAA >60 >60 >60  PROT 5.7* 5.8* 5.4*  ALBUMIN 3.0* 3.0* 2.8*  AST 17 21 25   ALT 12 16 20   ALKPHOS 33* 36* 35*  BILITOT 0.5 0.5 0.5    Studies:  CT Angio Chest PE W/Cm &/Or Wo Cm  Result Date: 09/21/2020 CLINICAL DATA:  Chest and abdominal pain with foul-smelling urine, initial encounter EXAM: CT ANGIOGRAPHY CHEST CT ABDOMEN AND PELVIS WITH CONTRAST TECHNIQUE: Multidetector CT imaging of the chest was performed using the standard protocol during bolus administration of intravenous contrast. Multiplanar CT image reconstructions and MIPs were obtained to evaluate the vascular anatomy. Multidetector CT imaging of the abdomen and pelvis was performed using the standard protocol during bolus administration of intravenous contrast. CONTRAST:  176mL OMNIPAQUE IOHEXOL 350 MG/ML SOLN COMPARISON:  Chest x-ray from earlier in the same day, CT from 04/25/2020 FINDINGS: CTA CHEST FINDINGS Cardiovascular: Thoracic aorta demonstrates mild atherosclerotic calcifications. No aneurysmal dilatation or dissection is noted. The heart is not significantly enlarged in size. The pulmonary artery shows a normal branching pattern bilaterally. No filling defect to suggest pulmonary embolism is seen. Coronary calcifications are noted. Mediastinum/Nodes: Thoracic inlet is within normal limits. Scattered noncalcified  hilar lymph nodes are noted bilaterally. The largest of these on the left measures approximately 12 mm in short axis. Largest of these on the right measures approximately 11 mm in short axis. A few calcified lymph nodes are noted within the right hilum likely related to prior granulomatous disease. The esophagus as visualized appears within normal limits. Lungs/Pleura: Large  right-sided pleural effusion is noted. The residual right lung is well aerated with scattered pulmonary nodules identified. The largest of these lies in the right upper lobe best seen on image number 44 of series 4 measuring approximately 7 mm. This was not well appreciated on the recent exam from February of this year. Consolidation in the right lower lobe is noted related to the underlying effusion. Multiple nodular densities are noted throughout the left lower lobe which have increased in the interval from the prior exam. The largest of these is noted on image number 68 of series 4. This nodule was not present on the prior exam. No sizable effusion on the left is noted. Scattered smaller left-sided nodules are seen. The previously seen lower lobe nodule has increased in size now measuring approximately 10 mm in dimension. Musculoskeletal: Degenerative changes of the thoracic spine are noted. No discrete lytic or sclerotic lesions are seen. There is however a soft tissue mass lesion in the right breast which was not well appreciated on the prior exam due to the imaging technique. This measures approximately 7.0 x 4.5 cm in greatest dimension. Delayed images demonstrates some peripheral enhancement particularly inferiorly with central necrosis. This is suspicious for underlying breast neoplasm until proven otherwise. No sizable associated adenopathy is noted. Review of the MIP images confirms the above findings. CT ABDOMEN and PELVIS FINDINGS Hepatobiliary: Liver is well visualized and mildly fatty infiltrated. No focal mass is seen. Changes of cholelithiasis are seen without complicating factors. Pancreas: Unremarkable. No pancreatic ductal dilatation or surrounding inflammatory changes. Spleen: Normal in size without focal abnormality. Adrenals/Urinary Tract: Adrenal glands are within normal limits. Kidneys demonstrate a normal enhancement pattern bilaterally. No renal calculi or obstructive changes are seen. The  ureters are within normal limits. The bladder is well distended. Stomach/Bowel: Colon shows no obstructive or inflammatory changes. Visualized small bowel is within normal limits. The appendix is not well appreciated. No inflammatory changes to suggest appendicitis are noted. Small bowel and stomach are unremarkable. Vascular/Lymphatic: Aortic atherosclerosis. No enlarged abdominal or pelvic lymph nodes. Reproductive: Status post hysterectomy. No adnexal masses. Other: No abdominal wall hernia or abnormality. No abdominopelvic ascites. Musculoskeletal: Compression deformities of T11 and L2 are seen. These appear chronic in nature. No lytic or sclerotic lesion is seen. Review of the MIP images confirms the above findings. IMPRESSION: CTA of the chest: No evidence of pulmonary emboli. Large right-sided pleural effusion. Multiple pulmonary nodules are noted bilaterally suspicious for metastatic disease. These have increased in both size and number when compare with the prior exam of February of 2022. Some associated hilar adenopathy is noted. Large right breast mass with peripheral enhancement and central necrosis highly suspicious for a breast neoplasm. Diagnostic mammography and tissue sampling is recommended as clinically indicated. CT of the abdomen and pelvis: Cholelithiasis without complicating factors. T11 and L2 compression deformities which appear chronic in nature. No other focal abnormality is noted. Electronically Signed   By: Inez Catalina M.D.   On: 09/21/2020 21:15   CT Abdomen Pelvis W Contrast  Result Date: 09/21/2020 CLINICAL DATA:  Chest and abdominal pain with foul-smelling urine, initial encounter EXAM: CT  ANGIOGRAPHY CHEST CT ABDOMEN AND PELVIS WITH CONTRAST TECHNIQUE: Multidetector CT imaging of the chest was performed using the standard protocol during bolus administration of intravenous contrast. Multiplanar CT image reconstructions and MIPs were obtained to evaluate the vascular anatomy.  Multidetector CT imaging of the abdomen and pelvis was performed using the standard protocol during bolus administration of intravenous contrast. CONTRAST:  153mL OMNIPAQUE IOHEXOL 350 MG/ML SOLN COMPARISON:  Chest x-ray from earlier in the same day, CT from 04/25/2020 FINDINGS: CTA CHEST FINDINGS Cardiovascular: Thoracic aorta demonstrates mild atherosclerotic calcifications. No aneurysmal dilatation or dissection is noted. The heart is not significantly enlarged in size. The pulmonary artery shows a normal branching pattern bilaterally. No filling defect to suggest pulmonary embolism is seen. Coronary calcifications are noted. Mediastinum/Nodes: Thoracic inlet is within normal limits. Scattered noncalcified hilar lymph nodes are noted bilaterally. The largest of these on the left measures approximately 12 mm in short axis. Largest of these on the right measures approximately 11 mm in short axis. A few calcified lymph nodes are noted within the right hilum likely related to prior granulomatous disease. The esophagus as visualized appears within normal limits. Lungs/Pleura: Large right-sided pleural effusion is noted. The residual right lung is well aerated with scattered pulmonary nodules identified. The largest of these lies in the right upper lobe best seen on image number 44 of series 4 measuring approximately 7 mm. This was not well appreciated on the recent exam from February of this year. Consolidation in the right lower lobe is noted related to the underlying effusion. Multiple nodular densities are noted throughout the left lower lobe which have increased in the interval from the prior exam. The largest of these is noted on image number 68 of series 4. This nodule was not present on the prior exam. No sizable effusion on the left is noted. Scattered smaller left-sided nodules are seen. The previously seen lower lobe nodule has increased in size now measuring approximately 10 mm in dimension.  Musculoskeletal: Degenerative changes of the thoracic spine are noted. No discrete lytic or sclerotic lesions are seen. There is however a soft tissue mass lesion in the right breast which was not well appreciated on the prior exam due to the imaging technique. This measures approximately 7.0 x 4.5 cm in greatest dimension. Delayed images demonstrates some peripheral enhancement particularly inferiorly with central necrosis. This is suspicious for underlying breast neoplasm until proven otherwise. No sizable associated adenopathy is noted. Review of the MIP images confirms the above findings. CT ABDOMEN and PELVIS FINDINGS Hepatobiliary: Liver is well visualized and mildly fatty infiltrated. No focal mass is seen. Changes of cholelithiasis are seen without complicating factors. Pancreas: Unremarkable. No pancreatic ductal dilatation or surrounding inflammatory changes. Spleen: Normal in size without focal abnormality. Adrenals/Urinary Tract: Adrenal glands are within normal limits. Kidneys demonstrate a normal enhancement pattern bilaterally. No renal calculi or obstructive changes are seen. The ureters are within normal limits. The bladder is well distended. Stomach/Bowel: Colon shows no obstructive or inflammatory changes. Visualized small bowel is within normal limits. The appendix is not well appreciated. No inflammatory changes to suggest appendicitis are noted. Small bowel and stomach are unremarkable. Vascular/Lymphatic: Aortic atherosclerosis. No enlarged abdominal or pelvic lymph nodes. Reproductive: Status post hysterectomy. No adnexal masses. Other: No abdominal wall hernia or abnormality. No abdominopelvic ascites. Musculoskeletal: Compression deformities of T11 and L2 are seen. These appear chronic in nature. No lytic or sclerotic lesion is seen. Review of the MIP images confirms the above findings. IMPRESSION:  CTA of the chest: No evidence of pulmonary emboli. Large right-sided pleural effusion.  Multiple pulmonary nodules are noted bilaterally suspicious for metastatic disease. These have increased in both size and number when compare with the prior exam of February of 2022. Some associated hilar adenopathy is noted. Large right breast mass with peripheral enhancement and central necrosis highly suspicious for a breast neoplasm. Diagnostic mammography and tissue sampling is recommended as clinically indicated. CT of the abdomen and pelvis: Cholelithiasis without complicating factors. T11 and L2 compression deformities which appear chronic in nature. No other focal abnormality is noted. Electronically Signed   By: Inez Catalina M.D.   On: 09/21/2020 21:15   DG Chest Portable 1 View  Result Date: 09/21/2020 CLINICAL DATA:  Short of breath, tachycardia EXAM: PORTABLE CHEST 1 VIEW COMPARISON:  04/25/2020 FINDINGS: Left lung grossly clear. Asymmetric opacity in the right thorax likely due to moderate layering effusion. Airspace disease at the right base. Stable cardiomediastinal silhouette. IMPRESSION: Asymmetric opacity in the right thorax suspected to be secondary to at least moderate layering pleural effusion. Airspace disease at right base may reflect atelectasis or pneumonia Electronically Signed   By: Donavan Foil M.D.   On: 09/21/2020 17:30

## 2020-09-24 ENCOUNTER — Inpatient Hospital Stay (HOSPITAL_COMMUNITY): Payer: Medicare Other

## 2020-09-24 DIAGNOSIS — J90 Pleural effusion, not elsewhere classified: Secondary | ICD-10-CM | POA: Diagnosis not present

## 2020-09-24 LAB — URINE CULTURE: Culture: 10000 — AB

## 2020-09-24 MED ORDER — SODIUM CHLORIDE 0.9 % IV SOLN
INTRAVENOUS | Status: AC
  Filled 2020-09-24: qty 250

## 2020-09-24 MED ORDER — FENTANYL CITRATE (PF) 100 MCG/2ML IJ SOLN
INTRAMUSCULAR | Status: AC
  Filled 2020-09-24: qty 2

## 2020-09-24 MED ORDER — FLUMAZENIL 0.5 MG/5ML IV SOLN
INTRAVENOUS | Status: AC
  Filled 2020-09-24: qty 5

## 2020-09-24 MED ORDER — FENTANYL CITRATE (PF) 100 MCG/2ML IJ SOLN
INTRAMUSCULAR | Status: AC | PRN
  Administered 2020-09-24 (×2): 25 ug via INTRAVENOUS

## 2020-09-24 MED ORDER — MIDAZOLAM HCL 2 MG/2ML IJ SOLN
INTRAMUSCULAR | Status: AC | PRN
  Administered 2020-09-24 (×3): 0.5 mg via INTRAVENOUS

## 2020-09-24 MED ORDER — LIDOCAINE HCL (PF) 1 % IJ SOLN
INTRAMUSCULAR | Status: AC | PRN
  Administered 2020-09-24: 10 mL via INTRADERMAL

## 2020-09-24 MED ORDER — SODIUM CHLORIDE 0.9 % IV SOLN
1.0000 g | Freq: Once | INTRAVENOUS | Status: AC
  Administered 2020-09-24: 1 g via INTRAVENOUS
  Filled 2020-09-24: qty 1

## 2020-09-24 MED ORDER — IOHEXOL 300 MG/ML  SOLN
75.0000 mL | Freq: Once | INTRAMUSCULAR | Status: AC | PRN
  Administered 2020-09-24: 75 mL via INTRAVENOUS

## 2020-09-24 MED ORDER — MIDAZOLAM HCL 2 MG/2ML IJ SOLN
INTRAMUSCULAR | Status: AC
  Filled 2020-09-24: qty 4

## 2020-09-24 MED ORDER — NALOXONE HCL 0.4 MG/ML IJ SOLN
INTRAMUSCULAR | Status: AC
  Filled 2020-09-24: qty 1

## 2020-09-24 NOTE — Progress Notes (Signed)
Pt transported to CT.  Paula Cantu

## 2020-09-24 NOTE — Procedures (Signed)
Pre procedural Dx: Concern for malignant right sided pleural effusion.  Post procedural Dx: Same  Technically successful CT guided thoracentesis yielding 900 cc of blood tinged fluid. A representative aspirated sample was capped and sent to the laboratory for cytologic analysis.    Note, the decision was made NOT to proceed with CT guided chest tube placement as the pt was VERY combative on the CT gantry (even despite sedation medications) and unable to follow commands and therefore is at VERY high risk for pulling out a chest tube.  Given above, pt is also NOT a candidate for image guided pleur-X catheter placement for the same reasoning.   EBL: Trace Complications: None immediate  Ronny Bacon, MD Pager #: [phone]

## 2020-09-24 NOTE — Care Management Important Message (Signed)
Important Message  Patient Details IM Letter given to the Patient. Name: Paula Cantu MRN: [id number] Date of Birth: 1946/02/01   Medicare Important Message Given:  Yes     Kerin Salen 09/24/2020, 10:03 AM

## 2020-09-24 NOTE — Progress Notes (Signed)
PROGRESS NOTE    Paula Cantu  MOQ:[id number] DOB: Apr 11, 1945 DOA: 09/21/2020 PCP: Trinidad Curet   Brief Narrative:  This 75 years old female with PMH significant for metastatic breast cancer, cognitive impairment, type 2 diabetes presented in the ED with increased urinary frequency.  Patient is nonverbal at baseline.She had several days of malodorous dark frequent urine..  She had required more assistance at her group home.  She is also found to have difficulty breathing and dry cough.  Patient was brought in the ED.  UA was negative,  Patient was found to be intermittently hypoxic.CTA chest ruled out pulmonary embolism but found to have pulmonary nodules,  pleural effusion and right breast mass.  Assessment & Plan:   Active Problems:   Pleural effusion  Acute hypoxic respiratory failure secondary to pulmonary effusion. Patient was found to have intermittent hypoxia.  She was 87% on room air. O2 saturation improved to 96% on 2 L supplemental oxygen. Continue supplemental oxygen to keep saturation above 94%. IR consulted for thoracocentesis. Since this is presumed malignant effusion and would likely reaccumulate.  IR suggested for pigtail chest tube on 09/24/20 Depend upon the cytology and how much /faster fluid reaccumulate she may need to be transition to tunneled pleural catheter( Pleurx) and transition to hospice or palliative care.  Sinus tachycardia: Continue metoprolol 5 mg IV push every 6 hours as needed.  Severe intellectual disability.: Patient is nonverbal at baseline. She does have some intermittent agitation. Continue Ativan as needed.  Resume Home medications.  Type 2 diabetes: Continue sliding scale.  History of breast cancer: Oncology consulted.  Patient has a stage IV breast cancer which is noncurable. Palliative care consulted to discuss goals of care.  DVT prophylaxis: SCDs Code Status: Full code Family Communication: No family at bed side. Disposition  Plan:   Status is: Inpatient  Remains inpatient appropriate because:Inpatient level of care appropriate due to severity of illness  Dispo: The patient is from:  [place]              Anticipated d/c is to: Group Home              Patient currently is not medically stable to d/c.   Difficult to place patient No  Consultants:  Oncology Palliative care.  Procedures:  Antimicrobials:   Anti-infectives (From admission, onward)    None       Subjective: Patient was seen and examined at bedside.  Overnight events noted.  Patient is nonverbal but lies comfortably. She does not seem to to have distress.  Patient is lying flat on the bed.  RN reports no concerns.  Objective: Vitals:   09/23/20 0456 09/23/20 1252 09/23/20 2133 09/24/20 0554  BP: (!) 155/87 (!) 126/109  (!) 146/82  Pulse: (!) 120 (!) 103 (!) 105 (!) 128  Resp: 19 20    Temp: 97.9 F (36.6 C) 97.8 F (36.6 C)  98.5 F (36.9 C)  TempSrc: Axillary   Axillary  SpO2: 92% 96% 91% 90%  Weight:      Height:        Intake/Output Summary (Last 24 hours) at 09/24/2020 1404 Last data filed at 09/24/2020 1300 Gross per 24 hour  Intake 240 ml  Output 600 ml  Net -360 ml   Filed Weights   09/21/20 1608 09/22/20 1635  Weight: 47.2 kg 57.7 kg    Examination:  General exam: Appears calm and comfortable , not in any acute distress. Deconditioned. Respiratory system:  Decreased breath sounds,  good air entry. Respiratory effort normal. Cardiovascular system: S1 & S2 heard, RRR. No JVD, murmurs, rubs, gallops or clicks. No pedal edema. Gastrointestinal system: Abdomen is nondistended, soft and nontender. No organomegaly or masses felt. Normal bowel sounds heard. Central nervous system: Alert and oriented. No focal neurological deficits. Extremities: Symmetric 5 x 5 power. No edema, no cyanosis, no clubbing. Skin: No rashes, lesions or ulcers Psychiatry: Judgement and insight appear normal. Mood & affect appropriate.      Data Reviewed: I have personally reviewed following labs and imaging studies  CBC: Recent Labs  Lab 09/21/20 1622 09/22/20 1008 09/23/20 0317  WBC 10.4 9.4 8.1  NEUTROABS 8.1* 5.8  --   HGB 11.8* 12.5 12.1  HCT 36.7 39.7 37.8  MCV 94.8 95.9 94.5  PLT 217 216 154   Basic Metabolic Panel: Recent Labs  Lab 09/21/20 1622 09/22/20 1008 09/23/20 0317  NA 139 142 143  K 3.8 3.9 3.9  CL 110 110 109  CO2 23 25 29   GLUCOSE 112* 100* 100*  BUN 11 8 7*  CREATININE 0.59 0.53 0.58  CALCIUM 8.1* 8.1* 8.1*  MG  --  2.0  --    GFR: Estimated Creatinine Clearance: 32.9 mL/min (by C-G formula based on SCr of 0.58 mg/dL). Liver Function Tests: Recent Labs  Lab 09/21/20 1622 09/22/20 1008 09/23/20 0317  AST 17 21 25   ALT 12 16 20   ALKPHOS 33* 36* 35*  BILITOT 0.5 0.5 0.5  PROT 5.7* 5.8* 5.4*  ALBUMIN 3.0* 3.0* 2.8*   Recent Labs  Lab 09/21/20 1622  LIPASE 32   No results for input(s): AMMONIA in the last 168 hours. Coagulation Profile: Recent Labs  Lab 09/22/20 1008  INR 1.0   Cardiac Enzymes: No results for input(s): CKTOTAL, CKMB, CKMBINDEX, TROPONINI in the last 168 hours. BNP (last 3 results) No results for input(s): PROBNP in the last 8760 hours. HbA1C: No results for input(s): HGBA1C in the last 72 hours. CBG: Recent Labs  Lab 09/21/20 1634  GLUCAP 114*   Lipid Profile: No results for input(s): CHOL, HDL, LDLCALC, TRIG, CHOLHDL, LDLDIRECT in the last 72 hours. Thyroid Function Tests: No results for input(s): TSH, T4TOTAL, FREET4, T3FREE, THYROIDAB in the last 72 hours. Anemia Panel: No results for input(s): VITAMINB12, FOLATE, FERRITIN, TIBC, IRON, RETICCTPCT in the last 72 hours. Sepsis Labs: Recent Labs  Lab 09/21/20 1622  LATICACIDVEN 1.4    Recent Results (from the past 240 hour(s))  Blood culture (routine x 2)     Status: None (Preliminary result)   Collection Time: 09/21/20  4:22 PM   Specimen: BLOOD  Result Value Ref Range Status    Specimen Description   Final    BLOOD RIGHT ANTECUBITAL Performed at [facility] [address] [place], [place] [address]    Special Requests   Final    BOTTLES DRAWN AEROBIC AND ANAEROBIC Blood Culture adequate volume Performed at [facility] [address], [place], [place] [address]    Culture   Final    NO GROWTH 3 DAYS Performed at [facility], [place] [address] [place],  [address]    Report Status PENDING  Incomplete  Blood culture (routine x 2)     Status: None (Preliminary result)   Collection Time: 09/21/20  4:27 PM   Specimen: BLOOD  Result Value Ref Range Status   Specimen Description   Final    BLOOD LEFT ANTECUBITAL Performed at  [facility], [place] [address] [place], [place] [address]    Special Requests   Final    BOTTLES DRAWN AEROBIC AND ANAEROBIC Blood Culture adequate volume Performed at [facility] [address] [place], [place] [address]    Culture   Final    NO GROWTH 3 DAYS Performed at [facility], [place] [address], [place], [place] [address]    Report Status PENDING  Incomplete  Resp Panel by RT-PCR (Flu A&B, Covid) Nasopharyngeal Swab     Status: None   Collection Time: 09/21/20  7:04 PM   Specimen: Nasopharyngeal Swab; Nasopharyngeal(NP) swabs in vial transport medium  Result Value Ref Range Status   SARS Coronavirus 2 by RT PCR NEGATIVE NEGATIVE Final    Comment: (NOTE) SARS-CoV-2 target nucleic acids are NOT DETECTED.  The SARS-CoV-2 RNA is generally detectable in upper respiratory specimens during the acute phase of infection. The lowest concentration of SARS-CoV-2 viral copies this assay can detect is 138 copies/mL. A negative result does not preclude SARS-Cov-2 infection and should not be used as the sole basis for treatment or other patient management decisions. A negative result may occur with  improper specimen collection/handling,  submission of specimen other than nasopharyngeal swab, presence of viral mutation(s) within the areas targeted by this assay, and inadequate number of viral copies(<138 copies/mL). A negative result must be combined with clinical observations, patient history, and epidemiological information. The expected result is Negative.  Fact Sheet for Patients:  EntrepreneurPulse.com.au  Fact Sheet for Healthcare Providers:  IncredibleEmployment.be  This test is no t yet approved or cleared by the [place] FDA and  has been authorized for detection and/or diagnosis of SARS-CoV-2 by FDA under an Emergency Use Authorization (EUA). This EUA will remain  in effect (meaning this test can be used) for the duration of the COVID-19 declaration under Section 564(b)(1) of the Act, 21 U.S.C.section 360bbb-3(b)(1), unless the authorization is terminated  or revoked sooner.       Influenza A by PCR NEGATIVE NEGATIVE Final   Influenza B by PCR NEGATIVE NEGATIVE Final    Comment: (NOTE) The Xpert Xpress SARS-CoV-2/FLU/RSV plus assay is intended as an aid in the diagnosis of influenza from Nasopharyngeal swab specimens and should not be used as a sole basis for treatment. Nasal washings and aspirates are unacceptable for Xpert Xpress SARS-CoV-2/FLU/RSV testing.  Fact Sheet for Patients: EntrepreneurPulse.com.au  Fact Sheet for Healthcare Providers: IncredibleEmployment.be  This test is not yet approved or cleared by the [place] FDA and has been authorized for detection and/or diagnosis of SARS-CoV-2 by FDA under an Emergency Use Authorization (EUA). This EUA will remain in effect (meaning this test can be used) for the duration of the COVID-19 declaration under Section 564(b)(1) of the Act, 21 U.S.C. section 360bbb-3(b)(1), unless the authorization is terminated or revoked.  Performed at [facility], [place] [address] [place], [place] [address]   Urine culture     Status: Abnormal   Collection Time: 09/21/20  7:37 PM   Specimen: Urine, Random  Result Value Ref Range Status   Specimen Description   Final    URINE, RANDOM Performed at [facility] [address] [place], [place] [address]    Special Requests   Final    NONE Performed at [facility], [place] [address] [place], [place] [address]    Culture 10,000 COLONIES/mL ESCHERICHIA COLI (A)  Final   Report Status 09/24/2020  FINAL  Final   Organism ID, Bacteria ESCHERICHIA COLI (A)  Final      Susceptibility   Escherichia coli - MIC*    AMPICILLIN 4 SENSITIVE Sensitive     CEFAZOLIN <=4 SENSITIVE Sensitive     CEFEPIME <=0.12 SENSITIVE Sensitive     CEFTRIAXONE <=0.25 SENSITIVE Sensitive     CIPROFLOXACIN >=4 RESISTANT Resistant     GENTAMICIN <=1 SENSITIVE Sensitive     IMIPENEM <=0.25 SENSITIVE Sensitive     NITROFURANTOIN <=16 SENSITIVE Sensitive     TRIMETH/SULFA <=20 SENSITIVE Sensitive     AMPICILLIN/SULBACTAM <=2 SENSITIVE Sensitive     PIP/TAZO <=4 SENSITIVE Sensitive     * 10,000 COLONIES/mL ESCHERICHIA COLI     Radiology Studies: No results found.   Scheduled Meds:  vitamin C  500 mg Oral BID   cholecalciferol  4,000 Units Oral Daily   divalproex  250 mg Oral BID   omega-3 acid ethyl esters  1 g Oral Daily   senna-docusate  2 tablet Oral QHS   tamoxifen  20 mg Oral Daily   Continuous Infusions:   LOS: 3 days    Time spent: 25 mins    Shawna Clamp, MD [facility]   If 7PM-7AM, please contact night-coverage

## 2020-09-25 ENCOUNTER — Other Ambulatory Visit: Payer: Self-pay | Admitting: Oncology

## 2020-09-25 DIAGNOSIS — J9601 Acute respiratory failure with hypoxia: Secondary | ICD-10-CM | POA: Diagnosis not present

## 2020-09-25 DIAGNOSIS — Z79811 Long term (current) use of aromatase inhibitors: Secondary | ICD-10-CM

## 2020-09-25 DIAGNOSIS — J90 Pleural effusion, not elsewhere classified: Secondary | ICD-10-CM | POA: Diagnosis not present

## 2020-09-25 DIAGNOSIS — Z515 Encounter for palliative care: Secondary | ICD-10-CM

## 2020-09-25 DIAGNOSIS — C50812 Malignant neoplasm of overlapping sites of left female breast: Secondary | ICD-10-CM | POA: Diagnosis not present

## 2020-09-25 DIAGNOSIS — C7800 Secondary malignant neoplasm of unspecified lung: Secondary | ICD-10-CM | POA: Diagnosis not present

## 2020-09-25 DIAGNOSIS — J9690 Respiratory failure, unspecified, unspecified whether with hypoxia or hypercapnia: Secondary | ICD-10-CM | POA: Diagnosis not present

## 2020-09-25 DIAGNOSIS — J910 Malignant pleural effusion: Secondary | ICD-10-CM | POA: Diagnosis not present

## 2020-09-25 DIAGNOSIS — Z7189 Other specified counseling: Secondary | ICD-10-CM | POA: Diagnosis not present

## 2020-09-25 DIAGNOSIS — C50912 Malignant neoplasm of unspecified site of left female breast: Secondary | ICD-10-CM | POA: Diagnosis not present

## 2020-09-25 DIAGNOSIS — Z170 Estrogen receptor positive status [ER+]: Secondary | ICD-10-CM

## 2020-09-25 LAB — CBC
HCT: 40.2 % (ref 36.0–46.0)
Hemoglobin: 12.9 g/dL (ref 12.0–15.0)
MCH: 30.1 pg (ref 26.0–34.0)
MCHC: 32.1 g/dL (ref 30.0–36.0)
MCV: 93.9 fL (ref 80.0–100.0)
Platelets: 238 10*3/uL (ref 150–400)
RBC: 4.28 MIL/uL (ref 3.87–5.11)
RDW: 13.9 % (ref 11.5–15.5)
WBC: 12 10*3/uL — ABNORMAL HIGH (ref 4.0–10.5)
nRBC: 0 % (ref 0.0–0.2)

## 2020-09-25 LAB — BASIC METABOLIC PANEL
Anion gap: 8 (ref 5–15)
BUN: 12 mg/dL (ref 8–23)
CO2: 23 mmol/L (ref 22–32)
Calcium: 8.2 mg/dL — ABNORMAL LOW (ref 8.9–10.3)
Chloride: 111 mmol/L (ref 98–111)
Creatinine, Ser: 0.5 mg/dL (ref 0.44–1.00)
GFR, Estimated: >60 mL/min (ref >60)
Glucose, Bld: 114 mg/dL — ABNORMAL HIGH (ref 70–99)
Potassium: 4 mmol/L (ref 3.5–5.1)
Sodium: 142 mmol/L (ref 135–145)

## 2020-09-25 LAB — PHOSPHORUS: Phosphorus: 4.4 mg/dL (ref 2.5–4.6)

## 2020-09-25 LAB — MAGNESIUM: Magnesium: 2.2 mg/dL (ref 1.7–2.4)

## 2020-09-25 MED ORDER — ANASTROZOLE 1 MG PO TABS
1.0000 mg | ORAL_TABLET | Freq: Every day | ORAL | Status: DC
Start: 2020-09-25 — End: 2020-09-27
  Administered 2020-09-25 – 2020-09-27 (×3): 1 mg via ORAL
  Filled 2020-09-25 (×3): qty 1

## 2020-09-25 NOTE — Progress Notes (Addendum)
PROGRESS NOTE    Paula Cantu  FYB:[id number] DOB: 07-11-1945 DOA: 09/21/2020 PCP: Trinidad Curet   Brief Narrative:  This 75 years old female with PMH significant for metastatic breast cancer, cognitive impairment, type 2 diabetes presented in the ED with increased urinary frequency.  Patient is nonverbal at baseline.She had several days of malodorous dark frequent urine.  She had required more assistance at her group home.  She is also found to have difficulty breathing and dry cough.  Patient was brought in the ED.  UA was negative,  Patient was found to be intermittently hypoxic. CTA chest ruled out pulmonary embolism but found to have pulmonary nodules,  pleural effusion and right breast mass.  She underwent thoracocentesis.  Palliative care consulted to discuss goals of care.  Assessment & Plan:   Active Problems:   Pleural effusion  Acute hypoxic respiratory failure secondary to Pleueral effusion. Patient was found to have intermittent hypoxia.  She was 87% on room air. O2 saturation improved to 96% on 2 L supplemental oxygen. Continue supplemental oxygen to keep saturation above 94%. IR consulted for thoracocentesis. Since this is presumed malignant effusion and would likely reaccumulate.  IR suggested for pigtail chest tube on 09/24/20 Depend upon the cytology and how much /faster fluid reaccumulate she may need to be transition to tunneled pleural catheter( Pleurx) and transition to hospice or palliative care. Patient underwent successful thoracocentesis 900 mL of blood tinged fluid drained,  awaiting cytology Patient was very combative, decision was made not to proceed with chest tube placement.  High risk she may pullout  chest tube. She is weaned down to room air.  Sinus tachycardia: Continue metoprolol 5 mg IV push every 6 hours as needed.  Severe intellectual disability.: Patient is nonverbal at baseline. She does have some intermittent agitation. Continue Ativan as  needed.  Resume Home medications.  Type 2 diabetes: Continue sliding scale.  History of breast cancer: Oncology consulted.  Patient has a stage IV breast cancer which is noncurable. Patient is not a candidate for systemic chemotherapy. Palliative care consulted to discuss goals of care.  UTI: Continue ceftriaxone for 3 days. Urine culture grew 10 K of E. coli  DVT prophylaxis: SCDs Code Status: Full code Family Communication: No family at bed side. Disposition Plan:   Status is: Inpatient  Remains inpatient appropriate because:Inpatient level of care appropriate due to severity of illness  Dispo: The patient is from:  [place]              Anticipated d/c is to: Group Home              Patient currently is not medically stable to d/c.   Difficult to place patient No  Consultants:  Oncology Palliative care.  Procedures:  Antimicrobials:   Anti-infectives (From admission, onward)    Start     Dose/Rate Route Frequency Ordered Stop   09/24/20 1600  cefTRIAXone (ROCEPHIN) 1 g in sodium chloride 0.9 % 100 mL IVPB        1 g 200 mL/hr over 30 Minutes Intravenous  Once 09/24/20 1407 09/25/20 0759       Subjective: Patient was seen and examined at bedside.  Overnight events noted.  Patient is nonverbal but lies comfortably. She does not seem to have any distress.  Patient is lying flat on the bed.  RN reports no concerns.  Objective: Vitals:   09/24/20 1752 09/24/20 2037 09/25/20 0457 09/25/20 1242  BP: (!) 157/82 119/86 Marland Kitchen)  139/96 (!) 145/91  Pulse: (!) 130 (!) 130 96 (!) 126  Resp: 20  14 20   Temp: 98.5 F (36.9 C) 98 F (36.7 C) 99.8 F (37.7 C) (!) 97.1 F (36.2 C)  TempSrc: Axillary Oral Oral Axillary  SpO2: 94% 90%  90%  Weight:      Height:        Intake/Output Summary (Last 24 hours) at 09/25/2020 1452 Last data filed at 09/25/2020 1233 Gross per 24 hour  Intake 120 ml  Output 750 ml  Net -630 ml   Filed Weights   09/21/20 1608 09/22/20 1635   Weight: 47.2 kg 57.7 kg    Examination:  General exam: Appears calm and comfortable , not in any acute distress. Deconditioned. Respiratory system: Decreased breath sounds,  good air entry. Respiratory effort normal. Cardiovascular system: S1 & S2 heard, RRR. No JVD, murmurs, rubs, gallops or clicks. No pedal edema. Gastrointestinal system: Abdomen is nondistended, soft and nontender. No organomegaly or masses felt. Normal bowel sounds heard. Central nervous system: Alert and oriented. No focal neurological deficits. Extremities: Symmetric 5 x 5 power. No edema, no cyanosis, no clubbing. Skin: No rashes, lesions or ulcers Psychiatry: Judgement and insight appear normal. Mood & affect appropriate.     Data Reviewed: I have personally reviewed following labs and imaging studies  CBC: Recent Labs  Lab 09/21/20 1622 09/22/20 1008 09/23/20 0317 09/25/20 0334  WBC 10.4 9.4 8.1 12.0*  NEUTROABS 8.1* 5.8  --   --   HGB 11.8* 12.5 12.1 12.9  HCT 36.7 39.7 37.8 40.2  MCV 94.8 95.9 94.5 93.9  PLT 217 216 201 161   Basic Metabolic Panel: Recent Labs  Lab 09/21/20 1622 09/22/20 1008 09/23/20 0317 09/25/20 0334  NA 139 142 143 142  K 3.8 3.9 3.9 4.0  CL 110 110 109 111  CO2 23 25 29 23   GLUCOSE 112* 100* 100* 114*  BUN 11 8 7* 12  CREATININE 0.59 0.53 0.58 0.50  CALCIUM 8.1* 8.1* 8.1* 8.2*  MG  --  2.0  --  2.2  PHOS  --   --   --  4.4   GFR: Estimated Creatinine Clearance: 32.9 mL/min (by C-G formula based on SCr of 0.5 mg/dL). Liver Function Tests: Recent Labs  Lab 09/21/20 1622 09/22/20 1008 09/23/20 0317  AST 17 21 25   ALT 12 16 20   ALKPHOS 33* 36* 35*  BILITOT 0.5 0.5 0.5  PROT 5.7* 5.8* 5.4*  ALBUMIN 3.0* 3.0* 2.8*   Recent Labs  Lab 09/21/20 1622  LIPASE 32   No results for input(s): AMMONIA in the last 168 hours. Coagulation Profile: Recent Labs  Lab 09/22/20 1008  INR 1.0   Cardiac Enzymes: No results for input(s): CKTOTAL, CKMB, CKMBINDEX,  TROPONINI in the last 168 hours. BNP (last 3 results) No results for input(s): PROBNP in the last 8760 hours. HbA1C: No results for input(s): HGBA1C in the last 72 hours. CBG: Recent Labs  Lab 09/21/20 1634  GLUCAP 114*   Lipid Profile: No results for input(s): CHOL, HDL, LDLCALC, TRIG, CHOLHDL, LDLDIRECT in the last 72 hours. Thyroid Function Tests: No results for input(s): TSH, T4TOTAL, FREET4, T3FREE, THYROIDAB in the last 72 hours. Anemia Panel: No results for input(s): VITAMINB12, FOLATE, FERRITIN, TIBC, IRON, RETICCTPCT in the last 72 hours. Sepsis Labs: Recent Labs  Lab 09/21/20 1622  LATICACIDVEN 1.4    Recent Results (from the past 240 hour(s))  Blood culture (routine x 2)  Status: None (Preliminary result)   Collection Time: 09/21/20  4:22 PM   Specimen: BLOOD  Result Value Ref Range Status   Specimen Description   Final    BLOOD RIGHT ANTECUBITAL Performed at [place] [address] [place], [place] [address]    Special Requests   Final    BOTTLES DRAWN AEROBIC AND ANAEROBIC Blood Culture adequate volume Performed at [facility] [address] [place], [place] [address]    Culture   Final    NO GROWTH 4 DAYS Performed at [facility], [place] [address] [place], [place] [address]    Report Status PENDING  Incomplete  Blood culture (routine x 2)     Status: None (Preliminary result)   Collection Time: 09/21/20  4:27 PM   Specimen: BLOOD  Result Value Ref Range Status   Specimen Description   Final    BLOOD LEFT ANTECUBITAL Performed at [facility] [address], [place], [place] [address]    Special Requests   Final    BOTTLES DRAWN AEROBIC AND ANAEROBIC Blood Culture adequate volume Performed at [facility] [address] [place], [place] [address]    Culture   Final    NO GROWTH 4 DAYS Performed at [facility], [place] [address], [place], [place]  [address]    Report Status PENDING  Incomplete  Resp Panel by RT-PCR (Flu A&B, Covid) Nasopharyngeal Swab     Status: None   Collection Time: 09/21/20  7:04 PM   Specimen: Nasopharyngeal Swab; Nasopharyngeal(NP) swabs in vial transport medium  Result Value Ref Range Status   SARS Coronavirus 2 by RT PCR NEGATIVE NEGATIVE Final    Comment: (NOTE) SARS-CoV-2 target nucleic acids are NOT DETECTED.  The SARS-CoV-2 RNA is generally detectable in upper respiratory specimens during the acute phase of infection. The lowest concentration of SARS-CoV-2 viral copies this assay can detect is 138 copies/mL. A negative result does not preclude SARS-Cov-2 infection and should not be used as the sole basis for treatment or other patient management decisions. A negative result may occur with  improper specimen collection/handling, submission of specimen other than nasopharyngeal swab, presence of viral mutation(s) within the areas targeted by this assay, and inadequate number of viral copies(<138 copies/mL). A negative result must be combined with clinical observations, patient history, and epidemiological information. The expected result is Negative.  Fact Sheet for Patients:  EntrepreneurPulse.com.au  Fact Sheet for Healthcare Providers:  IncredibleEmployment.be  This test is no t yet approved or cleared by the [place] FDA and  has been authorized for detection and/or diagnosis of SARS-CoV-2 by FDA under an Emergency Use Authorization (EUA). This EUA will remain  in effect (meaning this test can be used) for the duration of the COVID-19 declaration under Section 564(b)(1) of the Act, 21 U.S.C.section 360bbb-3(b)(1), unless the authorization is terminated  or revoked sooner.       Influenza A by PCR NEGATIVE NEGATIVE Final   Influenza B by PCR NEGATIVE NEGATIVE Final    Comment: (NOTE) The Xpert Xpress SARS-CoV-2/FLU/RSV plus assay is intended as an  aid in the diagnosis of influenza from Nasopharyngeal swab specimens and should not be used as a sole basis for treatment. Nasal washings and aspirates are unacceptable for Xpert Xpress SARS-CoV-2/FLU/RSV testing.  Fact Sheet for Patients: EntrepreneurPulse.com.au  Fact Sheet for Healthcare Providers: IncredibleEmployment.be  This test is not yet approved or cleared by the [place] FDA  and has been authorized for detection and/or diagnosis of SARS-CoV-2 by FDA under an Emergency Use Authorization (EUA). This EUA will remain in effect (meaning this test can be used) for the duration of the COVID-19 declaration under Section 564(b)(1) of the Act, 21 U.S.C. section 360bbb-3(b)(1), unless the authorization is terminated or revoked.  Performed at [facility], [place] [address] [place], [place] [address]   Urine culture     Status: Abnormal   Collection Time: 09/21/20  7:37 PM   Specimen: Urine, Random  Result Value Ref Range Status   Specimen Description   Final    URINE, RANDOM Performed at [facility] [address], [place], [place] [address]    Special Requests   Final    NONE Performed at [facility], [place] [address] [place], [place] [address]    Culture 10,000 COLONIES/mL ESCHERICHIA COLI (A)  Final   Report Status 09/24/2020 FINAL  Final   Organism ID, Bacteria ESCHERICHIA COLI (A)  Final      Susceptibility   Escherichia coli - MIC*    AMPICILLIN 4 SENSITIVE Sensitive     CEFAZOLIN <=4 SENSITIVE Sensitive     CEFEPIME <=0.12 SENSITIVE Sensitive     CEFTRIAXONE <=0.25 SENSITIVE Sensitive     CIPROFLOXACIN >=4 RESISTANT Resistant     GENTAMICIN <=1 SENSITIVE Sensitive     IMIPENEM <=0.25 SENSITIVE Sensitive     NITROFURANTOIN <=16 SENSITIVE Sensitive     TRIMETH/SULFA <=20 SENSITIVE Sensitive     AMPICILLIN/SULBACTAM <=2 SENSITIVE Sensitive     PIP/TAZO <=4 SENSITIVE  Sensitive     * 10,000 COLONIES/mL ESCHERICHIA COLI     Radiology Studies: CT HEAD W & WO CONTRAST  Result Date: 09/24/2020 CLINICAL DATA:  Cancer of unknown primary, staging. EXAM: CT HEAD WITHOUT AND WITH CONTRAST TECHNIQUE: Contiguous axial images were obtained from the base of the skull through the vertex without and with intravenous contrast CONTRAST:  32mL OMNIPAQUE IOHEXOL 300 MG/ML  SOLN COMPARISON:  Head CT March 14, 2020. FINDINGS: Brain: At least 4 enhancing lesions are seen in the right cerebellar hemisphere, the largest measuring approximately 1.2 cm. No significant edema or mass effect. No evidence of acute infarction, hemorrhage, hydrocephalus, or extra-axial collection. Mild hypodensity of the periventricular white matter, nonspecific, most likely related to chronic small vessel ischemia. Vascular: No hyperdense vessel or unexpected calcification. Visible vessels are patent. Skull: Normal. Negative for fracture or focal lesion. Sinuses/Orbits: No acute finding. Other: Left mastoid and middle ear opacification. Soft tissue density within the bilateral external auditory canals. IMPRESSION: At least 4 enhancing lesions are seen in the right cerebellar hemisphere, the largest measuring approximately 1.2 cm. Given provided history, this is concerning for metastatic disease. Correlation with MRI of the brain without and with contrast is recommended. Electronically Signed   By: Pedro Earls M.D.   On: 09/24/2020 16:23   CT IMAGE GUIDED DRAINAGE BY PERCUTANEOUS CATHETER  Result Date: 09/25/2020 INDICATION: Concern for malignant right-sided pleural effusion. Please perform CT-guided thoracentesis and or chest tube placement for diagnostic and therapeutic purposes. EXAM: ULTRASOUND AND CT GUIDED RIGHT-SIDED THORACENTESIS. COMPARISON:  Chest CT-09/21/2020 MEDICATIONS: The patient is currently admitted to the hospital and receiving intravenous antibiotics. The antibiotics were  administered within an appropriate time frame prior to the initiation of the procedure. ANESTHESIA/SEDATION: Moderate (conscious) sedation was employed during this procedure. A total of Versed 2 mg and Fentanyl 50 mcg was administered intravenously. Moderate Sedation Time: 25  minutes. The patient's level of consciousness and vital signs were monitored continuously by radiology nursing throughout the procedure under my direct supervision. CONTRAST:  None COMPLICATIONS: None immediate. PROCEDURE: Informed written consent was obtained from the patient's family after a discussion of the risks, benefits and alternatives to treatment. The patient was placed supine slightly LPO on the CT gantry and a pre procedural CT was performed re-demonstrating the known large right-sided pleural effusion. Note, patient had to be secured in place with mechanical and pharmacological restraints secondary to compatible behavior. Sonographic evaluation was performed of the inferolateral aspect the right hemithorax demonstrating a large anechoic right-sided pleural effusion. The procedure was planned. A timeout was performed prior to the initiation of the procedure. Given patient's combative behavior was decided the patient would likely not tolerate a right-sided chest tube in the decision was made to solely proceed with image guided thoracentesis for diagnostic and therapeutic purposes. The inferior aspect of the right lateral chest was prepped and draped in the usual sterile fashion. The overlying soft tissues were anesthetized with 1% lidocaine with epinephrine. Under direct ultrasound guidance, the inferolateral aspect of the right pleural space was accessed with an 18 gauge trocar needle and a short Amplatz wire was coiled directed towards the right lung apex. Appropriate position was confirmed with CT imaging (series 3). Next, the trocar needle was exchanged for a Yueh sheath catheter which was utilized to aspirate approximately 900  cc of blood tinged pleural fluid from the right pleural space. Aspirated fluid was sent to the laboratory for analysis. The Yueh sheath catheter was removed and postprocedural imaging was obtained (series 4). A dressing was applied. The patient tolerated the fairly well following mechanical and pharmacological restraints and was without immediate post procedural complication. IMPRESSION: Successful ultrasound and CT guided aspiration of approximately 900 cc of blood tinged pleural fluid from the right pleural space. Samples were sent to the laboratory as requested by the ordering clinical team. Given patient's altered mental status and combine of behavior, the patient is NOT a good candidate for either image guided chest tube placement or tunneled PleurX catheter placement as there is high likelihood the patient will require constant restraints to avoid her removing these devices. Electronically Signed   By: Sandi Mariscal M.D.   On: 09/25/2020 13:09     Scheduled Meds:  anastrozole  1 mg Oral Daily   vitamin C  500 mg Oral BID   cholecalciferol  4,000 Units Oral Daily   divalproex  250 mg Oral BID   omega-3 acid ethyl esters  1 g Oral Daily   senna-docusate  2 tablet Oral QHS   Continuous Infusions:   LOS: 4 days    Time spent: 25 mins    Shawna Clamp, MD [facility]   If 7PM-7AM, please contact night-coverage

## 2020-09-25 NOTE — Progress Notes (Addendum)
HEMATOLOGY-ONCOLOGY PROGRESS NOTE  SUBJECTIVE: Paula Cantu was last seen in our office on 07/17/2020.  At that visit, she was noted to have a palpable mass in her right breast and a mammogram was ordered.  I do not see that the mammogram was ever performed.  She underwent thoracentesis on 09/24/2020 with 900 cc of fluid removed.  The patient was combative and a chest tube was not placed due to concern for pulling out chest tube.  When seen this morning, the patient is lying in bed.  She does not answer any questions.  No family at the bedside.  Oncology History  Breast cancer, left breast ([place]) (Resolved)  03/04/2013 Surgery   (L) lumpectomy ([place]): IDC, grade 3, spanning 4 cm, LVI (+), margins negative. No lymph node sampling.   ER+ (91%), PR+ (12%), Ki67  19%. HER2 neg (ratio 1.43).          pT2, pNX    03/24/2013 Initial Diagnosis   Breast cancer, left breast ([place])    04/10/2013 -  Anti-estrogen oral therapy   Tamoxifen 20 mg daily.        REVIEW OF SYSTEMS:   Unable to obtain  Past Medical History:  Diagnosis Date   Arthritis    Cancer ([place])    endometrial   Constipation    Diabetes mellitus without complication ([place])    takes metformin   Frequent falls    Gait disorder    Incontinence of bowel    Incontinence of urine    Intellectual disability    severe intellectual disability, non-verbal   Osteopenia    Seizures ([place])    remote history; possible febrile childhood seizure   Past Surgical History:  Procedure Laterality Date   ABDOMINAL HYSTERECTOMY     BREAST LUMPECTOMY Left 03/04/2013   Procedure: LEFT BREAST LUMPECTOMY;  Surgeon: [name], MD;  Location: [facility];  Service: General;  Laterality: Left;   RADIOLOGY WITH ANESTHESIA N/A 09/16/2016   Procedure: RADIOLOGY WITH ANESTHESIA MRI OF THE BRAIN WITH AND WITHOUT;  Surgeon: Radiologist, Medication, MD;  Location: [facility];  Service: Radiology;  Laterality: N/A;   RADIOLOGY WITH ANESTHESIA N/A 09/15/2017    Procedure: MRI WITH ANESTHESIA OF THE BRAIN WITH AND WITHOUT AND CERVICAL WITHOUT CONTRAST;  Surgeon: Radiologist, Medication, MD;  Location: [facility];  Service: Radiology;  Laterality: N/A;   FAMILY HISTORY No family history on file.     GYNECOLOGIC HISTORY: Status post hysterectomy     SOCIAL HISTORY:  The patient lives in a group home.                          ADVANCED DIRECTIVES: Her legal caregiver, Paula Cantu, lives in [place]. She can be reached at [phone]. She has agreed to oral  antiestrogen treatment for this patient.  Allergies  Allergen Reactions   Lactose Intolerance (Gi) Other (See Comments)    On nursing home records   Bacillus Other (See Comments)    UNSPECIFIED REACTION    Ppd [Tuberculin Purified Protein Derivative] Other (See Comments)    UNSPECIFIED REACTION  On nursing home records    PHYSICAL EXAMINATION: ECOG PERFORMANCE STATUS: 4 - Bedbound  Vitals:   09/24/20 2037 09/25/20 0457  BP: 119/86 (!) 139/96  Pulse: (!) 130 96  Resp:  14  Temp: 98 F (36.7 C) 99.8 F (37.7 C)  SpO2: 90%    Filed Weights   09/21/20 1608 09/22/20 1635  Weight: 47.2  kg 57.7 kg    Intake/Output from previous day: 07/11 0701 - 07/12 0700 In: 240 [P.O.:240] Out: 500 [Urine:500]  GENERAL: Awake, nonverbal SKIN: skin color, texture, turgor are normal, no rashes or significant lesions Breasts: Palpable mass in the right breast LUNGS: Diminished breath sound HEART: Tachycardic NEURO: alert & oriented x 3 with fluent speech, no focal motor/sensory deficits  LABORATORY DATA:  I have reviewed the data as listed CMP Latest Ref Rng & Units 09/25/2020 09/23/2020 09/22/2020  Glucose 70 - 99 mg/dL 114(H) 100(H) 100(H)  BUN 8 - 23 mg/dL 12 7(L) 8  Creatinine 0.44 - 1.00 mg/dL 0.50 0.58 0.53  Sodium 135 - 145 mmol/L 142 143 142  Potassium 3.5 - 5.1 mmol/L 4.0 3.9 3.9  Chloride 98 - 111 mmol/L 111 109 110  CO2 22 - 32 mmol/L _0 Calcium 8.9 - 10.3 mg/dL 8.2(L)  8.1(L) 8.1(L)  Total Protein 6.5 - 8.1 g/dL - 5.4(L) 5.8(L)  Total Bilirubin 0.3 - 1.2 mg/dL - 0.5 0.5  Alkaline Phos 38 - 126 U/L - 35(L) 36(L)  AST 15 - 41 U/L - 25 21  ALT 0 - 44 U/L - 20 16    Lab Results  Component Value Date   WBC 12.0 (H) 09/25/2020   HGB 12.9 09/25/2020   HCT 40.2 09/25/2020   MCV 93.9 09/25/2020   PLT 238 09/25/2020   NEUTROABS 5.8 09/22/2020    CT HEAD W & WO CONTRAST  Result Date: 09/24/2020 CLINICAL DATA:  Cancer of unknown primary, staging. EXAM: CT HEAD WITHOUT AND WITH CONTRAST TECHNIQUE: Contiguous axial images were obtained from the base of the skull through the vertex without and with intravenous contrast CONTRAST:  14m OMNIPAQUE IOHEXOL 300 MG/ML  SOLN COMPARISON:  Head CT March 14, 2020. FINDINGS: Brain: At least 4 enhancing lesions are seen in the right cerebellar hemisphere, the largest measuring approximately 1.2 cm. No significant edema or mass effect. No evidence of acute infarction, hemorrhage, hydrocephalus, or extra-axial collection. Mild hypodensity of the periventricular white matter, nonspecific, most likely related to chronic small vessel ischemia. Vascular: No hyperdense vessel or unexpected calcification. Visible vessels are patent. Skull: Normal. Negative for fracture or focal lesion. Sinuses/Orbits: No acute finding. Other: Left mastoid and middle ear opacification. Soft tissue density within the bilateral external auditory canals. IMPRESSION: At least 4 enhancing lesions are seen in the right cerebellar hemisphere, the largest measuring approximately 1.2 cm. Given provided history, this is concerning for metastatic disease. Correlation with MRI of the brain without and with contrast is recommended. Electronically Signed   By: KPedro EarlsM.D.   On: 09/24/2020 16:23   CT Angio Chest PE W/Cm &/Or Wo Cm  Result Date: 09/21/2020 CLINICAL DATA:  Chest and abdominal pain with foul-smelling urine, initial encounter EXAM: CT  ANGIOGRAPHY CHEST CT ABDOMEN AND PELVIS WITH CONTRAST TECHNIQUE: Multidetector CT imaging of the chest was performed using the standard protocol during bolus administration of intravenous contrast. Multiplanar CT image reconstructions and MIPs were obtained to evaluate the vascular anatomy. Multidetector CT imaging of the abdomen and pelvis was performed using the standard protocol during bolus administration of intravenous contrast. CONTRAST:  1092mOMNIPAQUE IOHEXOL 350 MG/ML SOLN COMPARISON:  Chest x-ray from earlier in the same day, CT from 04/25/2020 FINDINGS: CTA CHEST FINDINGS Cardiovascular: Thoracic aorta demonstrates mild atherosclerotic calcifications. No aneurysmal dilatation or dissection is noted. The heart is not significantly enlarged in size. The pulmonary artery shows a normal branching pattern bilaterally.  No filling defect to suggest pulmonary embolism is seen. Coronary calcifications are noted. Mediastinum/Nodes: Thoracic inlet is within normal limits. Scattered noncalcified hilar lymph nodes are noted bilaterally. The largest of these on the left measures approximately 12 mm in short axis. Largest of these on the right measures approximately 11 mm in short axis. A few calcified lymph nodes are noted within the right hilum likely related to prior granulomatous disease. The esophagus as visualized appears within normal limits. Lungs/Pleura: Large right-sided pleural effusion is noted. The residual right lung is well aerated with scattered pulmonary nodules identified. The largest of these lies in the right upper lobe best seen on image number 44 of series 4 measuring approximately 7 mm. This was not well appreciated on the recent exam from February of this year. Consolidation in the right lower lobe is noted related to the underlying effusion. Multiple nodular densities are noted throughout the left lower lobe which have increased in the interval from the prior exam. The largest of these is noted  on image number 68 of series 4. This nodule was not present on the prior exam. No sizable effusion on the left is noted. Scattered smaller left-sided nodules are seen. The previously seen lower lobe nodule has increased in size now measuring approximately 10 mm in dimension. Musculoskeletal: Degenerative changes of the thoracic spine are noted. No discrete lytic or sclerotic lesions are seen. There is however a soft tissue mass lesion in the right breast which was not well appreciated on the prior exam due to the imaging technique. This measures approximately 7.0 x 4.5 cm in greatest dimension. Delayed images demonstrates some peripheral enhancement particularly inferiorly with central necrosis. This is suspicious for underlying breast neoplasm until proven otherwise. No sizable associated adenopathy is noted. Review of the MIP images confirms the above findings. CT ABDOMEN and PELVIS FINDINGS Hepatobiliary: Liver is well visualized and mildly fatty infiltrated. No focal mass is seen. Changes of cholelithiasis are seen without complicating factors. Pancreas: Unremarkable. No pancreatic ductal dilatation or surrounding inflammatory changes. Spleen: Normal in size without focal abnormality. Adrenals/Urinary Tract: Adrenal glands are within normal limits. Kidneys demonstrate a normal enhancement pattern bilaterally. No renal calculi or obstructive changes are seen. The ureters are within normal limits. The bladder is well distended. Stomach/Bowel: Colon shows no obstructive or inflammatory changes. Visualized small bowel is within normal limits. The appendix is not well appreciated. No inflammatory changes to suggest appendicitis are noted. Small bowel and stomach are unremarkable. Vascular/Lymphatic: Aortic atherosclerosis. No enlarged abdominal or pelvic lymph nodes. Reproductive: Status post hysterectomy. No adnexal masses. Other: No abdominal wall hernia or abnormality. No abdominopelvic ascites. Musculoskeletal:  Compression deformities of T11 and L2 are seen. These appear chronic in nature. No lytic or sclerotic lesion is seen. Review of the MIP images confirms the above findings. IMPRESSION: CTA of the chest: No evidence of pulmonary emboli. Large right-sided pleural effusion. Multiple pulmonary nodules are noted bilaterally suspicious for metastatic disease. These have increased in both size and number when compare with the prior exam of February of 2022. Some associated hilar adenopathy is noted. Large right breast mass with peripheral enhancement and central necrosis highly suspicious for a breast neoplasm. Diagnostic mammography and tissue sampling is recommended as clinically indicated. CT of the abdomen and pelvis: Cholelithiasis without complicating factors. T11 and L2 compression deformities which appear chronic in nature. No other focal abnormality is noted. Electronically Signed   By: Inez Catalina M.D.   On: 09/21/2020 21:15   CT  Abdomen Pelvis W Contrast  Result Date: 09/21/2020 CLINICAL DATA:  Chest and abdominal pain with foul-smelling urine, initial encounter EXAM: CT ANGIOGRAPHY CHEST CT ABDOMEN AND PELVIS WITH CONTRAST TECHNIQUE: Multidetector CT imaging of the chest was performed using the standard protocol during bolus administration of intravenous contrast. Multiplanar CT image reconstructions and MIPs were obtained to evaluate the vascular anatomy. Multidetector CT imaging of the abdomen and pelvis was performed using the standard protocol during bolus administration of intravenous contrast. CONTRAST:  158m OMNIPAQUE IOHEXOL 350 MG/ML SOLN COMPARISON:  Chest x-ray from earlier in the same day, CT from 04/25/2020 FINDINGS: CTA CHEST FINDINGS Cardiovascular: Thoracic aorta demonstrates mild atherosclerotic calcifications. No aneurysmal dilatation or dissection is noted. The heart is not significantly enlarged in size. The pulmonary artery shows a normal branching pattern bilaterally. No filling defect  to suggest pulmonary embolism is seen. Coronary calcifications are noted. Mediastinum/Nodes: Thoracic inlet is within normal limits. Scattered noncalcified hilar lymph nodes are noted bilaterally. The largest of these on the left measures approximately 12 mm in short axis. Largest of these on the right measures approximately 11 mm in short axis. A few calcified lymph nodes are noted within the right hilum likely related to prior granulomatous disease. The esophagus as visualized appears within normal limits. Lungs/Pleura: Large right-sided pleural effusion is noted. The residual right lung is well aerated with scattered pulmonary nodules identified. The largest of these lies in the right upper lobe best seen on image number 44 of series 4 measuring approximately 7 mm. This was not well appreciated on the recent exam from February of this year. Consolidation in the right lower lobe is noted related to the underlying effusion. Multiple nodular densities are noted throughout the left lower lobe which have increased in the interval from the prior exam. The largest of these is noted on image number 68 of series 4. This nodule was not present on the prior exam. No sizable effusion on the left is noted. Scattered smaller left-sided nodules are seen. The previously seen lower lobe nodule has increased in size now measuring approximately 10 mm in dimension. Musculoskeletal: Degenerative changes of the thoracic spine are noted. No discrete lytic or sclerotic lesions are seen. There is however a soft tissue mass lesion in the right breast which was not well appreciated on the prior exam due to the imaging technique. This measures approximately 7.0 x 4.5 cm in greatest dimension. Delayed images demonstrates some peripheral enhancement particularly inferiorly with central necrosis. This is suspicious for underlying breast neoplasm until proven otherwise. No sizable associated adenopathy is noted. Review of the MIP images  confirms the above findings. CT ABDOMEN and PELVIS FINDINGS Hepatobiliary: Liver is well visualized and mildly fatty infiltrated. No focal mass is seen. Changes of cholelithiasis are seen without complicating factors. Pancreas: Unremarkable. No pancreatic ductal dilatation or surrounding inflammatory changes. Spleen: Normal in size without focal abnormality. Adrenals/Urinary Tract: Adrenal glands are within normal limits. Kidneys demonstrate a normal enhancement pattern bilaterally. No renal calculi or obstructive changes are seen. The ureters are within normal limits. The bladder is well distended. Stomach/Bowel: Colon shows no obstructive or inflammatory changes. Visualized small bowel is within normal limits. The appendix is not well appreciated. No inflammatory changes to suggest appendicitis are noted. Small bowel and stomach are unremarkable. Vascular/Lymphatic: Aortic atherosclerosis. No enlarged abdominal or pelvic lymph nodes. Reproductive: Status post hysterectomy. No adnexal masses. Other: No abdominal wall hernia or abnormality. No abdominopelvic ascites. Musculoskeletal: Compression deformities of T11 and L2 are seen.  These appear chronic in nature. No lytic or sclerotic lesion is seen. Review of the MIP images confirms the above findings. IMPRESSION: CTA of the chest: No evidence of pulmonary emboli. Large right-sided pleural effusion. Multiple pulmonary nodules are noted bilaterally suspicious for metastatic disease. These have increased in both size and number when compare with the prior exam of February of 2022. Some associated hilar adenopathy is noted. Large right breast mass with peripheral enhancement and central necrosis highly suspicious for a breast neoplasm. Diagnostic mammography and tissue sampling is recommended as clinically indicated. CT of the abdomen and pelvis: Cholelithiasis without complicating factors. T11 and L2 compression deformities which appear chronic in nature. No other  focal abnormality is noted. Electronically Signed   By: Inez Catalina M.D.   On: 09/21/2020 21:15   DG Chest Portable 1 View  Result Date: 09/21/2020 CLINICAL DATA:  Short of breath, tachycardia EXAM: PORTABLE CHEST 1 VIEW COMPARISON:  04/25/2020 FINDINGS: Left lung grossly clear. Asymmetric opacity in the right thorax likely due to moderate layering effusion. Airspace disease at the right base. Stable cardiomediastinal silhouette. IMPRESSION: Asymmetric opacity in the right thorax suspected to be secondary to at least moderate layering pleural effusion. Airspace disease at right base may reflect atelectasis or pneumonia Electronically Signed   By: Donavan Foil M.D.   On: 09/21/2020 17:30    ASSESSMENT: 75 y.o. [facility] resident status post left lumpectomy with no sentinel lymph node sampling 03/04/2013 for a pT2 pNX, stage II invasive ductal carcinoma, grade 3, estrogen receptor 91% positive, progesterone receptor 12% positive, with an MIB-1 of 19%.   (1) tamoxifen started 04/13/2013  (2) May 2022-right breast mass palpated  (3) CTA chest 09/21/2020-large right pleural effusion, multiple pulmonary nodules suspicious for metastatic disease, large right breast mass with peripheral enhancement and central necrosis highly suspicious for a breast neoplasm  (4) CT-guided thoracentesis 09/24/2020-cytology pending  (5) anastrozole started 09/25/2020     PLAN: Jahniah is now 7-1/2 years out from definitive surgery for her breast cancer.  She was found to have a right breast mass which required further evaluation in May 2022.  A mammogram was ordered but this was not performed.  Her CT scan on admission showed the right breast mass which is concerning for right breast neoplasm.  She was also found to have pulmonary nodules and a large right pleural effusion.  These findings are concerning for right breast cancer with metastases.  The cytology from the right thoracentesis is currently pending.  Will  discontinue tamoxifen at this time.  We will begin anastrozole 1 mg daily.  The patient is not a candidate for systemic chemotherapy.  Would recommend changing CODE STATUS to DNR.  I have discussed this patient with palliative care who is planning for goals of care meeting with the patient's POA for further discussion regarding CODE STATUS.     LOS: 4 days   Mikey Bussing, DNP, AGPCNP-BC, AOCNP 09/25/20   ADDENDUM: I spoke with the patient's HCPOA, Paula Cantu, and explained that as far as we can tell Kailah now has metastatic cancer, most likely breast cancer; this is either her original left breast cancer that "hid" in her body all these years and now is growing, or a new right-sided breast cancer (she has a right breast mass which has not been biopsied). We discussed thefact that stage IV breast cancer is not curable. We have many treatment options, and these can extend a person';s life. However treatments, even the simpest ones  like anastrozole, have some side effects; and they can extend a person's life without improving a person's quality of life--in fact can make that worse.  We discussed the fact the [place] should not be resuscitated in case of a terminal event--Paula Cantu (who lost her husband and one son in the last year, and has a son battling lung cancer currently) understtood this well. She agreed that a DNR order is appropriate and confirmed it with one of out RNs/LPNs [employer]. Accordingly I entered a DNR order.  I told Paula Cantu that if Jennett was my sister I would want her to be comfortable. I would not want her life extended just to be extended if there is going to be suffering--as there is going to be in this case. I would opt for best supportive care, with aggressive treatment of symptoms but no treatment just to prolong life. I quoted her a prognosis of less than 6 months under those circumstances--that incidentally would qualify [facility] for Hospice services.  We left it that I would  call Paula Cantu again tomorrow hopefully with at least preliminary results from the pleural fluid cytology.  She wants [place] to know [place] loves room-temperature coffee. I will pass that on to her caregivers.   I personally saw this patient and performed a substantive portion of this encounter with the listed APP documented above.   Chauncey Cruel, MD Medical Oncology and Hematology [facility] [address] [place], [place] [address] Tel. [phone]    Fax. [phone]

## 2020-09-26 DIAGNOSIS — J9601 Acute respiratory failure with hypoxia: Secondary | ICD-10-CM

## 2020-09-26 DIAGNOSIS — C50812 Malignant neoplasm of overlapping sites of left female breast: Secondary | ICD-10-CM

## 2020-09-26 DIAGNOSIS — Z515 Encounter for palliative care: Secondary | ICD-10-CM | POA: Diagnosis not present

## 2020-09-26 DIAGNOSIS — Z170 Estrogen receptor positive status [ER+]: Secondary | ICD-10-CM | POA: Diagnosis not present

## 2020-09-26 DIAGNOSIS — J90 Pleural effusion, not elsewhere classified: Secondary | ICD-10-CM | POA: Diagnosis not present

## 2020-09-26 DIAGNOSIS — Z7189 Other specified counseling: Secondary | ICD-10-CM | POA: Diagnosis not present

## 2020-09-26 LAB — CULTURE, BLOOD (ROUTINE X 2)
Culture: NO GROWTH
Culture: NO GROWTH
Special Requests: ADEQUATE
Special Requests: ADEQUATE

## 2020-09-26 NOTE — Progress Notes (Signed)
Paula Cantu  ZOX:[id number] DOB: 22-Jan-1946 DOA: 09/21/2020 PCP: Trinidad Curet    Brief Narrative:  75 year old with a history of metastatic breast cancer, cognitive impairment, and DM2 who presented to the ED with increased urinary frequency.  Patient is nonverbal at baseline.  There was a report of several days of malodorous dark frequent urine.  The group home where she lives and was needing to provide much more assistance than usual.  She was transported to the ED where a UA was negative.  She was noted to be intermittently hypoxic.  CTa chest revealed no evidence of pulmonary embolism but did note pulmonary nodules and a significant pleural effusion along with a right breast mass.  She underwent thoracentesis  Consultants:  Oncology IR Palliative Care  Code Status: NO CODE BLUE  Antimicrobials:  None  DVT prophylaxis: SCDs  Subjective: Afebrile.  Tachycardic in the 120-130 range.  Blood pressure stable.  Saturations stable on oxygen support.  Is resting quietly in bed with no evidence of discomfort.  Does not object to me auscultating her lungs and heart sounds.  Assessment & Plan:  Large right malignant pleural effusion -acute hypoxic respiratory failure Status post thoracentesis with 900 cc of blood-tinged fluid drained -patient was combative and therefore pigtail chest tube was not able to be placed -appears symptomatically improved -saturations stable on room air -cytology from fluid consistent with metastatic adenocarcinoma  Stage IV breast cancer - brain mets - lung mets  Oncology following -CT head 7/11 confirmed at least 4 enhancing brain lesions -further aggressive therapy not felt to be appropriate or beneficial  Sinus tachycardia A physiologic response to anxiety and the above acute medical issues  Severe intellectual disability -nonverbal baseline Lives in a group home  DM2 No indication for strict CBG control -monitor to avoid extremes  Goals of  care Cytology of pleural fluid confirms metastatic breast cancer - medical team feels comfort care is most appropriate   Family Communication:  Status is: Inpatient  Remains inpatient appropriate because:Inpatient level of care appropriate due to severity of illness  Dispo: The patient is from: Group home              Anticipated d/c is to:  unclear              Patient currently is not medically stable to d/c.   Difficult to place patient No   Objective: Blood pressure (!) 155/86, pulse (!) 135, temperature 98.9 F (37.2 C), temperature source Axillary, resp. rate 18, height 4' (1.219 m), weight 57.7 kg, SpO2 91 %.  Intake/Output Summary (Last 24 hours) at 09/26/2020 0857 Last data filed at 09/25/2020 1843 Gross per 24 hour  Intake 280 ml  Output 250 ml  Net 30 ml   Filed Weights   09/21/20 1608 09/22/20 1635  Weight: 47.2 kg 57.7 kg    Examination: General: No acute respiratory distress Lungs: Poor air movement bilateral bases with no wheezing Cardiovascular: Regular rate and rhythm without murmur gallop or rub normal S1 and S2 Abdomen: Nontender, nondistended, soft, bowel sounds positive Extremities: 1+ bilateral lower extremity edema  CBC: Recent Labs  Lab 09/21/20 1622 09/22/20 1008 09/23/20 0317 09/25/20 0334  WBC 10.4 9.4 8.1 12.0*  NEUTROABS 8.1* 5.8  --   --   HGB 11.8* 12.5 12.1 12.9  HCT 36.7 39.7 37.8 40.2  MCV 94.8 95.9 94.5 93.9  PLT 217 216 201 811   Basic Metabolic Panel: Recent Labs  Lab 09/22/20 1008  09/23/20 0317 09/25/20 0334  NA 142 143 142  K 3.9 3.9 4.0  CL 110 109 111  CO2 25 29 23   GLUCOSE 100* 100* 114*  BUN 8 7* 12  CREATININE 0.53 0.58 0.50  CALCIUM 8.1* 8.1* 8.2*  MG 2.0  --  2.2  PHOS  --   --  4.4   GFR: Estimated Creatinine Clearance: 32.9 mL/min (by C-G formula based on SCr of 0.5 mg/dL).  Liver Function Tests: Recent Labs  Lab 09/21/20 1622 09/22/20 1008 09/23/20 0317  AST 17 21 25   ALT 12 16 20   ALKPHOS  33* 36* 35*  BILITOT 0.5 0.5 0.5  PROT 5.7* 5.8* 5.4*  ALBUMIN 3.0* 3.0* 2.8*   Recent Labs  Lab 09/21/20 1622  LIPASE 32    Coagulation Profile: Recent Labs  Lab 09/22/20 1008  INR 1.0    HbA1C: Hgb A1c MFr Bld  Date/Time Value Ref Range Status  03/14/2020 04:11 AM 5.4 4.8 - 5.6 % Final    Comment:    (NOTE) Pre diabetes:          5.7%-6.4%  Diabetes:              >6.4%  Glycemic control for   <7.0% adults with diabetes     CBG: Recent Labs  Lab 09/21/20 1634  GLUCAP 114*    Recent Results (from the past 240 hour(s))  Blood culture (routine x 2)     Status: None (Preliminary result)   Collection Time: 09/21/20  4:22 PM   Specimen: BLOOD  Result Value Ref Range Status   Specimen Description   Final    BLOOD RIGHT ANTECUBITAL Performed at [facility], [place] [address] [place], [place] [address]    Special Requests   Final    BOTTLES DRAWN AEROBIC AND ANAEROBIC Blood Culture adequate volume Performed at [place] [address], [place], [place] [address]    Culture   Final    NO GROWTH 4 DAYS Performed at [facility], [place] [address] [place], [place] [address]    Report Status PENDING  Incomplete  Blood culture (routine x 2)     Status: None (Preliminary result)   Collection Time: 09/21/20  4:27 PM   Specimen: BLOOD  Result Value Ref Range Status   Specimen Description   Final    BLOOD LEFT ANTECUBITAL Performed at [place] [address] [place], [place] [address]    Special Requests   Final    BOTTLES DRAWN AEROBIC AND ANAEROBIC Blood Culture adequate volume Performed at [place] [address] [place], [place] [address]    Culture   Final    NO GROWTH 4 DAYS Performed at [facility], [place] [address] [place], [place] [address]    Report Status PENDING  Incomplete  Resp Panel by RT-PCR (Flu A&B, Covid) Nasopharyngeal Swab     Status: None    Collection Time: 09/21/20  7:04 PM   Specimen: Nasopharyngeal Swab; Nasopharyngeal(NP) swabs in vial transport medium  Result Value Ref Range Status   SARS Coronavirus 2 by RT PCR NEGATIVE NEGATIVE Final    Comment: (NOTE) SARS-CoV-2 target nucleic acids are NOT DETECTED.  The SARS-CoV-2 RNA is generally detectable in upper respiratory specimens during the acute phase of infection. The lowest concentration of SARS-CoV-2 viral copies this assay can detect is 138 copies/mL. A negative result does not preclude SARS-Cov-2 infection and should not  be used as the sole basis for treatment or other patient management decisions. A negative result may occur with  improper specimen collection/handling, submission of specimen other than nasopharyngeal swab, presence of viral mutation(s) within the areas targeted by this assay, and inadequate number of viral copies(<138 copies/mL). A negative result must be combined with clinical observations, patient history, and epidemiological information. The expected result is Negative.  Fact Sheet for Patients:  EntrepreneurPulse.com.au  Fact Sheet for Healthcare Providers:  IncredibleEmployment.be  This test is no t yet approved or cleared by the [place] FDA and  has been authorized for detection and/or diagnosis of SARS-CoV-2 by FDA under an Emergency Use Authorization (EUA). This EUA will remain  in effect (meaning this test can be used) for the duration of the COVID-19 declaration under Section 564(b)(1) of the Act, 21 U.S.C.section 360bbb-3(b)(1), unless the authorization is terminated  or revoked sooner.       Influenza A by PCR NEGATIVE NEGATIVE Final   Influenza B by PCR NEGATIVE NEGATIVE Final    Comment: (NOTE) The Xpert Xpress SARS-CoV-2/FLU/RSV plus assay is intended as an aid in the diagnosis of influenza from Nasopharyngeal swab specimens and should not be used as a sole basis for treatment.  Nasal washings and aspirates are unacceptable for Xpert Xpress SARS-CoV-2/FLU/RSV testing.  Fact Sheet for Patients: EntrepreneurPulse.com.au  Fact Sheet for Healthcare Providers: IncredibleEmployment.be  This test is not yet approved or cleared by the [place] FDA and has been authorized for detection and/or diagnosis of SARS-CoV-2 by FDA under an Emergency Use Authorization (EUA). This EUA will remain in effect (meaning this test can be used) for the duration of the COVID-19 declaration under Section 564(b)(1) of the Act, 21 U.S.C. section 360bbb-3(b)(1), unless the authorization is terminated or revoked.  Performed at [facility], [place] [address], [place], [place] [address]   Urine culture     Status: Abnormal   Collection Time: 09/21/20  7:37 PM   Specimen: Urine, Random  Result Value Ref Range Status   Specimen Description   Final    URINE, RANDOM Performed at [place] [address], [place], [place] [address]    Special Requests   Final    NONE Performed at [facility], [place] [address] [place], [place] [address]    Culture 10,000 COLONIES/mL ESCHERICHIA COLI (A)  Final   Report Status 09/24/2020 FINAL  Final   Organism ID, Bacteria ESCHERICHIA COLI (A)  Final      Susceptibility   Escherichia coli - MIC*    AMPICILLIN 4 SENSITIVE Sensitive     CEFAZOLIN <=4 SENSITIVE Sensitive     CEFEPIME <=0.12 SENSITIVE Sensitive     CEFTRIAXONE <=0.25 SENSITIVE Sensitive     CIPROFLOXACIN >=4 RESISTANT Resistant     GENTAMICIN <=1 SENSITIVE Sensitive     IMIPENEM <=0.25 SENSITIVE Sensitive     NITROFURANTOIN <=16 SENSITIVE Sensitive     TRIMETH/SULFA <=20 SENSITIVE Sensitive     AMPICILLIN/SULBACTAM <=2 SENSITIVE Sensitive     PIP/TAZO <=4 SENSITIVE Sensitive     * 10,000 COLONIES/mL ESCHERICHIA COLI     Scheduled Meds:  anastrozole  1 mg Oral Daily   vitamin C  500  mg Oral BID   cholecalciferol  4,000 Units Oral Daily   divalproex  250 mg Oral BID   omega-3 acid ethyl esters  1 g Oral Daily   senna-docusate  2 tablet Oral QHS     LOS: 5 days  Cherene Altes, MD [facility] Office  [phone] Pager - Text Page per Amion  If 7PM-7AM, please contact night-coverage per Amion 09/26/2020, 8:57 AM

## 2020-09-26 NOTE — Progress Notes (Signed)
Daily Progress Note   Patient Name: Paula Cantu       Date: 09/26/2020 DOB: [birth year]  Age: 75 y.o. MRN#: [id number] Attending Physician: Cherene Altes, MD Primary Care Physician: Trinidad Curet Admit Date: 09/21/2020  Reason for Consultation/Follow-up: Establishing goals of care  Subjective: I saw and examined Paula Cantu this evening.  She is lying in bed in no distress.  She remains nonverbal.  Length of Stay: 5  Current Medications: Scheduled Meds:   anastrozole  1 mg Oral Daily   divalproex  250 mg Oral BID   senna-docusate  2 tablet Oral QHS    Continuous Infusions:   PRN Meds: acetaminophen **OR** acetaminophen, LORazepam, metoprolol tartrate, promethazine  Physical Exam         General: Alert, awake, in no acute distress.  Nonverbal. HEENT: No bruits, no goiter, no JVD Heart: Regular rate and rhythm. No murmur appreciated. Lungs: Decreased air movement, clear Abdomen: Soft, nontender, nondistended, positive bowel sounds.   Ext: No significant edema Skin: Warm and dry  Vital Signs: BP (!) 129/94   Pulse (!) 139   Temp 98.3 F (36.8 C) (Axillary)   Resp 18   Ht 4' (1.219 m)   Wt 57.7 kg   SpO2 95%   BMI 38.85 kg/m  SpO2: SpO2: 95 % O2 Device: O2 Device: Room Air O2 Flow Rate: O2 Flow Rate (L/min): 10 L/min  Intake/output summary:  Intake/Output Summary (Last 24 hours) at 09/26/2020 1847 Last data filed at 09/26/2020 1840 Gross per 24 hour  Intake 280 ml  Output 250 ml  Net 30 ml   LBM:   Baseline Weight: Weight: 47.2 kg Most recent weight: Weight: 57.7 kg       Palliative Assessment/Data:    Flowsheet Rows    Flowsheet Row Most Recent Value  Intake Tab   Referral Department Hospitalist  Unit at Time of Referral Med/Surg Unit   Palliative Care Primary Diagnosis Cancer  Date Notified 09/22/20  Palliative Care Type New Palliative care  Reason for referral Clarify Goals of Care  Date of Admission 09/21/20  Date first seen by Palliative Care 09/25/20  # of days Palliative referral response time 3 Day(s)  # of days IP prior to Palliative referral 1  Clinical Assessment   Palliative Performance Scale Score 40%  Psychosocial & Spiritual Assessment   Palliative Care Outcomes   Patient/Family meeting held? Yes  Who was at the meeting? Cousin via phone       Patient Active Problem List   Diagnosis Date Noted   Pleural effusion 09/21/2020   Pneumonia due to COVID-19 virus 03/15/2020   Acute hypoxemic respiratory failure due to COVID-19 [facility]) 03/14/2020   Sinus tachycardia 03/14/2020   Acute encephalopathy 03/14/2020   Malignant neoplasm of overlapping sites of left breast in female, estrogen receptor positive ([place]) 11/09/2018   Mental disability 11/24/2017   Falling 08/12/2017   Idiopathic intellectual disability 04/13/2013    Palliative Care Assessment & Plan   Patient Profile: 75 y.o. female  with past medical history of metastatic breast cancer, cognitive impairment, type 2 diabetes and who is long-term resident of group home admitted on 09/21/2020 with weakness and difficulty breathing.  She was noted to have dark and frequent urine but UA was negative.  She was found to be intermittently hypoxic and CTA ruled out PE but found pulmonary nodules, pleural effusion, and right breast mass.  She had thoracentesis and appears to be breathing more comfortably following this.  Palliative consulted to discuss goals of care.  Recommendations/Plan: DNR/DNI-durable DNR placed on chart Plan to return to [facility] at time of discharge. Recommend palliative care to follow as an outpatient   Code Status:    Code Status Orders  (From admission, onward)           Start     Ordered   09/25/20 1927  Do not attempt  resuscitation (DNR)  Continuous       Question Answer Comment  In the event of cardiac or respiratory ARREST Do not call a "code blue"   In the event of cardiac or respiratory ARREST Do not perform Intubation, CPR, defibrillation or ACLS   In the event of cardiac or respiratory ARREST Use medication by any route, position, wound care, and other measures to relive pain and suffering. May use oxygen, suction and manual treatment of airway obstruction as needed for comfort.      09/25/20 1926           Code Status History     Date Active Date Inactive Code Status Order ID Comments User Context   09/22/2020 1236 09/25/2020 1926 Full Code [id number]  Jonnie Finner, DO ED   03/14/2020 0411 03/20/2020 0352 Full Code [id number]  Vianne Bulls, MD ED        Discharge Planning: Home with Palliative Services  Thank you for allowing the Palliative Medicine Team to assist in the care of this patient.   Total Time 15 Prolonged Time Billed No      Greater than 50%  of this time was spent counseling and coordinating care related to the above assessment and plan.  Micheline Rough, MD  Please contact Palliative Medicine Team phone at [phone] for questions and concerns.

## 2020-09-26 NOTE — TOC Initial Note (Addendum)
Transition of Care [facility]) - Initial/Assessment Note    Patient Details  Name: Paula Cantu MRN: [id number] Date of Birth: 04-24-1945  Transition of Care [facility]) CM/SW Contact:    Paula Ludwig, LCSW Phone Number: 09/26/2020, 5:58 PM  Clinical Narrative:                  Patient is a 75 year old female who is from [facility] [place] [facility] home on [address].  Patient has some confusion, CSW spoke to patient's Paula Cantu to discuss discharge planning.  Patient's HCPOA stated she would like patient to return back to group home once she is medically ready.  CSW to contact group home to see if they can accept patient back.  CSW to continue to follow patient's progress throughout discharge planning.   Expected Discharge Plan: Group Home Barriers to Discharge: Continued Medical Work up   Patient Goals and CMS Choice Patient states their goals for this hospitalization and ongoing recovery are:: Per patient's HCPOA, plan is to return back to [place] group home. CMS Medicare.gov Compare Post Acute Care list provided to:: Patient Represenative (must comment) Choice offered to / list presented to : [place] / Guardian  Expected Discharge Plan and Services Expected Discharge Plan: Group Home       Living arrangements for the past 2 months: Group Home                                      Prior Living Arrangements/Services Living arrangements for the past 2 months: Group Home Lives with:: Facility Resident Patient language and need for interpreter reviewed:: Yes Do you feel safe going back to the place where you live?: Yes      Need for Family Participation in Patient Care: Yes (Comment) Care giver support system in place?: Yes (comment)   Criminal Activity/Legal Involvement Pertinent to Current Situation/Hospitalization: No - Comment as needed  Activities of Daily Living Home Assistive Devices/Equipment: Hospital bed ADL Screening (condition at time of admission) Patient's  cognitive ability adequate to safely complete daily activities?: No Is the patient deaf or have difficulty hearing?: Yes Does the patient have difficulty seeing, even when wearing glasses/contacts?: Yes Does the patient have difficulty concentrating, remembering, or making decisions?: Yes Patient able to express need for assistance with ADLs?: No Does the patient have difficulty dressing or bathing?: Yes Independently performs ADLs?: No Communication: Dependent Is this a change from baseline?: Pre-admission baseline Dressing (OT): Dependent Is this a change from baseline?: Pre-admission baseline Grooming: Dependent Is this a change from baseline?: Pre-admission baseline Feeding: Dependent Is this a change from baseline?: Pre-admission baseline Bathing: Dependent Is this a change from baseline?: Pre-admission baseline Toileting: Dependent Is this a change from baseline?: Pre-admission baseline In/Out Bed: Dependent Is this a change from baseline?: Pre-admission baseline Walks in Home: Dependent Is this a change from baseline?: Pre-admission baseline Does the patient have difficulty walking or climbing stairs?: Yes Weakness of Legs: Both Weakness of Arms/Hands: Both  Permission Sought/Granted Permission sought to share information with : Facility Sport and exercise psychologist, Family Supports Permission granted to share information with : Yes, Release of Information Signed  Share Information with NAME: Walker,(Cousin)Cantu Legal Guardian [phone]  [phone]  Paula Cantu   [phone], Paula Cantu group home manager [phone]  Permission granted to share Paula Cantu agency and group home.        Emotional Assessment  Appearance:: Appears stated age   Affect (typically observed): Accepting, Appropriate, Calm Orientation: : Oriented to Self Alcohol / Substance Use: Not Applicable Psych Involvement: No (comment)  Admission diagnosis:  Pleural effusion  [J90] Pleural effusion on right [J90] Acute respiratory failure with hypoxia ([place]) [J96.01] Patient Active Problem List   Diagnosis Date Noted   Pleural effusion 09/21/2020   Pneumonia due to COVID-19 virus 03/15/2020   Acute hypoxemic respiratory failure due to COVID-19 [facility]) 03/14/2020   Sinus tachycardia 03/14/2020   Acute encephalopathy 03/14/2020   Malignant neoplasm of overlapping sites of left breast in female, estrogen receptor positive ([place]) 11/09/2018   Mental disability 11/24/2017   Falling 08/12/2017   Idiopathic intellectual disability 04/13/2013   PCP:  Trinidad Curet Pharmacy:   [id number] [place], [place] [address] [place] [address] Phone: [phone] Fax: [phone]     Social Determinants of Health (SDOH) Interventions    Readmission Risk Interventions No flowsheet data found.

## 2020-09-26 NOTE — Progress Notes (Signed)
Cytology from the pleural fluid obtained 09/24/2020 shows malignant cells c/w adenocarcinoma; prognostic panel is pending.  Discussed with HCPOA/legal guardian Katharine Look. She feels what Madalyn really wants is to get back to the [facility] ("home"). Katharine Look is agreeable to treatment with anti-estrogens--we have started anastrozole--with the understanding chance of response is low. We discussed more aggressive options (abemaciclib, capecitabine) very generally and she and I both feel any treatment that would further compromise Amica's quality of Life is not going to be right for her.  Illianna will benefit from referral to [place] service (note there are not nurses at the [facility]); if they can visit Olegario Shearer every 1-2 weeks to let us know if there are new symptoms or obvious disease progrression that would be useful.  In summary suggest:  (1) Anastrozole 1 mg po daily (2) discharge to [facility] (3) Palliative Care/[facility] to follow as outpatient  I will plan to see her in about 5 weeks at the [facility].

## 2020-09-26 NOTE — Consult Note (Signed)
Consultation Note Date: 09/26/2020   Patient Name: Paula Cantu  DOB: [birth year]  MRN: [id number]  Age / Sex: 75 y.o., female  PCP: Trinidad Curet Referring Physician: Cherene Altes, MD  Reason for Consultation: Establishing goals of care  HPI/Patient Profile: 75 y.o. female  with past medical history of metastatic breast cancer, cognitive impairment, type 2 diabetes and who is long-term resident of group home admitted on 09/21/2020 with weakness and difficulty breathing.  She was noted to have dark and frequent urine but UA was negative.  She was found to be intermittently hypoxic and CTA ruled out PE but found pulmonary nodules, pleural effusion, and right breast mass.  She had thoracentesis and appears to be breathing more comfortably following this.  Palliative consulted to discuss goals of care.  Clinical Assessment and Goals of Care: Palliative care consult received.  Chart reviewed including personal review of pertinent labs and imaging.  I saw and examined Paula Cantu today.  She was awake and alert but is nonverbal at baseline.  She appears to be resting comfortably.  I called and was able to reach patient's cousin/HC POA, Katharine Look.  Katharine Look lives in [place] and tells me she is doing her best to help advocate for Paula Cantu but is difficult as she is out of town.  I introduced palliative care as specialized medical care for people living with serious illness. It focuses on providing relief from the symptoms and stress of a serious illness. The goal is to improve quality of life for both the patient and the family.  Katharine Look and I discussed clinical course as well as wishes moving forward in regard to advanced directives.  Concepts specific to code status and care plan this hospitalization discussed.  We discussed difference between a aggressive medical intervention path and a palliative, comfort focused  care path.  Values and goals of care important to patient and family were attempted to be elicited.  Katharine Look understands that Paula Cantu has incurable illness and wants to focus on her having the best quality of life possible.  At the same time, she wants to ensure that she has any reasonable medical interventions that are likely to add quality time to Paula Cantu's life.  We discussed her concern that Paula Cantu's mental status influences recommendations regarding her care.  We discussed that, in fact, this does change certain aspects and recommendations for care, such as initial plan for consideration for Pleurx catheter placement but reevaluating that this would not be in her best interest that she is likely to pull it out.  Katharine Look expressed being thankful for discussion and stated again she is wants to do what is best for Paula Cantu but also wants to make sure she is advocating on her behalf.  She had multiple questions regarding if this is a new cancer versus recurrence of prior cancer we discussed the fact that this is likely metastatic breast cancer rather than being a new lung primary.  We talked about malignant effusion and how this would  likely return at some point in time.  Discussed that if effusion recurred in short order, recommendation would be for comfort at that time.  She expressed needing to discuss further with Dr. Jana Hakim regarding breast mass, pulmonary nodules, effusion, and her questions regarding potential treatment.  I relayed my understanding of recommendation for consideration of starting anastrozole.  She again noted that she needs to discuss further with Dr. Jana Hakim.  Questions and concerns addressed.   PMT will continue to support holistically.   SUMMARY OF RECOMMENDATIONS   -Recommend limitation of care DNR/DNI. -Patient's cousin, Katharine Look, feels that she needs to know the results of cytology and to be able to speak further with Dr. Jana Hakim, who knows patient well,  prior to making any decisions about goals of care. -We will plan to follow-up tomorrow once Katharine Look has a chance to discuss further and clarify her questions with oncology and we hopefully have preliminary results of cytology available. Additional Recommendations: Caregiving  Support/Resources  Prognosis:  Guarded  Discharge Planning: To Be Determined      Primary Diagnoses: Present on Admission:  Pleural effusion   I have reviewed the medical record, interviewed the patient and family, and examined the patient. The following aspects are pertinent.  Past Medical History:  Diagnosis Date   Arthritis    Cancer ([place])    endometrial   Constipation    Diabetes mellitus without complication (HCC)    takes metformin   Frequent falls    Gait disorder    Incontinence of bowel    Incontinence of urine    Intellectual disability    severe intellectual disability, non-verbal   Osteopenia    Seizures ([place])    remote history; possible febrile childhood seizure   Social History   Socioeconomic History   Marital status: Single    Spouse name: Not on file   Number of children: 0   Years of education: Not on file   Highest education level: Not on file  Occupational History   Occupation: Disabled  Tobacco Use   Smoking status: Never   Smokeless tobacco: Never  Vaping Use   Vaping Use: Never used  Substance and Sexual Activity   Alcohol use: No   Drug use: No   Sexual activity: Not on file  Other Topics Concern   Not on file  Social History Narrative   Lives at [facility].   She is ambidextrous.   Occasional caffeine use.   Social Determinants of Health   Financial Resource Strain: Not on file  Food Insecurity: Not on file  Transportation Needs: Not on file  Physical Activity: Not on file  Stress: Not on file  Social Connections: Not on file   History reviewed. No pertinent family history. Scheduled Meds:  anastrozole  1 mg Oral Daily   vitamin C  500 mg Oral BID    cholecalciferol  4,000 Units Oral Daily   divalproex  250 mg Oral BID   omega-3 acid ethyl esters  1 g Oral Daily   senna-docusate  2 tablet Oral QHS   Continuous Infusions: PRN Meds:.acetaminophen **OR** acetaminophen, LORazepam, metoprolol tartrate, promethazine Medications Prior to Admission:  Prior to Admission medications   Medication Sig Start Date End Date Taking? Authorizing Provider  Cholecalciferol (VITAMIN D3) 50 MCG (2000 UT) capsule Take 4,000 Units by mouth daily.   Yes [provider]  divalproex (DEPAKOTE SPRINKLES) 125 MG capsule Take 2 capsules (250 mg total) by mouth 2 (two) times daily. 11/24/17  Yes Krista Blue,  Aliene Beams, MD  menthol-zinc oxide (GOLD BOND) powder Apply 1 application topically 2 (two) times daily. Apply under breasts   Yes [provider]  metFORMIN (GLUCOPHAGE) 850 MG tablet Take 850 mg by mouth daily with breakfast.   Yes [provider]  Omega-3 Fatty Acids (FISH OIL PO) Take 1,200 mg by mouth daily. Fish oil 1600mg / 70mL   --- W/ Food   Yes [provider]  promethazine (PHENERGAN) 25 MG suppository Place 25 mg rectally every 4 (four) hours as needed for vomiting.   Yes [provider]  promethazine (PHENERGAN) 25 MG tablet Take 25 mg by mouth every 4 (four) hours as needed for vomiting. Notify md if symptoms persist over 12 hours   Yes [provider]  sennosides-docusate sodium (SENOKOT-S) 8.6-50 MG tablet Take 2 tablets by mouth at bedtime.   Yes [provider]  tamoxifen (NOLVADEX) 20 MG tablet Take 20 mg by mouth daily.   Yes [provider]  vitamin C (ASCORBIC ACID) 500 MG tablet Take 500 mg by mouth 2 (two) times daily.   Yes [provider]   Allergies  Allergen Reactions   Lactose Intolerance (Gi) Other (See Comments)    On nursing home records   Bacillus Other (See Comments)    UNSPECIFIED REACTION    Ppd [Tuberculin Purified Protein Derivative] Other (See Comments)     UNSPECIFIED REACTION  On nursing home records   Review of Systems Unable to obtain  Physical Exam General: Alert, awake, in no acute distress.  Nonverbal. HEENT: No bruits, no goiter, no JVD Heart: Regular rate and rhythm. No murmur appreciated. Lungs: Decreased air movement, clear Abdomen: Soft, nontender, nondistended, positive bowel sounds.   Ext: No significant edema Skin: Warm and dry  Vital Signs: BP (!) 155/86 (BP Location: Right Arm)   Pulse (!) 135   Temp 98.9 F (37.2 C) (Axillary)   Resp 18   Ht 4' (1.219 m)   Wt 57.7 kg   SpO2 91%   BMI 38.85 kg/m  Pain Scale: Faces POSS *See Group Information*: S-Acceptable,Sleep, easy to arouse Pain Score: 0-No pain   SpO2: SpO2: 91 % O2 Device:SpO2: 91 % O2 Flow Rate: .O2 Flow Rate (L/min): 10 L/min  IO: Intake/output summary:  Intake/Output Summary (Last 24 hours) at 09/26/2020 1700 Last data filed at 09/25/2020 1843 Gross per 24 hour  Intake 280 ml  Output 250 ml  Net 30 ml    LBM:   Baseline Weight: Weight: 47.2 kg Most recent weight: Weight: 57.7 kg     Palliative Assessment/Data:   Flowsheet Rows    Flowsheet Row Most Recent Value  Intake Tab   Referral Department Hospitalist  Unit at Time of Referral Med/Surg Unit  Palliative Care Primary Diagnosis Cancer  Date Notified 09/22/20  Palliative Care Type New Palliative care  Reason for referral Clarify Goals of Care  Date of Admission 09/21/20  Date first seen by Palliative Care 09/25/20  # of days Palliative referral response time 3 Day(s)  # of days IP prior to Palliative referral 1  Clinical Assessment   Palliative Performance Scale Score 40%  Psychosocial & Spiritual Assessment   Palliative Care Outcomes   Patient/Family meeting held? Yes  Who was at the meeting? Cousin via phone       Time In: 1450 Time Out: 1505 Time Total: 75 Greater than 50%  of this time was spent counseling and coordinating care related to the above assessment and  plan.  Signed by: Micheline Rough, MD   Please contact Palliative Medicine Team phone at [phone] for questions and concerns.  For individual provider: See Shea Evans

## 2020-09-27 DIAGNOSIS — Z170 Estrogen receptor positive status [ER+]: Secondary | ICD-10-CM | POA: Diagnosis not present

## 2020-09-27 DIAGNOSIS — C50812 Malignant neoplasm of overlapping sites of left female breast: Secondary | ICD-10-CM | POA: Diagnosis not present

## 2020-09-27 DIAGNOSIS — J9601 Acute respiratory failure with hypoxia: Secondary | ICD-10-CM | POA: Diagnosis not present

## 2020-09-27 DIAGNOSIS — J90 Pleural effusion, not elsewhere classified: Secondary | ICD-10-CM | POA: Diagnosis not present

## 2020-09-27 LAB — CANCER ANTIGEN 27.29: CA 27.29: 102.7 U/mL — ABNORMAL HIGH (ref 0.0–38.6)

## 2020-09-27 LAB — GLUCOSE, CAPILLARY: Glucose-Capillary: 133 mg/dL — ABNORMAL HIGH (ref 70–99)

## 2020-09-27 MED ORDER — ANASTROZOLE 1 MG PO TABS: 1.0000 mg | ORAL_TABLET | Freq: Every day | ORAL | 1 refills | Status: DC

## 2020-09-27 MED ORDER — SENNA-DOCUSATE SODIUM 8.6-50 MG PO TABS: 2.0000 | ORAL_TABLET | Freq: Every day | ORAL | 0 refills | Status: AC

## 2020-09-27 MED ORDER — GOLD BOND EX POWD: 1.0000 "application " | Freq: Two times a day (BID) | CUTANEOUS | 0 refills | Status: AC

## 2020-09-27 MED ORDER — ACETAMINOPHEN 325 MG PO TABS: 650.0000 mg | ORAL_TABLET | Freq: Four times a day (QID) | ORAL | 1 refills | Status: AC | PRN

## 2020-09-27 MED ORDER — PROMETHAZINE HCL 25 MG RE SUPP: 25.0000 mg | RECTAL | 0 refills | Status: AC | PRN

## 2020-09-27 MED ORDER — ANASTROZOLE 1 MG PO TABS
1.0000 mg | ORAL_TABLET | Freq: Every day | ORAL | 1 refills | Status: AC
Start: 2020-09-28 — End: ?

## 2020-09-27 MED ORDER — PROMETHAZINE HCL 25 MG PO TABS: 25.0000 mg | ORAL_TABLET | ORAL | 0 refills | Status: AC | PRN

## 2020-09-27 MED ORDER — DIVALPROEX SODIUM 125 MG PO CSDR: 250.0000 mg | DELAYED_RELEASE_CAPSULE | Freq: Two times a day (BID) | ORAL | 11 refills | Status: AC

## 2020-09-27 MED ORDER — ACETAMINOPHEN 325 MG PO TABS: 650.0000 mg | ORAL_TABLET | Freq: Four times a day (QID) | ORAL | Status: DC | PRN

## 2020-09-27 NOTE — Progress Notes (Signed)
Group home personnel here to transport pt. RN clarified again with group home personnel if they knew pt was total care. They said this is her baseline and have always been taking care of her like this. IV taken out RN and nurse tech accompanied group home personnel to car with pt on a wheelchair. Pt was safely transferred to group home transport bus.

## 2020-09-27 NOTE — TOC Progression Note (Signed)
Transition of Care [facility]) - Progression Note    Patient Details  Name: Paula Cantu MRN: [id number] Date of Birth: Feb 20, 1946  Transition of Care [facility]) CM/SW Contact  Ross Ludwig, [place] Phone Number: 09/27/2020, 9:46 AM  Clinical Narrative:     CSW spoke to patient's legal guardian, she is requesting that patient return back to group home.  CSW received contact information for group home manager Neoma Laming [phone].  CSW to follow up with her to make sure patient can return.   Expected Discharge Plan: Group Home Barriers to Discharge: Continued Medical Work up  Expected Discharge Plan and Services Expected Discharge Plan: Group Home       Living arrangements for the past 2 months: Group Home Expected Discharge Date: 09/27/20                                     Social Determinants of Health (SDOH) Interventions    Readmission Risk Interventions No flowsheet data found.

## 2020-09-27 NOTE — Progress Notes (Signed)
On rounds  brought up concern about patient being total care and going to a group home instead of Skilled nursing facility. Informed by SW that the patient has always been total care and group home is aware.

## 2020-09-27 NOTE — Discharge Summary (Addendum)
DISCHARGE SUMMARY  Paula Cantu  MR#: [id number]  DOB:01-13-1946  Date of Admission: 09/21/2020 Date of Discharge: 09/27/2020  Attending Physician:Costa Jha Hennie Duos, MD  Patient's ATF:TDDUKG, [place]  Consults: Oncology  Palliative Care   Disposition: D/C to her Group Home with outpatient Palliative Care consult    Follow-up Appts:  Follow-up Information     [facility], [place] Follow up in 1 week(s).   Specialty: Family Medicine Contact information: [address] [place] [address] [phone]         [facility]. Schedule an appointment as soon as possible for a visit in 3 day(s).   Specialty: PALLIATIVE CARE Contact information: [place] [phone]        Magrinat, Virgie Dad, MD Follow up in 5 week(s).   Specialty: Oncology Contact information: [place] [address] [phone]                 Tests Needing Follow-up: -an outpatient Palliative Care consultation should be requested/arranged   Discharge Diagnoses: Large right malignant pleural effusion Acute hypoxic respiratory failure Stage IV breast cancer - brain mets - lung mets Sinus tachycardia Severe intellectual disability -nonverbal baseline DM2 NO CODE BLUE - DNR   Initial presentation: 74yo with a history of metastatic breast cancer, cognitive impairment, and DM2 who presented to the ED with increased urinary frequency.  Patient is nonverbal at baseline.  There was a report of several days of malodorous dark frequent urine, and the group home where she lives was needing to provide much more assistance than usual.  She was transported to the ED where a UA was negative.  She was noted to be intermittently hypoxic.  CTa chest revealed no evidence of pulmonary embolism but did note pulmonary nodules and a significant R pleural effusion along with a right breast mass.  She underwent  thoracentesis  Hospital Course:  Large right malignant pleural effusion - acute hypoxic respiratory failure Status post thoracentesis with 900 cc of blood-tinged fluid drained -patient was combative and therefore pigtail chest tube was not able to be placed - symptomatically improved markedly following the procedure - saturations stable on room air - cytology from fluid consistent with metastatic adenocarcinoma   Stage IV breast cancer - brain mets - lung mets Oncology folllowed during hospital stay, and carried out frequent lengthy discussion w/ her POA in order to formulate a tx plan  - CT head 7/11 confirmed at least 4 enhancing brain lesions - further aggressive therapy not felt to be appropriate or beneficial - anastrozole initiated - discussion as per Oncology was as follows:  Discussed with HCPOA/legal guardian Paula Cantu. She feels what Paula Cantu really wants is to [facility] to the [facility] Paula Cantu is agreeable to treatment with anti-estrogens--we have started anastrozole--with the understanding chance of response is low. We discussed more aggressive options (abemaciclib, capecitabine) very generally and she and I both feel any treatment that would further compromise Paula Cantu's quality of Life is not going to be right for her. Paula Cantu will benefit from referral to [place] service; if they can visit Paula Cantu every 1-2 weeks to let us know if there are new symptoms or obvious disease progrression that would be useful.   Sinus tachycardia A physiologic response to anxiety and the above acute medical issues - stable and requiring no treatment    Severe intellectual disability -nonverbal baseline Lives in a group home   DM2 No indication for strict  CBG control -monitor to avoid extremes   Goals of care Cytology of pleural fluid confirms metastatic breast cancer - primary focus is to be on comfort - outpatient Palliative Care Services will need to be arranged at her  [place] as of 09/27/2020       Reactions   Lactose Intolerance (gi) Other (See Comments)   On nursing home records   Bacillus Other (See Comments)   UNSPECIFIED REACTION    Ppd [tuberculin Purified Protein Derivative] Other (See Comments)   UNSPECIFIED REACTION  On nursing home records        Medication List     STOP taking these medications    FISH OIL PO   metFORMIN 850 MG tablet Commonly known as: GLUCOPHAGE   tamoxifen 20 MG tablet Commonly known as: NOLVADEX   vitamin C 500 MG tablet Commonly known as: ASCORBIC ACID   Vitamin D3 50 MCG (2000 UT) capsule       TAKE these medications    acetaminophen 325 MG tablet Commonly known as: TYLENOL Take 2 tablets (650 mg total) by mouth every 6 (six) hours as needed for mild pain (or Fever >/= 101).   anastrozole 1 MG tablet Commonly known as: ARIMIDEX Take 1 tablet (1 mg total) by mouth daily. Start taking on: September 28, 2020   divalproex 125 MG capsule Commonly known as: Depakote Sprinkles Take 2 capsules (250 mg total) by mouth 2 (two) times daily.   menthol-zinc oxide powder Apply 1 application topically 2 (two) times daily. Apply under breasts   promethazine 25 MG tablet Commonly known as: PHENERGAN Take 25 mg by mouth every 4 (four) hours as needed for vomiting. Notify md if symptoms persist over 12 hours   promethazine 25 MG suppository Commonly known as: PHENERGAN Place 25 mg rectally every 4 (four) hours as needed for vomiting.   sennosides-docusate sodium 8.6-50 MG tablet Commonly known as: SENOKOT-S Take 2 tablets by mouth at bedtime.        Day of Discharge BP (!) 132/97 (BP Location: Left Arm)   Pulse (!) 128   Temp 98.4 F (36.9 C) (Oral)   Resp 18   Ht 4' (1.219 m)   Wt 57.7 kg   SpO2 (!) 87%   BMI 38.85 kg/m   Physical Exam: General: No acute respiratory distress Lungs: Clear to auscultation bilaterally without wheezes or crackles Cardiovascular: Regular rate  and rhythm without murmur gallop or rub normal S1 and S2 Abdomen: Nontender, nondistended, soft, bowel sounds positive, no rebound, no ascites, no appreciable mass Extremities: No significant cyanosis, clubbing, or edema bilateral lower extremities  Basic Metabolic Panel: Recent Labs  Lab 09/21/20 1622 09/22/20 1008 09/23/20 0317 09/25/20 0334  NA 139 142 143 142  K 3.8 3.9 3.9 4.0  CL 110 110 109 111  CO2 23 25 29 23   GLUCOSE 112* 100* 100* 114*  BUN 11 8 7* 12  CREATININE 0.59 0.53 0.58 0.50  CALCIUM 8.1* 8.1* 8.1* 8.2*  MG  --  2.0  --  2.2  PHOS  --   --   --  4.4    Liver Function Tests: Recent Labs  Lab 09/21/20 1622 09/22/20 1008 09/23/20 0317  AST 17 21 25   ALT 12 16 20   ALKPHOS 33* 36* 35*  BILITOT 0.5 0.5 0.5  PROT 5.7* 5.8* 5.4*  ALBUMIN 3.0* 3.0* 2.8*   Recent Labs  Lab 09/21/20 1622  LIPASE 32    CBC: Recent Labs  Lab 09/21/20 1622 09/22/20 1008 09/23/20 0317 09/25/20 0334  WBC 10.4 9.4 8.1 12.0*  NEUTROABS 8.1* 5.8  --   --   HGB 11.8* 12.5 12.1 12.9  HCT 36.7 39.7 37.8 40.2  MCV 94.8 95.9 94.5 93.9  PLT 217 216 201 238    CBG: Recent Labs  Lab 09/21/20 1634 09/27/20 0655  GLUCAP 114* 133*    Recent Results (from the past 240 hour(s))  Blood culture (routine x 2)     Status: None   Collection Time: 09/21/20  4:22 PM   Specimen: BLOOD  Result Value Ref Range Status   Specimen Description   Final    BLOOD RIGHT ANTECUBITAL Performed at [facility], [place] [address], [place], [place] [address]    Special Requests   Final    BOTTLES DRAWN AEROBIC AND ANAEROBIC Blood Culture adequate volume Performed at [place] [address], [place], [place] [address]    Culture   Final    NO GROWTH 5 DAYS Performed at [facility], [place] [address] [place], [place] [address]    Report Status 09/26/2020 FINAL  Final  Blood culture (routine x 2)     Status: None   Collection Time: 09/21/20   4:27 PM   Specimen: BLOOD  Result Value Ref Range Status   Specimen Description   Final    BLOOD LEFT ANTECUBITAL Performed at [place] [address], [place], [place] [address]    Special Requests   Final    BOTTLES DRAWN AEROBIC AND ANAEROBIC Blood Culture adequate volume Performed at [place] [address], [place],  [address]    Culture   Final    NO GROWTH 5 DAYS Performed at [facility], [place] [address] [place],  [address]    Report Status 09/26/2020 FINAL  Final  Resp Panel by RT-PCR (Flu A&B, Covid) Nasopharyngeal Swab     Status: None   Collection Time: 09/21/20  7:04 PM   Specimen: Nasopharyngeal Swab; Nasopharyngeal(NP) swabs in vial transport medium  Result Value Ref Range Status   SARS Coronavirus 2 by RT PCR NEGATIVE NEGATIVE Final    Comment: (NOTE) SARS-CoV-2 target nucleic acids are NOT DETECTED.  The SARS-CoV-2 RNA is generally detectable in upper respiratory specimens during the acute phase of infection. The lowest concentration of SARS-CoV-2 viral copies this assay can detect is 138 copies/mL. A negative result does not preclude SARS-Cov-2 infection and should not be used as the sole basis for treatment or other patient management decisions. A negative result may occur with  improper specimen collection/handling, submission of specimen other than nasopharyngeal swab, presence of viral mutation(s) within the areas targeted by this assay, and inadequate number of viral copies(<138 copies/mL). A negative result must be combined with clinical observations, patient history, and epidemiological information. The expected result is Negative.  Fact Sheet for Patients:  EntrepreneurPulse.com.au  Fact Sheet for Healthcare Providers:  IncredibleEmployment.be  This test is no t yet approved or cleared by the [place] FDA and  has been authorized for  detection and/or diagnosis of SARS-CoV-2 by FDA under an Emergency Use Authorization (EUA). This EUA will remain  in effect (meaning this test can be used) for the duration of the COVID-19 declaration under Section 564(b)(1) of the Act, 21 U.S.C.section 360bbb-3(b)(1), unless the authorization is terminated  or revoked sooner.       Influenza A by PCR NEGATIVE NEGATIVE Final  Influenza B by PCR NEGATIVE NEGATIVE Final    Comment: (NOTE) The Xpert Xpress SARS-CoV-2/FLU/RSV plus assay is intended as an aid in the diagnosis of influenza from Nasopharyngeal swab specimens and should not be used as a sole basis for treatment. Nasal washings and aspirates are unacceptable for Xpert Xpress SARS-CoV-2/FLU/RSV testing.  Fact Sheet for Patients: EntrepreneurPulse.com.au  Fact Sheet for Healthcare Providers: IncredibleEmployment.be  This test is not yet approved or cleared by the [place] FDA and has been authorized for detection and/or diagnosis of SARS-CoV-2 by FDA under an Emergency Use Authorization (EUA). This EUA will remain in effect (meaning this test can be used) for the duration of the COVID-19 declaration under Section 564(b)(1) of the Act, 21 U.S.C. section 360bbb-3(b)(1), unless the authorization is terminated or revoked.  Performed at [facility], [place] [address], [place], [place] [address]   Urine culture     Status: Abnormal   Collection Time: 09/21/20  7:37 PM   Specimen: Urine, Random  Result Value Ref Range Status   Specimen Description   Final    URINE, RANDOM Performed at [place] [address], [place], [place] [address]    Special Requests   Final    NONE Performed at [facility], [place] [address] [place], [place] [address]    Culture 10,000 COLONIES/mL ESCHERICHIA COLI (A)  Final   Report Status 09/24/2020 FINAL  Final   Organism ID, Bacteria  ESCHERICHIA COLI (A)  Final      Susceptibility   Escherichia coli - MIC*    AMPICILLIN 4 SENSITIVE Sensitive     CEFAZOLIN <=4 SENSITIVE Sensitive     CEFEPIME <=0.12 SENSITIVE Sensitive     CEFTRIAXONE <=0.25 SENSITIVE Sensitive     CIPROFLOXACIN >=4 RESISTANT Resistant     GENTAMICIN <=1 SENSITIVE Sensitive     IMIPENEM <=0.25 SENSITIVE Sensitive     NITROFURANTOIN <=16 SENSITIVE Sensitive     TRIMETH/SULFA <=20 SENSITIVE Sensitive     AMPICILLIN/SULBACTAM <=2 SENSITIVE Sensitive     PIP/TAZO <=4 SENSITIVE Sensitive     * 10,000 COLONIES/mL ESCHERICHIA COLI      Time spent in discharge (includes decision making & examination of pt): 35 minutes  09/27/2020, 2:07 PM   Cherene Altes, MD [facility] Office  [phone]

## 2020-09-27 NOTE — TOC Progression Note (Addendum)
Transition of Care [facility]) - Progression Note    Patient Details  Name: Paula Cantu MRN: [id number] Date of Birth: April 18, 1945  Transition of Care [facility]) CM/SW Contact  Ross Ludwig, [place] Phone Number: 09/27/2020, 9:58 AM  Clinical Narrative:     CSW attempted to contact group home manager, Neoma Laming [phone], awaiting for a call back.  12:15pm  CSW spoke to Perry at group home.  She said they do not need PT to see her before she leaves, they have a PT seeing her at the group home.  Her baseline is full assistance from staff.  Per Neoma Laming, they just need the DC summary faxed to [phone].  CSW to continue to follow patient's progress throughout discharge planning.  2:15pm  CSW spoke to [facility] on call nurse [phone] she is reviewing patient's information to make sure she can return today.  She will call CSW back once they reviewed patient's information.  Referral made for outpatient palliative through [facility] per home manager Deborah's request.  CSW awaiting for call back.   Expected Discharge Plan: Group Home Barriers to Discharge: Continued Medical Work up  Expected Discharge Plan and Services Expected Discharge Plan: Group Home       Living arrangements for the past 2 months: Group Home Expected Discharge Date: 09/27/20                                     Social Determinants of Health (SDOH) Interventions    Readmission Risk Interventions No flowsheet data found.

## 2020-09-27 NOTE — TOC Transition Note (Addendum)
Transition of Care [facility]) - CM/SW Discharge Note   Patient Details  Name: RYLEIGH ESQUEDA MRN: [id number] Date of Birth: October 17, 1945  Transition of Care [facility]) CM/SW Contact:  Ross Ludwig, LCSW Phone Number: 09/27/2020, 2:23 PM   Clinical Narrative:     CSW spoke to group home [facility] manager Neoma Laming [phone], and nurse on call Sharen Hones [phone] to discuss patient returning today.  They said once they review the discharge paperwork, they should be able to pick patient up from hospital.    Tamika said she will need nurse to call report to her at [phone] once patient is ready for discharge.  CSW faxed prescriptions to [facility] [phone].  CSW updated bedside nurse and attending physician.  CSW spoke to [place] and they have accepted referral for outpatient palliative to follow.    Final next level of care: Group Home Barriers to Discharge: Barriers Resolved   Patient Goals and CMS Choice Patient states their goals for this hospitalization and ongoing recovery are:: To return back to [facility] group home. CMS Medicare.gov Compare Post Acute Care list provided to:: Patient Represenative (must comment) Choice offered to / list presented to :  (Group Home manager.)  Discharge Placement  Return to [facility] group home.                     Discharge Plan and Services                                     Social Determinants of Health (SDOH) Interventions     Readmission Risk Interventions No flowsheet data found.

## 2020-09-27 NOTE — Progress Notes (Signed)
Gave report to the group home RN getting report made aware that pt was total care.

## 2020-09-28 LAB — CYTOLOGY - NON PAP

## 2020-10-01 ENCOUNTER — Other Ambulatory Visit: Payer: Medicare Other

## 2020-10-01 ENCOUNTER — Telehealth: Payer: Self-pay | Admitting: Oncology

## 2020-10-01 NOTE — Telephone Encounter (Signed)
Scheduled appointment per 07/13 sch msg. Nurse is aware.

## 2020-10-02 ENCOUNTER — Telehealth: Payer: Self-pay | Admitting: *Deleted

## 2020-10-02 NOTE — Telephone Encounter (Signed)
This RN received VM from Roslyn at [facility] stating order per attending for " need to be seen before scheduled f/u for molecular studies to determine treatment plan options due to new metastatic disease "  This RN informed Tamika- per recent inpt stay- Dr Jana Hakim was consulted and discussed treatment options with pt's HCPOA with goal for best control with antiestrogen therapy with least side effects for palliative care with referral to Palliative Care with [facility].  This RN will fax hospital dictation stating above for their records - with given fax number of [phone].  This RN then received call from [facility] wanting to verify referral for Palliative Care per recent discharge. This RN verified referral. [facility] has accessed to hospital dictations and can see Dr Magrinat's plan of care per 09/26/2020 dictation.

## 2020-10-04 ENCOUNTER — Emergency Department (HOSPITAL_COMMUNITY): Payer: Medicare Other

## 2020-10-04 ENCOUNTER — Encounter (HOSPITAL_COMMUNITY): Payer: Self-pay | Admitting: Emergency Medicine

## 2020-10-04 ENCOUNTER — Emergency Department (HOSPITAL_COMMUNITY)
Admission: EM | Admit: 2020-10-04 | Discharge: 2020-10-04 | Disposition: A | Discharge status: Home / Self Care | Payer: Medicare Other | Attending: Emergency Medicine | Admitting: Emergency Medicine

## 2020-10-04 DIAGNOSIS — R062 Wheezing: Secondary | ICD-10-CM | POA: Insufficient documentation

## 2020-10-04 DIAGNOSIS — Z85118 Personal history of other malignant neoplasm of bronchus and lung: Secondary | ICD-10-CM | POA: Insufficient documentation

## 2020-10-04 DIAGNOSIS — Z79899 Other long term (current) drug therapy: Secondary | ICD-10-CM | POA: Insufficient documentation

## 2020-10-04 DIAGNOSIS — Z853 Personal history of malignant neoplasm of breast: Secondary | ICD-10-CM | POA: Insufficient documentation

## 2020-10-04 DIAGNOSIS — Z8542 Personal history of malignant neoplasm of other parts of uterus: Secondary | ICD-10-CM | POA: Diagnosis not present

## 2020-10-04 DIAGNOSIS — Z8616 Personal history of COVID-19: Secondary | ICD-10-CM | POA: Diagnosis not present

## 2020-10-04 DIAGNOSIS — E119 Type 2 diabetes mellitus without complications: Secondary | ICD-10-CM | POA: Insufficient documentation

## 2020-10-04 NOTE — ED Triage Notes (Addendum)
Per EMS-from [facility] group home-recently diagnosed with metastatic lung cancer-chronic left lower lobe wheezing-sent to ED due to it being worse this am-per EMS-ran this patient a few days ago, states wheezing consistent, no s/s's of respiratory distress-patient does not care for health care workers and is noncompliant with VS, especially pulse ox-patient is nonverbal

## 2020-10-04 NOTE — ED Notes (Signed)
Per group home, someone will be coming to pick her up

## 2020-10-04 NOTE — TOC Progression Note (Signed)
Transition of Care [facility]) - Progression Note    Patient Details  Name: Paula Cantu MRN: [id number] Date of Birth: [birth year]  Transition of Care [facility]) CM/SW Contact  Leeroy Cha, RN Phone Number: 10/04/2020, 1:33 PM  Clinical Narrative:    Victorino Dike with [facility].  Will have someone come and see patient in the ed.        Expected Discharge Plan and Services                                                 Social Determinants of Health (SDOH) Interventions    Readmission Risk Interventions No flowsheet data found.

## 2020-10-04 NOTE — Progress Notes (Signed)
Palliative Medicine RN Note: Note that patient has a consult for hospice care, but pt is also already discharged.   If pt/family want hospice, please consult TOC to offer choice & set up hospice.   Marjie Skiff Tanay Misuraca, RN, BSN, [facility] Palliative Medicine Team 10/04/2020 12:10 PM Office [phone]

## 2020-10-04 NOTE — Discharge Instructions (Addendum)
Continue outpatient follow-up with the palliative care team.  Return back to the ER if you have fevers worsening symptoms difficulty breathing or any additional concerns.

## 2020-10-04 NOTE — Progress Notes (Addendum)
Manufacturing engineer [facility]) Hospital Liaison: RN note      Addendum:  Per review of notes this pt was referred by Dr. Jana Hakim in consultaion with her HCPOA for palliative service with [facility]. [facility] will defer to their recommendations.   Notified by Transition of Care Manger of patient/family request for [facility] services at home after discharge. Chart and patient information under review by [facility] physician. Hospice eligibility pending currently.     At time of notification pt had already discharge from [facility] ED.  [facility] will follow up with this referral as an outpt referral.  Please do not hesitate to call with questions.   Thank you for the opportunity to participate in this patient's care.  Domenic Moras, BSN, RN       [place] (listed on AMION under [phone]  [phone] (24h on call)

## 2020-10-04 NOTE — ED Provider Notes (Signed)
[facility] DEPT Provider Note   CSN: [id number] Arrival date & time: 10/04/20  9381     History Chief Complaint  Patient presents with   Wheezing    Paula Cantu is a 75 y.o. female.  Unable to gain history from the patient herself as she has significant mental delay and unable to provide history review of systems.  Return from her facility due to concerns of wheezing.  Review of prior work-up indicates that she has a history of metastatic lung cancer with pleurocentesis recently, discharged home recently to pursue palliative care.         Past Medical History:  Diagnosis Date   Arthritis    Cancer ([place])    endometrial   Constipation    Diabetes mellitus without complication ([place])    takes metformin   Frequent falls    Gait disorder    Incontinence of bowel    Incontinence of urine    Intellectual disability    severe intellectual disability, non-verbal   Osteopenia    Seizures ([place])    remote history; possible febrile childhood seizure    Patient Active Problem List   Diagnosis Date Noted   Pneumonia due to COVID-19 virus 03/15/2020   Acute hypoxemic respiratory failure due to COVID-19 ([place]) 03/14/2020   Sinus tachycardia 03/14/2020   Acute encephalopathy 03/14/2020   Malignant neoplasm of overlapping sites of left breast in female, estrogen receptor positive ([place]) 11/09/2018   Mental disability 11/24/2017   Falling 08/12/2017   Idiopathic intellectual disability 04/13/2013    Past Surgical History:  Procedure Laterality Date   ABDOMINAL HYSTERECTOMY     BREAST LUMPECTOMY Left 03/04/2013   Procedure: LEFT BREAST LUMPECTOMY;  Surgeon: [name], MD;  Location: [facility];  Service: General;  Laterality: Left;   RADIOLOGY WITH ANESTHESIA N/A 09/16/2016   Procedure: RADIOLOGY WITH ANESTHESIA MRI OF THE BRAIN WITH AND WITHOUT;  Surgeon: Radiologist, Medication, MD;  Location: [facility];  Service: Radiology;  Laterality: N/A;    RADIOLOGY WITH ANESTHESIA N/A 09/15/2017   Procedure: MRI WITH ANESTHESIA OF THE BRAIN WITH AND WITHOUT AND CERVICAL WITHOUT CONTRAST;  Surgeon: Radiologist, Medication, MD;  Location: [facility];  Service: Radiology;  Laterality: N/A;     OB History   No obstetric history on file.     No family history on file.  Social History   Tobacco Use   Smoking status: Never   Smokeless tobacco: Never  Vaping Use   Vaping Use: Never used  Substance Use Topics   Alcohol use: No   Drug use: No    Home Medications Prior to Admission medications   Medication Sig Start Date End Date Taking? Authorizing Provider  acetaminophen (TYLENOL) 325 MG tablet Take 2 tablets (650 mg total) by mouth every 6 (six) hours as needed for mild pain (or Fever >/= 101). 09/27/20   Cherene Altes, MD  anastrozole (ARIMIDEX) 1 MG tablet Take 1 tablet (1 mg total) by mouth daily. 09/28/20   Cherene Altes, MD  divalproex (DEPAKOTE SPRINKLES) 125 MG capsule Take 2 capsules (250 mg total) by mouth 2 (two) times daily. 09/27/20   Cherene Altes, MD  menthol-zinc oxide (GOLD BOND) powder Apply 1 application topically 2 (two) times daily. Apply under breasts 09/27/20   Cherene Altes, MD  promethazine (PHENERGAN) 25 MG suppository Place 1 suppository (25 mg total) rectally every 4 (four) hours as needed for vomiting. 09/27/20   Cherene Altes, MD  promethazine (PHENERGAN) 25 MG tablet Take 1 tablet (25 mg total) by mouth every 4 (four) hours as needed for vomiting. Notify md if symptoms persist over 12 hours 09/27/20   Cherene Altes, MD  sennosides-docusate sodium (SENOKOT-S) 8.6-50 MG tablet Take 2 tablets by mouth at bedtime. 09/27/20   Cherene Altes, MD    Allergies    Lactose intolerance (gi), Bacillus, and Ppd [tuberculin purified protein derivative]  Review of Systems   Review of Systems  Unable to perform ROS: Dementia   Physical Exam Updated Vital Signs BP (!) 157/103 (BP Location: Right  Arm)   Pulse (!) 128   Temp 98.9 F (37.2 C) (Axillary)   Resp (!) 24   SpO2 93%   Physical Exam  ED Results / Procedures / Treatments   Labs (all labs ordered are listed, but only abnormal results are displayed) Labs Reviewed - No data to display  EKG None  Radiology DG Chest [facility] 1 View  Result Date: 10/04/2020 CLINICAL DATA:  Shortness of breath and cough. Image guided thoracentesis on 09/24/2020. EXAM: PORTABLE CHEST 1 VIEW COMPARISON:  09/21/2020 FINDINGS: Again noted are densities in the right lower chest compatible with volume loss and probably pleural fluid. Negative for pneumothorax. Left lung is clear. Heart and mediastinum are stable and within normal limits. IMPRESSION: Residual volume loss and densities in the right lower chest. Findings are most likely related to right pleural effusion and atelectasis. Overall, the aeration in the right lung is better than it was on 09/21/2020, prior to the thoracentesis. Electronically Signed   By: Markus Daft M.D.   On: 10/04/2020 11:00    Procedures Procedures   Medications Ordered in ED Medications - No data to display  ED Course  I have reviewed the triage vital signs and the nursing notes.  Pertinent labs & imaging results that were available during my care of the patient were reviewed by me and considered in my medical decision making (see chart for details).    MDM Rules/Calculators/A&P                           Patient is palliative care patient with unchanged vitals.  She remains moderately tachycardic and appears she was just where her prior admission as well.  She has a history of lung cancer with diffuse metastases.  I did repeat imaging of her chest today showing some reaccumulation of right-sided pleural effusion but appears improved from prior imaging.  Vital signs otherwise show O2 saturation 95% on room air which is appropriate.  Will advise continued outpatient palliative care.  Advised immediate return if she  has any additional concerns or fevers or worsening symptoms.  Final Clinical Impression(s) / ED Diagnoses Final diagnoses:  None    Rx / DC Orders ED Discharge Orders     None        Luna Fuse, MD 10/04/20 1147

## 2020-10-05 ENCOUNTER — Emergency Department (HOSPITAL_COMMUNITY): Payer: Medicare Other

## 2020-10-05 ENCOUNTER — Emergency Department (HOSPITAL_COMMUNITY)
Admission: EM | Admit: 2020-10-05 | Discharge: 2020-10-05 | Disposition: A | Discharge status: Hospice | Payer: Medicare Other | Attending: Emergency Medicine | Admitting: Emergency Medicine

## 2020-10-05 DIAGNOSIS — Z8542 Personal history of malignant neoplasm of other parts of uterus: Secondary | ICD-10-CM | POA: Insufficient documentation

## 2020-10-05 DIAGNOSIS — J90 Pleural effusion, not elsewhere classified: Secondary | ICD-10-CM | POA: Diagnosis not present

## 2020-10-05 DIAGNOSIS — R0602 Shortness of breath: Secondary | ICD-10-CM | POA: Diagnosis present

## 2020-10-05 DIAGNOSIS — Z7189 Other specified counseling: Secondary | ICD-10-CM

## 2020-10-05 DIAGNOSIS — R0600 Dyspnea, unspecified: Secondary | ICD-10-CM | POA: Diagnosis not present

## 2020-10-05 DIAGNOSIS — Z853 Personal history of malignant neoplasm of breast: Secondary | ICD-10-CM | POA: Insufficient documentation

## 2020-10-05 DIAGNOSIS — Z8616 Personal history of COVID-19: Secondary | ICD-10-CM | POA: Insufficient documentation

## 2020-10-05 DIAGNOSIS — E119 Type 2 diabetes mellitus without complications: Secondary | ICD-10-CM | POA: Insufficient documentation

## 2020-10-05 DIAGNOSIS — C50919 Malignant neoplasm of unspecified site of unspecified female breast: Secondary | ICD-10-CM | POA: Diagnosis not present

## 2020-10-05 DIAGNOSIS — C7931 Secondary malignant neoplasm of brain: Secondary | ICD-10-CM

## 2020-10-05 DIAGNOSIS — Z515 Encounter for palliative care: Secondary | ICD-10-CM | POA: Diagnosis not present

## 2020-10-05 DIAGNOSIS — Z20822 Contact with and (suspected) exposure to covid-19: Secondary | ICD-10-CM | POA: Diagnosis not present

## 2020-10-05 LAB — COMPREHENSIVE METABOLIC PANEL
ALT: 18 U/L (ref 0–44)
AST: 28 U/L (ref 15–41)
Albumin: 2.4 g/dL — ABNORMAL LOW (ref 3.5–5.0)
Alkaline Phosphatase: 31 U/L — ABNORMAL LOW (ref 38–126)
Anion gap: 8 (ref 5–15)
BUN: 10 mg/dL (ref 8–23)
CO2: 23 mmol/L (ref 22–32)
Calcium: 7.9 mg/dL — ABNORMAL LOW (ref 8.9–10.3)
Chloride: 110 mmol/L (ref 98–111)
Creatinine, Ser: 0.72 mg/dL (ref 0.44–1.00)
GFR, Estimated: >60 mL/min (ref >60)
Glucose, Bld: 164 mg/dL — ABNORMAL HIGH (ref 70–99)
Potassium: 4.7 mmol/L (ref 3.5–5.1)
Sodium: 141 mmol/L (ref 135–145)
Total Bilirubin: 1.2 mg/dL (ref 0.3–1.2)
Total Protein: 5.1 g/dL — ABNORMAL LOW (ref 6.5–8.1)

## 2020-10-05 LAB — CBC WITH DIFFERENTIAL/PLATELET
Abs Immature Granulocytes: 0.09 10*3/uL — ABNORMAL HIGH (ref 0.00–0.07)
Basophils Absolute: 0.1 10*3/uL (ref 0.0–0.1)
Basophils Relative: 1 %
Eosinophils Absolute: 0.3 10*3/uL (ref 0.0–0.5)
Eosinophils Relative: 3 %
HCT: 38.3 % (ref 36.0–46.0)
Hemoglobin: 12 g/dL (ref 12.0–15.0)
Immature Granulocytes: 1 %
Lymphocytes Relative: 22 %
Lymphs Abs: 2.2 10*3/uL (ref 0.7–4.0)
MCH: 30.5 pg (ref 26.0–34.0)
MCHC: 31.3 g/dL (ref 30.0–36.0)
MCV: 97.2 fL (ref 80.0–100.0)
Monocytes Absolute: 0.8 10*3/uL (ref 0.1–1.0)
Monocytes Relative: 7 %
Neutro Abs: 6.9 10*3/uL (ref 1.7–7.7)
Neutrophils Relative %: 66 %
Platelets: 340 10*3/uL (ref 150–400)
RBC: 3.94 MIL/uL (ref 3.87–5.11)
RDW: 13.6 % (ref 11.5–15.5)
WBC: 10.4 10*3/uL (ref 4.0–10.5)
nRBC: 0 % (ref 0.0–0.2)

## 2020-10-05 LAB — I-STAT CHEM 8, ED
BUN: 11 mg/dL (ref 8–23)
Calcium, Ion: 1.02 mmol/L — ABNORMAL LOW (ref 1.15–1.40)
Chloride: 110 mmol/L (ref 98–111)
Creatinine, Ser: 0.6 mg/dL (ref 0.44–1.00)
Glucose, Bld: 160 mg/dL — ABNORMAL HIGH (ref 70–99)
HCT: 35 % — ABNORMAL LOW (ref 36.0–46.0)
Hemoglobin: 11.9 g/dL — ABNORMAL LOW (ref 12.0–15.0)
Potassium: 4.6 mmol/L (ref 3.5–5.1)
Sodium: 142 mmol/L (ref 135–145)
TCO2: 26 mmol/L (ref 22–32)

## 2020-10-05 LAB — I-STAT ARTERIAL BLOOD GAS, ED
Acid-Base Excess: 0 mmol/L (ref 0.0–2.0)
Bicarbonate: 24.9 mmol/L (ref 20.0–28.0)
Calcium, Ion: 1.19 mmol/L (ref 1.15–1.40)
HCT: 33 % — ABNORMAL LOW (ref 36.0–46.0)
Hemoglobin: 11.2 g/dL — ABNORMAL LOW (ref 12.0–15.0)
O2 Saturation: 91 %
Potassium: 3.8 mmol/L (ref 3.5–5.1)
Sodium: 143 mmol/L (ref 135–145)
TCO2: 26 mmol/L (ref 22–32)
pCO2 arterial: 38.8 mmHg (ref 32.0–48.0)
pH, Arterial: 7.416 (ref 7.350–7.450)
pO2, Arterial: 59 mmHg — ABNORMAL LOW (ref 83.0–108.0)

## 2020-10-05 LAB — RESP PANEL BY RT-PCR (FLU A&B, COVID) ARPGX2
Influenza A by PCR: NEGATIVE
Influenza B by PCR: NEGATIVE
SARS Coronavirus 2 by RT PCR: NEGATIVE

## 2020-10-05 MED ORDER — METHYLPREDNISOLONE SODIUM SUCC 125 MG IJ SOLR
125.0000 mg | Freq: Once | INTRAMUSCULAR | Status: AC
  Administered 2020-10-05: 125 mg via INTRAVENOUS
  Filled 2020-10-05: qty 2

## 2020-10-05 MED ORDER — MORPHINE SULFATE (PF) 2 MG/ML IV SOLN
1.0000 mg | INTRAVENOUS | Status: DC | PRN
  Administered 2020-10-05: 2 mg via INTRAVENOUS
  Filled 2020-10-05: qty 1

## 2020-10-05 MED ORDER — LORAZEPAM 2 MG/ML IJ SOLN
0.5000 mg | Freq: Once | INTRAMUSCULAR | Status: AC
  Administered 2020-10-05: 0.5 mg via INTRAMUSCULAR
  Filled 2020-10-05: qty 1

## 2020-10-05 MED ORDER — IPRATROPIUM-ALBUTEROL 0.5-2.5 (3) MG/3ML IN SOLN
RESPIRATORY_TRACT | Status: AC
  Filled 2020-10-05: qty 3

## 2020-10-05 MED ORDER — IPRATROPIUM-ALBUTEROL 0.5-2.5 (3) MG/3ML IN SOLN: 3.0000 mL | Freq: Once | RESPIRATORY_TRACT | Status: DC

## 2020-10-05 NOTE — ED Notes (Signed)
Pt SpO2 89% on room air. Pt placed on 2L via [place]. SpO2 94% on 2L.

## 2020-10-05 NOTE — ED Notes (Signed)
Pt discharged from ED to [facility] via [facility].

## 2020-10-05 NOTE — Consult Note (Signed)
Consultation Note Date: 10/05/2020   Patient Name: Paula Cantu  DOB: [birth year]  MRN: [id number]  Age / Sex: 75 y.o., female  PCP: [facility] [place] Referring Physician: Lajean Saver, MD  Reason for Consultation: Establishing goals of care, "evaluate for hospice bed placement"  HPI/Patient Profile: 75 y.o. female  with past medical history of metastatic breast cancer, cognitive impairment, and type 2 diabetes who is a long-term resident of a group home. She was recently hospitalized 7/8 through 7/14 at [facility] and was found to have pulmonary nodules, pleural effusion, and right breast mass. She underwent thoracentesis on 7/11 with removal of 900 ml pleural fluid. She presents to the [facility] emergency department on 10/05/2020 with wheezing and shortness of breath.   Clinical Assessment and Goals of Care: I have reviewed medical records and examined the patient at bedside. She is tachycardic, tachypneic, and with oxygen saturation 87% on room air. She is unresponsive to voice and light touch.    I spoke with her cousin Wendelyn Breslow to discuss diagnosis, prognosis, GOC, EOL wishes, disposition, and options. Katharine Look tells me she lives in [place], [place] but does her best to advocate for Ms. Chlebowski.   Patient is known to PMT from her admission earlier this month. I re-introduced Palliative Medicine as specialized medical care for people living with serious illness. It focuses on providing relief from the symptoms and stress of a serious illness.   As far as functional status, patient is non-verbal at baseline. She has had functional decline over the past days and weeks. Caregiver from the group home reports patient has been non-ambulatory since she was discharged from her last hospitalization.   We discussed her current illness and what it means in the larger context of her ongoing co-morbidities.  Natural disease  trajectory of advanced cancer was discussed. Katharine Look verbalizes understanding that patient has an incurable disease. Reviewed that when patient was seen by oncology on 7/12, it was discussed that unfortunately patient is not a candidate for systemic chemotherapy. She had previously been on tamoxifen but was switched to a simpler oral chemo (anastrozole). Reviewed that oral chemo can potentially extend her life but does not necessarily improve quality of life.   I expressed concern to Katharine Look that at this point, patient appears to be imminently approaching EOL. Discussed that she also appeared to be experiencing discomfort. Katharine Look inquires whether patient can continue the oral chemotherapy at this point. I explained to Katharine Look that patient is currently unresponsive and unable to take anything by mouth.   The difference between full scope medical intervention and comfort care was considered.  I reviewed the concept of a comfort path, emphasizing that this path involves de-escalating and stopping full scope medical interventions, allowing a natural course to occur. Discussed that the goal is comfort and dignity rather than cure/prolonging life.   Explained that Katharine Look would need to make a decision between admission with continued work-up and treatment versus transition to comfort care and transfer to residential hospice. Discussed that even with full scope  medical interventions, it was unlikely for patient to survive this hospitalization. Katharine Look verbalizes understanding. She states she doesn't want patient to suffer and just wants her to be comfortable at this point. She would prefer transfer to the hospice facility in [facility].   Additional time was spent coordinating care and communicating with ED physician, TOC, bedside RN, and hospice liaison.   Primary decision maker: [name] (cousin and legal guardian)    SUMMARY OF RECOMMENDATIONS   Full comfort measures initiated DNR/DNI as previously  documented [facility] hosp[facility] bed available today PRN morphine ordered for pain or dyspnea   Code Status/Advance Care Planning: DNR  Palliative Prophylaxis:  Oral Care and Turn Reposition  Additional Recommendations (Limitations, Scope, Preferences): Full Comfort Care  Psycho-social/Spiritual:  Created space and opportunity for family to express thoughts and feelings regarding patient's current medical situation.  Emotional support provided   Prognosis:  days  Discharge Planning: Hospice facility      Primary Diagnoses: Present on Admission: **None**   I have reviewed the medical record, interviewed the patient and family, and examined the patient. The following aspects are pertinent.  Past Medical History:  Diagnosis Date   Arthritis    Cancer ([place])    endometrial   Constipation    Diabetes mellitus without complication ([place])    takes metformin   Frequent falls    Gait disorder    Incontinence of bowel    Incontinence of urine    Intellectual disability    severe intellectual disability, non-verbal   Osteopenia    Seizures ([place])    remote history; possible febrile childhood seizure    No family history on file. Scheduled Meds:  ipratropium-albuterol  3 mL Nebulization Once   Continuous Infusions: PRN Meds:.morphine injection   Allergies  Allergen Reactions   Lactose Intolerance (Gi) Other (See Comments)    On nursing home records   Bacillus Other (See Comments)    UNSPECIFIED REACTION    Ppd [Tuberculin Purified Protein Derivative] Other (See Comments)    UNSPECIFIED REACTION  On nursing home records   Review of Systems  Unable to perform ROS: Patient unresponsive   Physical Exam Vitals reviewed.  Constitutional:      Appearance: She is ill-appearing.  Cardiovascular:     Rate and Rhythm: Tachycardia present.  Pulmonary:     Effort: Tachypnea present.  Neurological:     Mental Status: She is unresponsive.     Vital Signs: BP (!) 129/103   Pulse (!) 136   Temp (!) 96.9 F (36.1 C) (Axillary)   Resp (!) 23   SpO2 99%         SpO2: SpO2: 99 % O2 Device:SpO2: 99 % O2 Flow Rate: .O2 Flow Rate (L/min): 2 L/min   Palliative Assessment/Data: PPS 10%     Time In: 1500 Time Out: 1612 Time Total: 72 minutes Greater than 50%  of this time was spent counseling and coordinating care related to the above assessment and plan.  Signed by: Lavena Bullion, NP   Please contact Palliative Medicine Team phone at [phone] for questions and concerns.  For individual provider: See Shea Evans

## 2020-10-05 NOTE — ED Triage Notes (Signed)
Pt arrived via [facility] from group home. EMS reports group home staff states pt is non verbal at baseline and began having appearance of increased respiratory distress today. EMS reports pt had wheezing in upper lung fields and diminished in lower and that they attempted to treat her with O2 but pt kept pulling off any mask or device they attempted to use. Hx stage 4 cancer with recent palliative consult however EMS advised group home states "they cannot handle a DNR." Pt alert on arrival to ED with tachypnea and restlessness.

## 2020-10-05 NOTE — ED Provider Notes (Signed)
[facility] EMERGENCY DEPARTMENT Provider Note   CSN: [id number] Arrival date & time: 10/05/20  1418     History Chief Complaint  Patient presents with   Shortness of Breath    Paula Cantu is a 75 y.o. female.  75 year old female with prior medical history as detailed below presents for evaluation.  Patient is nonverbal at baseline.  Level 5 caveat secondary to same.  Patient was seen yesterday at [facility] for similar complaint.  Patient is a DNR.  Patient's group home reports that the patient had increased wheezing and apparent shortness of breath today.  Patient was referred to hospice for an outpatient evaluation yesterday.  This hospice evaluation has not yet occurred.  Patient's case discussed with [name], patient's legal guardian.  She reports that the group home is not comfortable with keeping a progressively ill patient who is a DNR.  There was a tentative plan to obtain a hospice bed for this patient.  Hospice evaluation has not yet occurred.  The history is provided by the patient, medical records and a relative.  Shortness of Breath Severity:  Unable to specify Onset quality:  Unable to specify Timing:  Unable to specify Progression:  Unable to specify     Past Medical History:  Diagnosis Date   Arthritis    Cancer ([place])    endometrial   Constipation    Diabetes mellitus without complication ([place])    takes metformin   Frequent falls    Gait disorder    Incontinence of bowel    Incontinence of urine    Intellectual disability    severe intellectual disability, non-verbal   Osteopenia    Seizures ([place])    remote history; possible febrile childhood seizure    Patient Active Problem List   Diagnosis Date Noted   Pneumonia due to COVID-19 virus 03/15/2020   Acute hypoxemic respiratory failure due to COVID-19 ([place]) 03/14/2020   Sinus tachycardia 03/14/2020   Acute encephalopathy 03/14/2020   Malignant neoplasm of overlapping  sites of left breast in female, estrogen receptor positive ([place]) 11/09/2018   Mental disability 11/24/2017   Falling 08/12/2017   Idiopathic intellectual disability 04/13/2013    Past Surgical History:  Procedure Laterality Date   ABDOMINAL HYSTERECTOMY     BREAST LUMPECTOMY Left 03/04/2013   Procedure: LEFT BREAST LUMPECTOMY;  Surgeon: [name], MD;  Location: [place];  Service: General;  Laterality: Left;   RADIOLOGY WITH ANESTHESIA N/A 09/16/2016   Procedure: RADIOLOGY WITH ANESTHESIA MRI OF THE BRAIN WITH AND WITHOUT;  Surgeon: Radiologist, Medication, MD;  Location: [place];  Service: Radiology;  Laterality: N/A;   RADIOLOGY WITH ANESTHESIA N/A 09/15/2017   Procedure: MRI WITH ANESTHESIA OF THE BRAIN WITH AND WITHOUT AND CERVICAL WITHOUT CONTRAST;  Surgeon: Radiologist, Medication, MD;  Location: [place];  Service: Radiology;  Laterality: N/A;     OB History   No obstetric history on file.     No family history on file.  Social History   Tobacco Use   Smoking status: Never   Smokeless tobacco: Never  Vaping Use   Vaping Use: Never used  Substance Use Topics   Alcohol use: No   Drug use: No    Home Medications Prior to Admission medications   Medication Sig Start Date End Date Taking? Authorizing Provider  acetaminophen (TYLENOL) 325 MG tablet Take 2 tablets (650 mg total) by mouth every 6 (six) hours as needed for mild pain (or Fever >/=  101). 09/27/20   Cherene Altes, MD  anastrozole (ARIMIDEX) 1 MG tablet Take 1 tablet (1 mg total) by mouth daily. 09/28/20   Cherene Altes, MD  divalproex (DEPAKOTE SPRINKLES) 125 MG capsule Take 2 capsules (250 mg total) by mouth 2 (two) times daily. 09/27/20   Cherene Altes, MD  menthol-zinc oxide (GOLD BOND) powder Apply 1 application topically 2 (two) times daily. Apply under breasts 09/27/20   Cherene Altes, MD  promethazine (PHENERGAN) 25 MG suppository Place 1 suppository (25 mg total) rectally every 4 (four)  hours as needed for vomiting. 09/27/20   Cherene Altes, MD  promethazine (PHENERGAN) 25 MG tablet Take 1 tablet (25 mg total) by mouth every 4 (four) hours as needed for vomiting. Notify md if symptoms persist over 12 hours 09/27/20   Cherene Altes, MD  sennosides-docusate sodium (SENOKOT-S) 8.6-50 MG tablet Take 2 tablets by mouth at bedtime. 09/27/20   Cherene Altes, MD    Allergies    Lactose intolerance (gi), Bacillus, and Ppd [tuberculin purified protein derivative]  Review of Systems   Review of Systems  Unable to perform ROS: Patient nonverbal  Respiratory:  Positive for shortness of breath.    Physical Exam Updated Vital Signs BP 134/87   Pulse (!) 136   Temp (!) 96.9 F (36.1 C) (Axillary)   Resp 14   SpO2 97%   Physical Exam Vitals and nursing note reviewed.  Constitutional:      General: She is not in acute distress.    Appearance: Normal appearance. She is well-developed.  HENT:     Head: Normocephalic and atraumatic.  Eyes:     Conjunctiva/sclera: Conjunctivae normal.     Pupils: Pupils are equal, round, and reactive to light.  Cardiovascular:     Rate and Rhythm: Normal rate and regular rhythm.     Heart sounds: Normal heart sounds.  Pulmonary:     Effort: Pulmonary effort is normal. Tachypnea present. No respiratory distress.     Comments: Diffuse expiratory wheezing in all lung fields Abdominal:     General: There is no distension.     Palpations: Abdomen is soft.     Tenderness: There is no abdominal tenderness.  Musculoskeletal:        General: No deformity. Normal range of motion.     Cervical back: Normal range of motion and neck supple.  Skin:    General: Skin is warm and dry.  Neurological:     General: No focal deficit present.     Mental Status: She is alert and oriented to person, place, and time.    ED Results / Procedures / Treatments   Labs (all labs ordered are listed, but only abnormal results are displayed) Labs  Reviewed  RESP PANEL BY RT-PCR (FLU A&B, COVID) ARPGX2  COMPREHENSIVE METABOLIC PANEL  CBC WITH DIFFERENTIAL/PLATELET  VALPROIC ACID LEVEL  I-STAT CHEM 8, ED  I-STAT ARTERIAL BLOOD GAS, ED    EKG None  Radiology DG Chest Port 1 View  Result Date: 10/04/2020 CLINICAL DATA:  Shortness of breath and cough. Image guided thoracentesis on 09/24/2020. EXAM: PORTABLE CHEST 1 VIEW COMPARISON:  09/21/2020 FINDINGS: Again noted are densities in the right lower chest compatible with volume loss and probably pleural fluid. Negative for pneumothorax. Left lung is clear. Heart and mediastinum are stable and within normal limits. IMPRESSION: Residual volume loss and densities in the right lower chest. Findings are most likely related to right pleural effusion and  atelectasis. Overall, the aeration in the right lung is better than it was on 09/21/2020, prior to the thoracentesis. Electronically Signed   By: Markus Daft M.D.   On: 10/04/2020 11:00    Procedures Procedures   Medications Ordered in ED Medications  ipratropium-albuterol (DUONEB) 0.5-2.5 (3) MG/3ML nebulizer solution 3 mL (has no administration in time range)  methylPREDNISolone sodium succinate (SOLU-MEDROL) 125 mg/2 mL injection 125 mg (has no administration in time range)  LORazepam (ATIVAN) injection 0.5 mg (0.5 mg Intramuscular Given 10/05/20 1430)  ipratropium-albuterol (DUONEB) 0.5-2.5 (3) MG/3ML nebulizer solution (  Given 10/05/20 1449)    ED Course  I have reviewed the triage vital signs and the nursing notes.  Pertinent labs & imaging results that were available during my care of the patient were reviewed by me and considered in my medical decision making (see chart for details).    MDM Rules/Calculators/A&P                           MDM  MSE complete  Paula Cantu was evaluated in Emergency Department on 10/05/2020 for the symptoms described in the history of present illness. She was evaluated in the context of the  global COVID-19 pandemic, which necessitated consideration that the patient might be at risk for infection with the SARS-CoV-2 virus that causes COVID-19. Institutional protocols and algorithms that pertain to the evaluation of patients at risk for COVID-19 are in a state of rapid change based on information released by regulatory bodies including the CDC and federal and state organizations. These policies and algorithms were followed during the patient's care in the ED.  Patient is presenting from her group home for apparent shortness of breath and wheezing.  Was seen yesterday for [facility] complaints [facility].  Patient with well-established history of metastatic cancer.  Patient is a DNR.  This was confirmed with patient's legal guardian.  Patient has not been evaluated yet for hospice bed placement.  Patient's legal guardian is concerned that the patient's group home is not comfortable with care of a DNR/DNI patient with worsening symptoms that could lead to imminent expiration.   Palliative care consult initiated here in the ED.  Patient will be seen by palliative care in the ED with possible hospice bed placement this afternoon/evening.  Pending disposition signed out to Dr. Ashok Cordia.    Final Clinical Impression(s) / ED Diagnoses Final diagnoses:  Dyspnea, unspecified type    Rx / DC Orders ED Discharge Orders     None        Valarie Merino, MD 10/06/20 [phone]

## 2020-10-05 NOTE — Progress Notes (Addendum)
5pm: CSW received return call from [facility] who states patient can be transferred to the facility at this time. Facility address is [address] [place], [place] [address]. The number to call for report is [phone].  CSW arranged for pickup by [facility] as first available.  CSW notified RN and MD of information.  4:10pm: CSW spoke with Carmel Sacramento, liaison for [facility] hospice who states she is working on this patient and will present her information to the MD for review. Carmel Sacramento will return call to CSW once patient can be transferred to the facility.  Madilyn Fireman, MSW, LCSW Transitions of Care  Clinical Social Worker II [phone]

## 2020-10-05 NOTE — Discharge Instructions (Addendum)
Transport patient to [facility] in [place] via [place]

## 2020-10-05 NOTE — ED Notes (Signed)
Per Jenny Reichmann at [facility], request to keep IV in as it will be used for medication management. Per [facility], frequently done for [facility] [place] to transport with IV intact.

## 2020-10-15 DEATH — deceased

## 2020-10-30 ENCOUNTER — Other Ambulatory Visit: Payer: Medicare Other

## 2020-10-30 ENCOUNTER — Ambulatory Visit (HOSPITAL_BASED_OUTPATIENT_CLINIC_OR_DEPARTMENT_OTHER): Payer: Medicare Other | Admitting: Oncology

## 2020-10-30 ENCOUNTER — Telehealth: Payer: Self-pay | Admitting: *Deleted

## 2020-10-30 DIAGNOSIS — Z170 Estrogen receptor positive status [ER+]: Secondary | ICD-10-CM

## 2020-10-30 DIAGNOSIS — C50812 Malignant neoplasm of overlapping sites of left female breast: Secondary | ICD-10-CM

## 2020-10-30 NOTE — Progress Notes (Signed)
Ms. Paula Cantu was supposed to see me today.  I called her healthcare power of attorney Paula Cantu to discuss the overall situation and Paula Cantu told me it is actually died under hospice care in [facility] 2 weeks ago.  I expressed our sympathy.

## 2020-10-30 NOTE — Telephone Encounter (Signed)
Per telephone call to pt's daughter - per appointment today - informed by the daughter post discharge from the hospital ( when Dr Jannifer Rodney saw her ) she went to a SNF with goal for hospice services to be followed.  Post 2 additional ER visits per the SNF pt was transferred to the [facility] in [facility] and passed away on 808-21-2022.  Per Epic no notice posted of pt's death.  This note will be forwarded to appropriate staff to accommodate proper documentation of death.

## 2020-11-15 DEATH — deceased

## 2022-11-04 IMAGING — CT CT HEAD W/O CM
4 series · 15 of 47 positions shown, 17 images · non-contrast
Comparison: 08/30/2017

CLINICAL DATA: Transient ischemic attack, aphasia

EXAM:
CT HEAD WITHOUT CONTRAST
TECHNIQUE: Contiguous axial images were obtained from the base of the skull
through the vertex without intravenous contrast.

[Series 3: head without ax · axial · non-contrast · 0.31mm/px · z∈[-143,-35]mm · 6 of 32 slices shown, 8 images]
[im 5/32  brain]
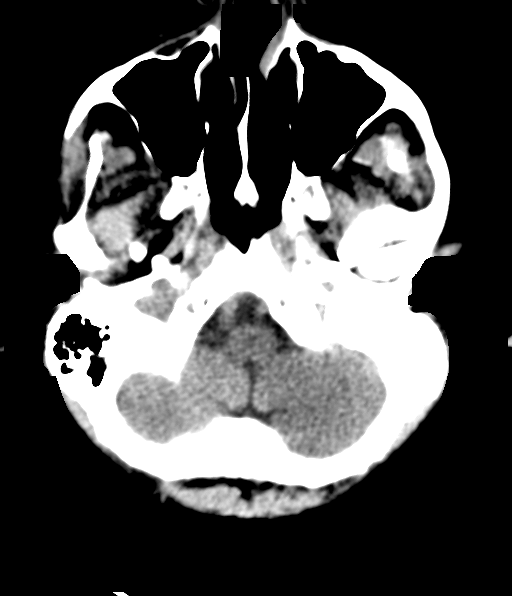
[im 5/32  bone]
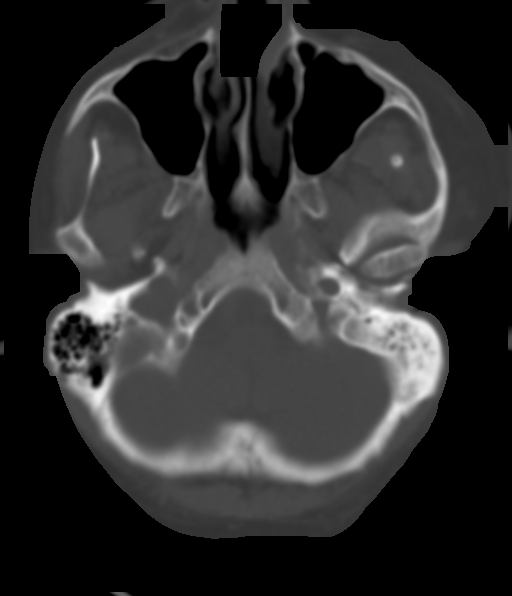
[im 9/32  brain]
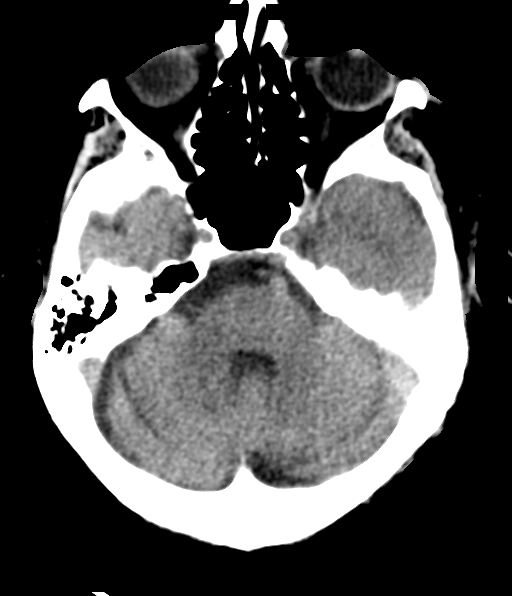
[im 14/32  brain]
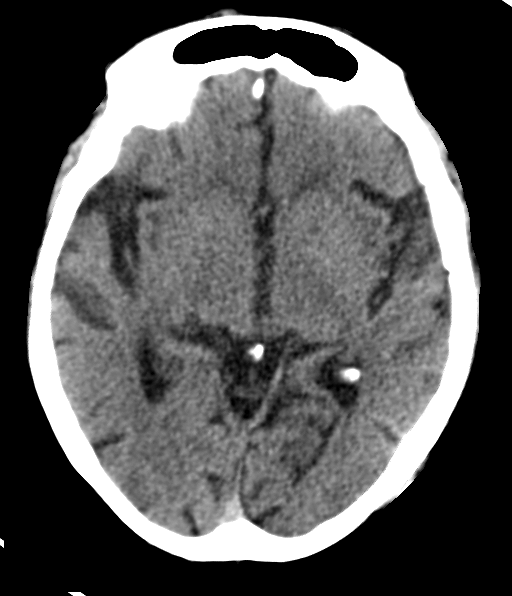
[im 18/32  brain]
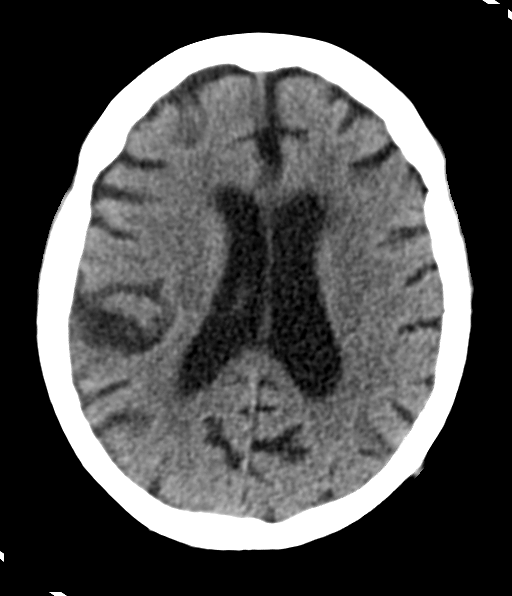
[im 23/32  brain]
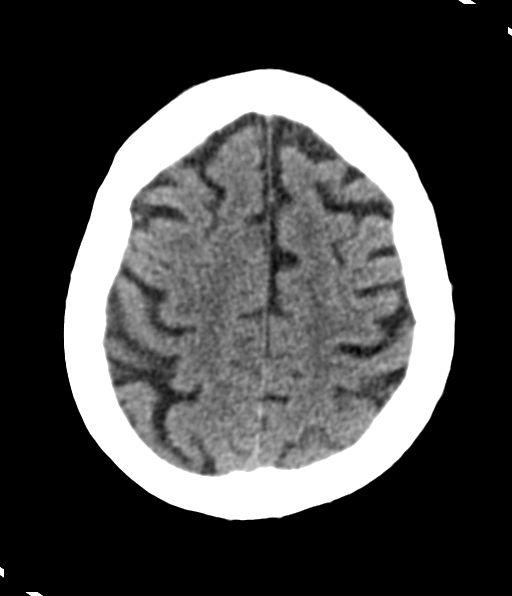
[im 23/32  bone]
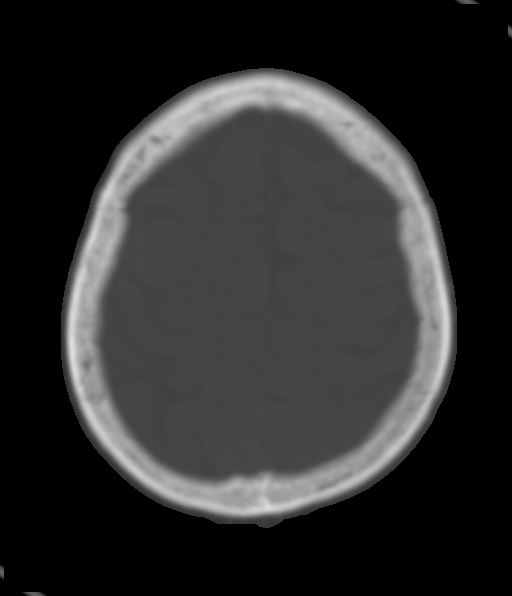
[im 27/32  brain]
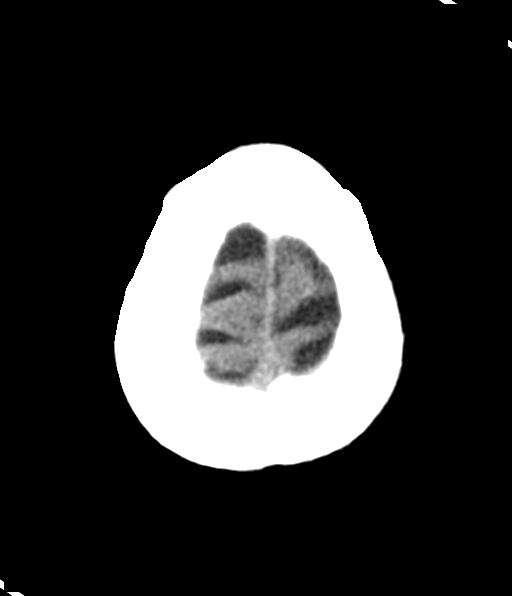

[Series 4: ax head bone · axial · 0.35mm/px · z∈[-148,-109]mm · 3 of 82 slices shown]
[im 8/82  bone]
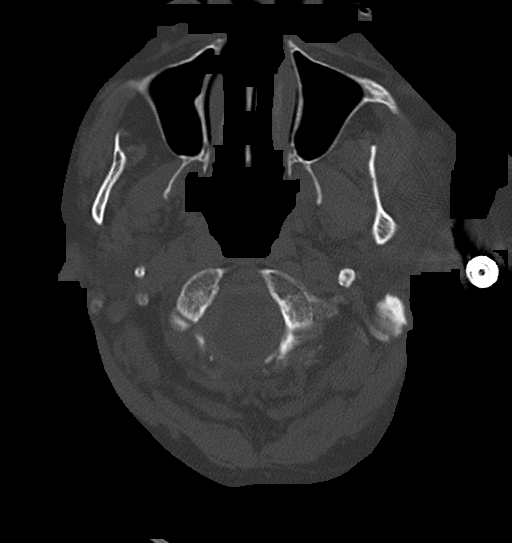
[im 16/82  bone]
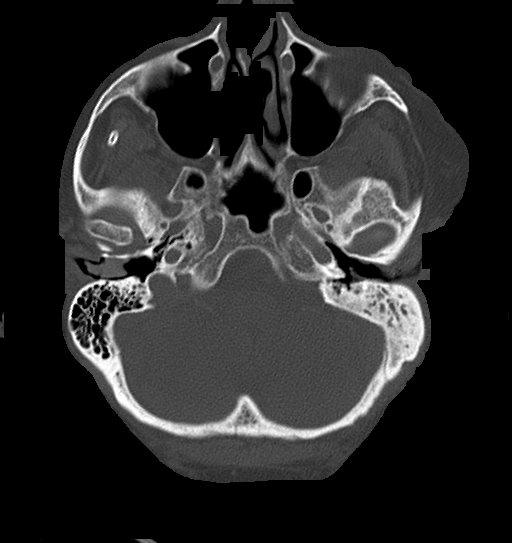
[im 28/82  bone]
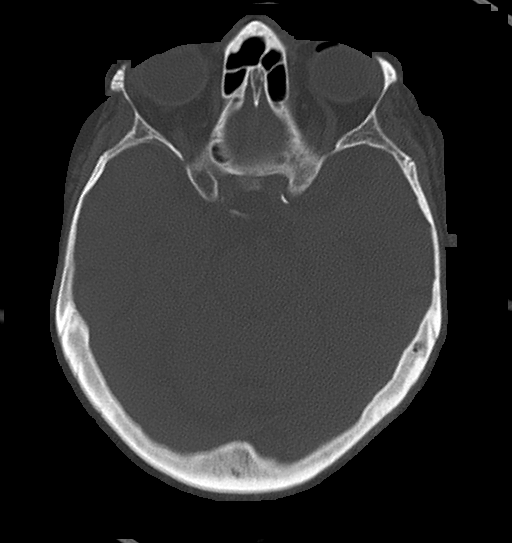

[Series 6: head without sag · sagittal · non-contrast · 0.32mm/px · 3 of 67 slices shown]
[im 23/67  brain]
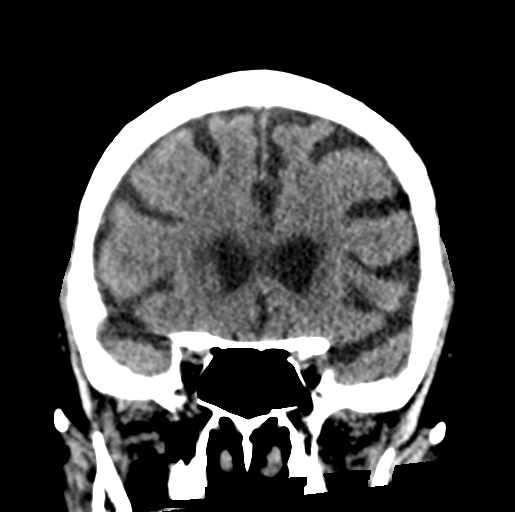
[im 34/67  brain]
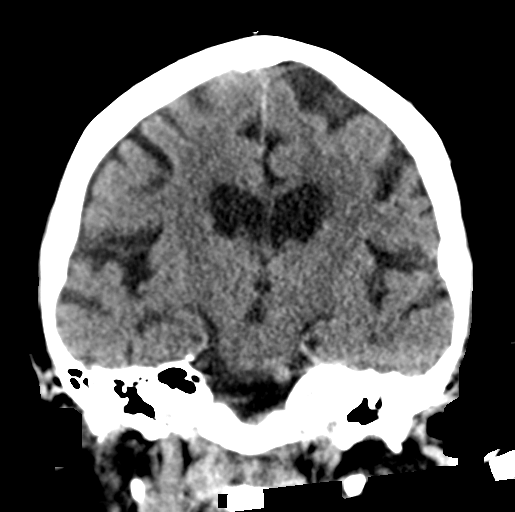
[im 45/67  brain]
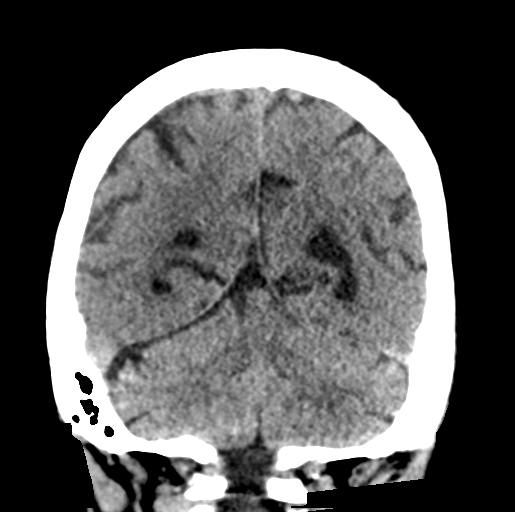

[Series 7: head without cor · coronal · non-contrast · 0.32mm/px · 3 of 56 slices shown]
[im 19/56  brain]
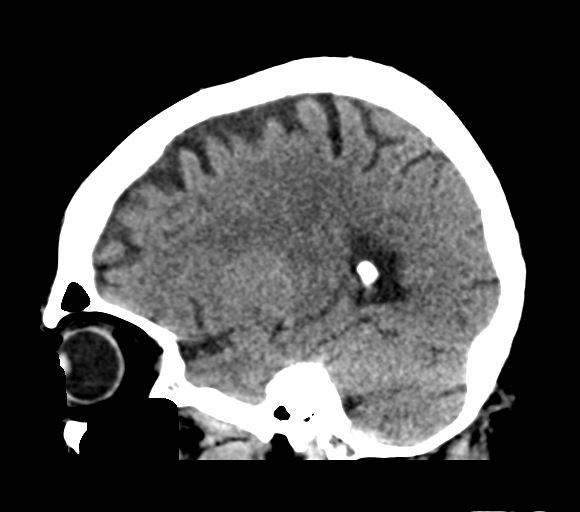
[im 25/56  brain]
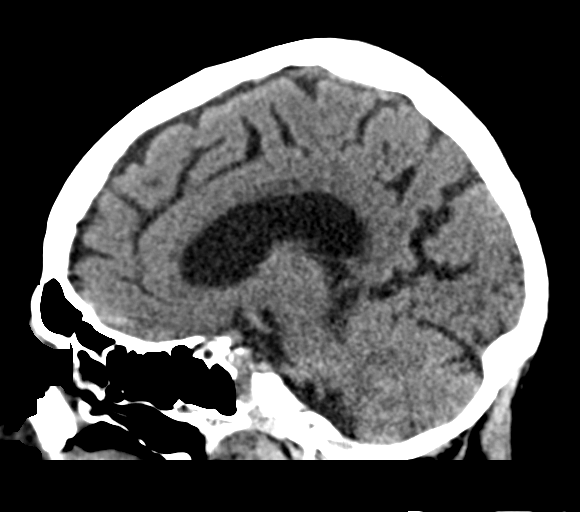
[im 31/56  brain]
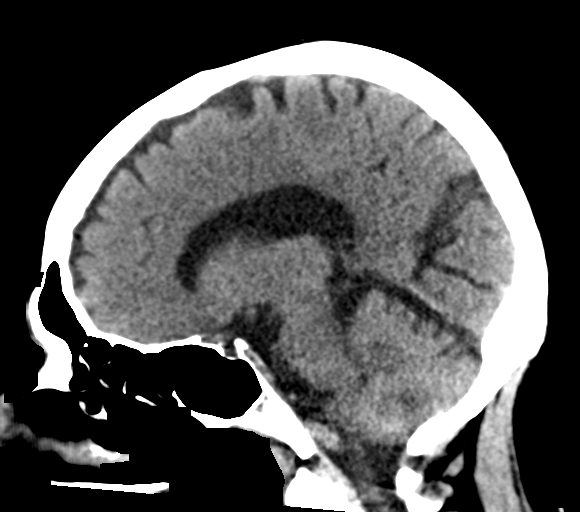

[15 of 47 positions shown; findings below may reference images not displayed]

FINDINGS: Brain: Normal anatomic configuration. Parenchymal volume loss is
commensurate with the patient's age. Mild periventricular white
matter changes are present likely reflecting the sequela of small
vessel ischemia. No abnormal intra or extra-axial mass lesion or
fluid collection. No abnormal mass effect or midline shift. No
evidence of acute intracranial hemorrhage or infarct. Ventricular
size is normal. Cerebellum unremarkable.

Vascular: No asymmetric hyperdense vasculature at the skull base.

Skull: Intact

Sinuses/Orbits: Paranasal sinuses are clear. Orbits are
unremarkable.

Other: There is dense opacification of the left mastoid air cells as
well as fluid within the left middle ear cavity. There is sclerosis
and effacement of the mastoid air cells suggesting chronic process.
Together, the findings are suggestive changes of chronic
otomastoiditis. No cortical erosion.
IMPRESSION: No evidence of acute intracranial hemorrhage or infarct.

Mild senescent changes.

Changes suggesting chronic left otomastoiditis. Clinical correlation
is suggested.
